# Patient Record
Sex: Male | Born: 1975 | Race: White | Hispanic: No | Marital: Married | Smoking: Never smoker
Health system: Southern US, Community
[De-identification: ages and names within clinical notes are randomized; demographics above are authoritative.]

## PROBLEM LIST (undated history)

## (undated) DIAGNOSIS — N200 Calculus of kidney: Secondary | ICD-10-CM

## (undated) DIAGNOSIS — G4730 Sleep apnea, unspecified: Secondary | ICD-10-CM

## (undated) DIAGNOSIS — Z87442 Personal history of urinary calculi: Secondary | ICD-10-CM

## (undated) DIAGNOSIS — T7840XA Allergy, unspecified, initial encounter: Secondary | ICD-10-CM

## (undated) DIAGNOSIS — I10 Essential (primary) hypertension: Secondary | ICD-10-CM

## (undated) DIAGNOSIS — E119 Type 2 diabetes mellitus without complications: Secondary | ICD-10-CM

## (undated) DIAGNOSIS — J189 Pneumonia, unspecified organism: Secondary | ICD-10-CM

## (undated) HISTORY — PX: MRI: SHX5353

## (undated) HISTORY — PX: WISDOM TOOTH EXTRACTION: SHX21

## (undated) HISTORY — DX: Allergy, unspecified, initial encounter: T78.40XA

## (undated) HISTORY — DX: Type 2 diabetes mellitus without complications: E11.9

---

## 1997-12-10 ENCOUNTER — Encounter: Admission: RE | Admit: 1997-12-10 | Discharge: 1998-03-10 | Payer: Self-pay | Admitting: Internal Medicine

## 1998-06-18 HISTORY — PX: OTHER SURGICAL HISTORY: SHX169

## 2000-02-11 ENCOUNTER — Emergency Department (HOSPITAL_COMMUNITY): Admission: EM | Admit: 2000-02-11 | Discharge: 2000-02-11 | Payer: Self-pay | Admitting: Internal Medicine

## 2001-06-18 HISTORY — PX: ANKLE SURGERY: SHX546

## 2001-11-13 ENCOUNTER — Inpatient Hospital Stay (HOSPITAL_COMMUNITY): Admission: EM | Admit: 2001-11-13 | Discharge: 2001-11-15 | Payer: Self-pay | Admitting: Emergency Medicine

## 2001-11-13 ENCOUNTER — Encounter: Payer: Self-pay | Admitting: Orthopedic Surgery

## 2001-11-13 ENCOUNTER — Encounter: Payer: Self-pay | Admitting: Emergency Medicine

## 2002-01-24 ENCOUNTER — Emergency Department (HOSPITAL_COMMUNITY): Admission: EM | Admit: 2002-01-24 | Discharge: 2002-01-24 | Payer: Self-pay | Admitting: Emergency Medicine

## 2002-01-24 ENCOUNTER — Encounter: Payer: Self-pay | Admitting: *Deleted

## 2002-02-13 ENCOUNTER — Ambulatory Visit: Admission: RE | Admit: 2002-02-13 | Discharge: 2002-02-13 | Payer: Self-pay | Admitting: Orthopedic Surgery

## 2002-04-20 ENCOUNTER — Ambulatory Visit (HOSPITAL_COMMUNITY): Admission: RE | Admit: 2002-04-20 | Discharge: 2002-04-20 | Payer: Self-pay | Admitting: *Deleted

## 2002-04-20 ENCOUNTER — Encounter: Payer: Self-pay | Admitting: *Deleted

## 2002-06-04 ENCOUNTER — Encounter: Payer: Self-pay | Admitting: Urology

## 2002-06-04 ENCOUNTER — Ambulatory Visit (HOSPITAL_BASED_OUTPATIENT_CLINIC_OR_DEPARTMENT_OTHER): Admission: RE | Admit: 2002-06-04 | Discharge: 2002-06-04 | Payer: Self-pay | Admitting: Urology

## 2003-06-23 ENCOUNTER — Emergency Department (HOSPITAL_COMMUNITY): Admission: AD | Admit: 2003-06-23 | Discharge: 2003-06-23 | Payer: Self-pay | Admitting: Emergency Medicine

## 2003-12-10 ENCOUNTER — Emergency Department (HOSPITAL_COMMUNITY): Admission: EM | Admit: 2003-12-10 | Discharge: 2003-12-10 | Payer: Self-pay | Admitting: Emergency Medicine

## 2005-06-18 HISTORY — PX: OTHER SURGICAL HISTORY: SHX169

## 2005-08-29 ENCOUNTER — Encounter: Admission: RE | Admit: 2005-08-29 | Discharge: 2005-09-20 | Payer: Self-pay | Admitting: Family Medicine

## 2006-09-01 ENCOUNTER — Emergency Department (HOSPITAL_COMMUNITY): Admission: EM | Admit: 2006-09-01 | Discharge: 2006-09-01 | Payer: Self-pay | Admitting: Family Medicine

## 2008-05-29 ENCOUNTER — Emergency Department (HOSPITAL_COMMUNITY): Admission: EM | Admit: 2008-05-29 | Discharge: 2008-05-29 | Payer: Self-pay | Admitting: Emergency Medicine

## 2008-06-18 HISTORY — PX: LITHOTRIPSY: SUR834

## 2009-04-10 ENCOUNTER — Emergency Department (HOSPITAL_COMMUNITY): Admission: EM | Admit: 2009-04-10 | Discharge: 2009-04-10 | Payer: Self-pay | Admitting: Family Medicine

## 2009-04-10 ENCOUNTER — Emergency Department (HOSPITAL_COMMUNITY): Admission: EM | Admit: 2009-04-10 | Discharge: 2009-04-10 | Payer: Self-pay | Admitting: Emergency Medicine

## 2009-08-03 ENCOUNTER — Emergency Department (HOSPITAL_COMMUNITY): Admission: EM | Admit: 2009-08-03 | Discharge: 2009-08-03 | Payer: Self-pay | Admitting: Emergency Medicine

## 2010-09-06 LAB — URINALYSIS, ROUTINE W REFLEX MICROSCOPIC
Glucose, UA: NEGATIVE mg/dL
Ketones, ur: 15 mg/dL — AB
Specific Gravity, Urine: 1.025 (ref 1.005–1.030)
Urobilinogen, UA: 1 mg/dL (ref 0.0–1.0)

## 2010-09-06 LAB — URINE MICROSCOPIC-ADD ON

## 2010-11-03 NOTE — Consult Note (Signed)
[place]. [facility]  Patient:    Cody Sullivan, Cody Sullivan Visit Number: [id number] MRN: [id number]          Service Type: MED Location: 5000 [id number] Attending Physician:  Burnard Bunting Dictated by:   Cammy Copa, M.D. Admit Date:  11/13/2001                            Consultation Report  CONSULTATION REQUESTED BY:  Dr. Doug Sou.  CHIEF COMPLAINT:  Right ankle pain.  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  He reports right ankle pain.  Denies any other orthopedic complaints.  He last ate at 0900.  PAST MEDICAL HISTORY:  Unremarkable.  MEDICATIONS:  Currently, no medications.  ALLERGIES:  None.  PAST SURGICAL HISTORY:  Past surgical history is significant for nose surgery several years ago.  PHYSICAL EXAMINATION:  GENERAL:  He is alert and oriented x3, in moderate distress.  CHEST:  Clear to auscultation.  HEART:  Heart beat is regular rhythm.  ABDOMEN:  Exam is benign.  EXTREMITIES:  Bilateral upper extremities have full range of motion.  The left lower extremity has good hip, knee and ankle range of motion with palpable pedal pulse and intact sensation on the dorsal and plantar aspects of the foot.  Right lower extremity:  Knee has no effusion.  Right ankle has deformity.  Sensation is okay on the dorsal and plantar aspects of the foot, although slightly diminished dorsally.  The dorsalis pedis pulse is 1+/4.  X-RAY FINDINGS:  X-rays show trimalleolar ankle fracture on the right with syndesmotic disruption.  IMPRESSION:  Right trimalleolar ankle fracture.  PLAN:  ORIF, medial and lateral, with syndesmotic fixation.  Risks and benefits are discussed with the patient and his wife; primary risks include infection, nerve and vessel damage, bleeding, deep venous thrombosis, hardware failure, need for more surgery.  The patient understands the risks and benefits and will proceed  with surgery. Dictated by:   Cammy Copa, M.D. Attending Physician:  Burnard Bunting DD:  11/12/01 TD:  11/14/01 Job: [phone]

## 2010-11-03 NOTE — Op Note (Signed)
[place]. [facility]  Patient:    Cody Sullivan, Cody Sullivan Visit Number: [id number] MRN: [id number]          Service Type: MED Location: 5000 [id number] [id number] Attending Physician:  Burnard Bunting Dictated by:   Cammy Copa, M.D. Proc. Date: 11/13/01 Admit Date:  11/13/2001 Discharge Date: 11/15/2001                             Operative Report  PREOPERATIVE DIAGNOSIS:  Right ankle trimalleolar fracture.  POSTOPERATIVE DIAGNOSIS:  Right ankle trimalleolar fracture.  PROCEDURE:  Closed reduction of right ankle trimalleolar fracture dislocation with open reduction and internal fixation of fibular fracture and medial malleolar fracture.  SURGEON:  Cammy Copa, M.D.  ANESTHESIA:  General endotracheal.  ESTIMATED BLOOD LOSS:  25 cc.  DRAINS:  Hemovac x1 on the lateral side.  TOURNIQUET TIME:  2 hours 11 minutes at 300 mmHg. It should be noted that the tourniquet was requested to be decreased at approximately 1 hour and 30 minutes. However, it was not released until approximately 2 hours.  PROCEDURE IN DETAIL:  The patient was brought to the operating room where general endotracheal anesthesia was induced. Preoperative antibiotics were administered. The ankle was then closed reduced. This production was confirmed in the AP and lateral planes under fluoroscopy. Foot was then prepped with Duraprep solution and draped in a sterile manner. The leg was elevated in the same area of the Esmarch graft. The tourniquet was inflated. Lateral incision was then first made. Skin and subcutaneous tissues were sharply divided. Superficial peroneal nerve was identified, immobilized anteriorly. The fibula fracture itself was a long spiral oblique fracture with probable disruption of the syndesmosis that required a longer incision. The fracture was identified. The fracture edges were visualized and the fracture itself was reduced and maintained with clamps. Good reduction was  confirmed in the AP and lateral planes. Two lag screws were placed across the fracture with anatomic reduction achieved. This was then reinforced with a one-third locking tubular plate. A syndesmotic screw was also placed through one of the holes of the plate. This was a bicortical 3.5 mm screw. Following anatomic reduction of the lateral malleolus, the incision was irrigated and closed using 3-0 Vicryl to reapproximate fascia over the plate and to reapproximate the skin edges in inverted fashion followed by 3-0 nylon to reapproximate the skin edges. A drain was placed within the lateral incision. This time a medial incision was made, and the medial malleolus was anatomically reduced and then secured with two 4.0 mm cannulated 50 mm partially threaded cancellous screws. Again, anatomic fixation was achieved. There was a posterior malleolar fragment which was less than 1 mm displaced thus, fixation of the fragment was not necessary. At this time tourniquet was released. Bleeding points encountered with ______ electrocautery. Medial side was closed using interrupted inverted 3-0 Vicryl and then followed by 3-0 nylon in the skin edges. Bulky posterior splint was applied. The patient tolerated the procedure without immediate complications. He was transferred to the recovery room in stable condition. Dictated by:   Cammy Copa, M.D. Attending Physician:  Burnard Bunting DD:  11/16/01 TD:  11/17/01 Job: [id number]

## 2011-03-22 LAB — URINALYSIS, ROUTINE W REFLEX MICROSCOPIC
Ketones, ur: 15 mg/dL — AB
Protein, ur: 100 mg/dL — AB

## 2011-03-22 LAB — CBC
HCT: 41.8 % (ref 39.0–52.0)
Hemoglobin: 14 g/dL (ref 13.0–17.0)
Platelets: 264 10*3/uL (ref 150–400)
RBC: 4.64 MIL/uL (ref 4.22–5.81)
RDW: 13.5 % (ref 11.5–15.5)
WBC: 10.5 10*3/uL (ref 4.0–10.5)

## 2011-03-22 LAB — BASIC METABOLIC PANEL
GFR calc Af Amer: >60 mL/min (ref >60)
GFR calc non Af Amer: >60 mL/min (ref >60)
Glucose, Bld: 104 mg/dL — ABNORMAL HIGH (ref 70–99)
Potassium: 3.9 mEq/L (ref 3.5–5.1)
Sodium: 135 mEq/L (ref 135–145)

## 2011-03-22 LAB — DIFFERENTIAL
Lymphs Abs: 2.6 10*3/uL (ref 0.7–4.0)
Monocytes Absolute: 0.6 10*3/uL (ref 0.1–1.0)
Monocytes Relative: 5 % (ref 3–12)

## 2011-03-22 LAB — URINE MICROSCOPIC-ADD ON

## 2012-01-11 ENCOUNTER — Ambulatory Visit: Payer: Self-pay | Admitting: *Deleted

## 2012-03-26 DIAGNOSIS — E66812 Obesity, class 2: Secondary | ICD-10-CM | POA: Insufficient documentation

## 2012-03-26 DIAGNOSIS — E669 Obesity, unspecified: Secondary | ICD-10-CM | POA: Insufficient documentation

## 2012-11-19 DIAGNOSIS — L237 Allergic contact dermatitis due to plants, except food: Secondary | ICD-10-CM | POA: Insufficient documentation

## 2013-02-25 DIAGNOSIS — I10 Essential (primary) hypertension: Secondary | ICD-10-CM | POA: Insufficient documentation

## 2013-02-25 DIAGNOSIS — E785 Hyperlipidemia, unspecified: Secondary | ICD-10-CM | POA: Insufficient documentation

## 2013-09-05 ENCOUNTER — Emergency Department: Payer: Self-pay | Admitting: Internal Medicine

## 2014-12-22 ENCOUNTER — Ambulatory Visit (INDEPENDENT_AMBULATORY_CARE_PROVIDER_SITE_OTHER): Payer: BLUE CROSS/BLUE SHIELD | Admitting: Family Medicine

## 2014-12-22 VITALS — BP 154/86 | HR 80 | Temp 97.8°F | Resp 18 | Ht 69.5 in | Wt 289.0 lb

## 2014-12-22 DIAGNOSIS — R51 Headache: Secondary | ICD-10-CM

## 2014-12-22 DIAGNOSIS — T679XXA Effect of heat and light, unspecified, initial encounter: Secondary | ICD-10-CM

## 2014-12-22 DIAGNOSIS — R42 Dizziness and giddiness: Secondary | ICD-10-CM

## 2014-12-22 DIAGNOSIS — R079 Chest pain, unspecified: Secondary | ICD-10-CM | POA: Diagnosis not present

## 2014-12-22 DIAGNOSIS — R519 Headache, unspecified: Secondary | ICD-10-CM

## 2014-12-22 DIAGNOSIS — G4733 Obstructive sleep apnea (adult) (pediatric): Secondary | ICD-10-CM

## 2014-12-22 DIAGNOSIS — E669 Obesity, unspecified: Secondary | ICD-10-CM | POA: Diagnosis not present

## 2014-12-22 DIAGNOSIS — R030 Elevated blood-pressure reading, without diagnosis of hypertension: Secondary | ICD-10-CM | POA: Diagnosis not present

## 2014-12-22 LAB — POCT CBC
GRANULOCYTE PERCENT: 69.4 % (ref 37–80)
HCT, POC: 40.9 % — AB (ref 43.5–53.7)
HEMOGLOBIN: 13.7 g/dL — AB (ref 14.1–18.1)
Lymph, poc: 2.9 (ref 0.6–3.4)
MCH: 28.5 pg (ref 27–31.2)
MCHC: 33.5 g/dL (ref 31.8–35.4)
MCV: 85.1 fL (ref 80–97)
MID (cbc): 0.1 (ref 0–0.9)
MPV: 7.5 fL (ref 0–99.8)
PLATELET COUNT, POC: 247 10*3/uL (ref 142–424)
POC GRANULOCYTE: 6.8 (ref 2–6.9)
POC LYMPH PERCENT: 29.6 %L (ref 10–50)
POC MID %: 1 % (ref 0–12)
RBC: 4.81 M/uL (ref 4.69–6.13)
RDW, POC: 13.9 %
WBC: 9.8 10*3/uL (ref 4.6–10.2)

## 2014-12-22 LAB — COMPLETE METABOLIC PANEL WITH GFR
ALBUMIN: 4.2 g/dL (ref 3.5–5.2)
ALT: 54 U/L — ABNORMAL HIGH (ref 0–53)
AST: 26 U/L (ref 0–37)
Alkaline Phosphatase: 71 U/L (ref 39–117)
BUN: 12 mg/dL (ref 6–23)
CALCIUM: 9.3 mg/dL (ref 8.4–10.5)
CHLORIDE: 102 meq/L (ref 96–112)
CO2: 25 mEq/L (ref 19–32)
Creat: 0.7 mg/dL (ref 0.50–1.35)
GFR, Est African American: >89 mL/min
GFR, Est Non African American: >89 mL/min
GLUCOSE: 98 mg/dL (ref 70–99)
POTASSIUM: 3.9 meq/L (ref 3.5–5.3)
SODIUM: 140 meq/L (ref 135–145)
TOTAL PROTEIN: 7.1 g/dL (ref 6.0–8.3)
Total Bilirubin: 0.5 mg/dL (ref 0.2–1.2)

## 2014-12-22 LAB — POCT GLYCOSYLATED HEMOGLOBIN (HGB A1C): Hemoglobin A1C: 6.2

## 2014-12-22 LAB — POCT SEDIMENTATION RATE: POCT SED RATE: 38 mm/hr — AB (ref 0–22)

## 2014-12-22 MED ORDER — KETOROLAC TROMETHAMINE 60 MG/2ML IM SOLN
60.0000 mg | Freq: Once | INTRAMUSCULAR | Status: AC
  Administered 2014-12-22: 60 mg via INTRAMUSCULAR

## 2014-12-22 MED ORDER — TRAMADOL HCL 50 MG PO TABS: 50.0000 mg | ORAL_TABLET | Freq: Three times a day (TID) | ORAL | Status: DC | PRN

## 2014-12-22 MED ORDER — METAXALONE 800 MG PO TABS: 800.0000 mg | ORAL_TABLET | Freq: Three times a day (TID) | ORAL | Status: DC

## 2014-12-22 MED ORDER — LISINOPRIL-HYDROCHLOROTHIAZIDE 10-12.5 MG PO TABS: 1.0000 | ORAL_TABLET | Freq: Every day | ORAL | Status: DC

## 2014-12-22 NOTE — Patient Instructions (Signed)
Take tramadol 50 mg 1 every 6 hours as needed for headache  Take Tylenol 1000 mg maximum of 3 times daily as needed for headache  The combination of Tylenol and tramadol actually worked very well together in making a more potent pain relief combination  Take Skelaxin (metaxalone) one pill 3 times daily only if needed for muscle relaxant for neck and temple pain  Take lisinopril HCT 10/12.5 one daily for blood pressure  Stay off work through the rest of this week and return Saturday or Sunday for a recheck of your blood pressure. We need to decide if you are doing well enough to feel like he should be driving.  Contact the cardiology office and get an appointment as soon as possible to follow-up on the history of intermittent chest pains over the past month.  Make sure you follow through with getting the sleep apnea testing done  Work hard on weight loss. This should be done by getting some regular daily exercise when you're feeling better, and by working hard on decreasing your intake as discussed.  Drink plenty of fluids each day, preferably water You should have your blood sugar testing refill and up on in about 3-4 months. By watching what you aren't eating you should've been able to lose 10 or more pounds by that time.

## 2014-12-22 NOTE — Progress Notes (Signed)
Subjective:  Patient ID: Cody Sullivan, male    DOB: Feb 16, 1976  Age: 39 y.o. MRN: [id number]  39 year old who presents with a headache for the last couple of days. He did go to work today but had to leave. 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 The headache came on a couple days ago, got bad yesterday, and has persisted. He has not taken any medications for this. He does not typically have headaches. He has had photophobia with it. Today he got lightheaded, but he was working out where it is hot. He has been having chest pains over the past month. They extend from the substernal and left chest area up into the left shoulder. He was seen at [place] and evaluated and told that it appeared to be a musculoskeletal chest pain. He has gotten the pain at times when he does vigorous physical labor such as lifting full  lines at work. He has a history of sleep apnea although he is not yet been diagnosed. He is scheduled to see somebody later this month regarding that. His wife reports apneic spells and extremely heavy snoring. He does not smoke, drink, or use drugs. He does not do regular physical exercise. He is married with a couple of children. He is not on any regular medications.  Recent visits were to [facility] and we do not have access to those records  His wife feels like he was mumbling and somewhat incoherent today when she talked to him on the phone, and again here in the office that he was not alert and with a program. The patient does not feel like she's been having any significant confusion. Objective:   White male laying on his back on the exam table covering the eyes to protect them from the like. His TMs are normal. Eyes PERRLA. EOMs intact. Pupils were a little constricted I cannot get a great view of his fundi, but red reflex was symmetrical bilaterally. Throat was clear. Neck supple without nodes or thyromegaly. No carotid bruits. Chest is clear to  auscultation. Heart regular without murmurs gallops or arrhythmias. His neck circumference was 18-3/4 inches. Abdomen is soft without mass or tenderness. He is obese. Extremities without edema.  Assessment & Plan:   Assessment:  Probable migraine headache Heat side effects Chest pains Obesity Probable sleep apnea Elevated blood pressure, nonhypertensive Lightheadedness  Plan: EKG CBC, sedimentation rate, A1c, C met Toradol 60 mg IM for headache pain  Results for orders placed or performed in visit on 12/22/14  POCT CBC  Result Value Ref Range   WBC 9.8 4.6 - 10.2 K/uL   Lymph, poc 2.9 0.6 - 3.4   POC LYMPH PERCENT 29.6 10 - 50 %L   MID (cbc) 0.1 0 - 0.9   POC MID % 1.0 0 - 12 %M   POC Granulocyte 6.8 2 - 6.9   Granulocyte percent 69.4 37 - 80 %G   RBC 4.81 4.69 - 6.13 M/uL   Hemoglobin 13.7 (A) 14.1 - 18.1 g/dL   HCT, POC 40.9 (A) 43.5 - 53.7 %   MCV 85.1 80 - 97 fL   MCH, POC 28.5 27 - 31.2 pg   MCHC 33.5 31.8 - 35.4 g/dL   RDW, POC 13.9 %   Platelet Count, POC 247 142 - 424 K/uL   MPV 7.5 0 - 99.8 fL  POCT glycosylated hemoglobin (Hb A1C)  Result Value Ref Range   Hemoglobin A1C 6.2  There are no Patient Instructions on file for this visit.   Shirline Kendle, MD 12/22/2014

## 2016-01-20 ENCOUNTER — Emergency Department (HOSPITAL_COMMUNITY): Payer: BLUE CROSS/BLUE SHIELD

## 2016-01-20 ENCOUNTER — Encounter (HOSPITAL_COMMUNITY): Payer: Self-pay | Admitting: Emergency Medicine

## 2016-01-20 ENCOUNTER — Emergency Department (HOSPITAL_COMMUNITY)
Admission: EM | Admit: 2016-01-20 | Discharge: 2016-01-20 | Disposition: A | Discharge status: Home / Self Care | Payer: BLUE CROSS/BLUE SHIELD | Attending: Emergency Medicine | Admitting: Emergency Medicine

## 2016-01-20 DIAGNOSIS — R1031 Right lower quadrant pain: Secondary | ICD-10-CM | POA: Diagnosis present

## 2016-01-20 DIAGNOSIS — N201 Calculus of ureter: Secondary | ICD-10-CM | POA: Diagnosis not present

## 2016-01-20 DIAGNOSIS — Z79899 Other long term (current) drug therapy: Secondary | ICD-10-CM | POA: Insufficient documentation

## 2016-01-20 DIAGNOSIS — R112 Nausea with vomiting, unspecified: Secondary | ICD-10-CM | POA: Diagnosis not present

## 2016-01-20 LAB — URINALYSIS, ROUTINE W REFLEX MICROSCOPIC
BILIRUBIN URINE: NEGATIVE
Glucose, UA: NEGATIVE mg/dL
KETONES UR: NEGATIVE mg/dL
LEUKOCYTES UA: NEGATIVE
NITRITE: NEGATIVE
PH: 6.5 (ref 5.0–8.0)
PROTEIN: NEGATIVE mg/dL
Specific Gravity, Urine: 1.023 (ref 1.005–1.030)

## 2016-01-20 LAB — LIPASE, BLOOD: LIPASE: 32 U/L (ref 11–51)

## 2016-01-20 LAB — CBC
HCT: 42.8 % (ref 39.0–52.0)
Hemoglobin: 14.4 g/dL (ref 13.0–17.0)
MCH: 29.5 pg (ref 26.0–34.0)
MCHC: 33.6 g/dL (ref 30.0–36.0)
MCV: 87.7 fL (ref 78.0–100.0)
PLATELETS: 258 10*3/uL (ref 150–400)
RBC: 4.88 MIL/uL (ref 4.22–5.81)
RDW: 13.9 % (ref 11.5–15.5)
WBC: 14.7 10*3/uL — ABNORMAL HIGH (ref 4.0–10.5)

## 2016-01-20 LAB — COMPREHENSIVE METABOLIC PANEL
ALBUMIN: 4.6 g/dL (ref 3.5–5.0)
ALK PHOS: 65 U/L (ref 38–126)
ALT: 27 U/L (ref 17–63)
AST: 27 U/L (ref 15–41)
Anion gap: 9 (ref 5–15)
BILIRUBIN TOTAL: 0.5 mg/dL (ref 0.3–1.2)
BUN: 14 mg/dL (ref 6–20)
CALCIUM: 9 mg/dL (ref 8.9–10.3)
CO2: 23 mmol/L (ref 22–32)
CREATININE: 0.85 mg/dL (ref 0.61–1.24)
Chloride: 106 mmol/L (ref 101–111)
GFR calc Af Amer: >60 mL/min (ref >60)
GFR calc non Af Amer: >60 mL/min (ref >60)
GLUCOSE: 109 mg/dL — AB (ref 65–99)
Potassium: 4 mmol/L (ref 3.5–5.1)
SODIUM: 138 mmol/L (ref 135–145)
Total Protein: 8.2 g/dL — ABNORMAL HIGH (ref 6.5–8.1)

## 2016-01-20 LAB — URINE MICROSCOPIC-ADD ON
Bacteria, UA: NONE SEEN
SQUAMOUS EPITHELIAL / LPF: NONE SEEN

## 2016-01-20 MED ORDER — ONDANSETRON HCL 4 MG/2ML IJ SOLN
4.0000 mg | Freq: Once | INTRAMUSCULAR | Status: AC
  Administered 2016-01-20: 4 mg via INTRAVENOUS
  Filled 2016-01-20: qty 2

## 2016-01-20 MED ORDER — OXYCODONE-ACETAMINOPHEN 5-325 MG PO TABS
2.0000 | ORAL_TABLET | Freq: Once | ORAL | Status: AC
  Administered 2016-01-20: 2 via ORAL
  Filled 2016-01-20: qty 2

## 2016-01-20 MED ORDER — KETOROLAC TROMETHAMINE 30 MG/ML IJ SOLN
30.0000 mg | Freq: Once | INTRAMUSCULAR | Status: AC
  Administered 2016-01-20: 30 mg via INTRAVENOUS
  Filled 2016-01-20: qty 1

## 2016-01-20 MED ORDER — HYDROMORPHONE HCL 1 MG/ML IJ SOLN
1.0000 mg | Freq: Once | INTRAMUSCULAR | Status: AC
  Administered 2016-01-20: 1 mg via INTRAVENOUS
  Filled 2016-01-20: qty 1

## 2016-01-20 MED ORDER — MORPHINE SULFATE (PF) 4 MG/ML IV SOLN
4.0000 mg | Freq: Once | INTRAVENOUS | Status: AC
  Administered 2016-01-20: 4 mg via INTRAVENOUS
  Filled 2016-01-20: qty 1

## 2016-01-20 MED ORDER — PROMETHAZINE HCL 25 MG PO TABS: 25.0000 mg | ORAL_TABLET | Freq: Four times a day (QID) | ORAL | 0 refills | Status: DC | PRN

## 2016-01-20 MED ORDER — TAMSULOSIN HCL 0.4 MG PO CAPS: 0.4000 mg | ORAL_CAPSULE | Freq: Every day | ORAL | 0 refills | Status: DC

## 2016-01-20 MED ORDER — OXYCODONE-ACETAMINOPHEN 5-325 MG PO TABS: 1.0000 | ORAL_TABLET | ORAL | 0 refills | Status: DC | PRN

## 2016-01-20 NOTE — ED Notes (Signed)
Pt denies cp

## 2016-01-20 NOTE — Progress Notes (Signed)
Patient confirms  his pcp is Dr. Leighton Ruff.  System updated,

## 2016-01-20 NOTE — ED Triage Notes (Signed)
Pt from work, c/o abdominal pain since 5 am this morning. Pt c/o N/V, denies diarrhea. C/o yellow emesis. Pt received 151mcg of fentanyl and 4 mg zofran. Pain 8/10. Pain is sharp on R lower side that radiates to the back. Pt has hx of kidney stones but pt sts that this pain is different than his usual kidney stone pain. A&Ox4.

## 2016-01-20 NOTE — ED Provider Notes (Signed)
[facility] DEPT Provider Note   CSN: [id number] Arrival date & time: 01/20/16  1359  First Provider Contact:  First MD Initiated Contact with Patient 01/20/16 1455        History   Chief Complaint Chief Complaint  Patient presents with  . Abdominal Pain  . Flank Pain    HPI Cody Sullivan is a 40 y.o. male.  40 year old male with HTN and distant history of kidney stones who presents with right abdominal and right back pain. The patient states that this morning around 5 AM, he began having right lower abdominal pain that wraps around his side to his right back. The pain is constant, severe, and nothing makes it better or worse. He has had associated nausea and vomiting with yellow emesis. No diarrhea. He states that this pain is different from his previous kidney stone which was at least 10 years ago. No urinary symptoms or fevers. No chest pain or shortness of breath. No scrotal swelling or testicular pain.   The history is provided by the patient.  Abdominal Pain    Flank Pain  Associated symptoms include abdominal pain.    Past Medical History:  Diagnosis Date  . Allergy     There are no active problems to display for this patient.   Past Surgical History:  Procedure Laterality Date  . ANKLE SURGERY  2003   right   . nose fracture  2000       Home Medications    Prior to Admission medications   Medication Sig Start Date End Date Taking? Authorizing Provider  lisinopril-hydrochlorothiazide (PRINZIDE,ZESTORETIC) 10-12.5 MG per tablet Take 1 tablet by mouth daily. 12/22/14  Yes Posey Boyer, MD  oxyCODONE-acetaminophen (PERCOCET) 5-325 MG tablet Take 1-2 tablets by mouth every 4 (four) hours as needed for severe pain. 01/20/16   Sharlett Iles, MD  promethazine (PHENERGAN) 25 MG tablet Take 1 tablet (25 mg total) by mouth every 6 (six) hours as needed for nausea or vomiting. 01/20/16   Sharlett Iles, MD  tamsulosin (FLOMAX) 0.4 MG CAPS capsule Take 1  capsule (0.4 mg total) by mouth daily. 01/20/16   Sharlett Iles, MD    Family History No family history on file.  Social History Social History  Substance Use Topics  . Smoking status: Never Smoker  . Smokeless tobacco: Not on file  . Alcohol use No     Allergies   Review of patient's allergies indicates no known allergies.   Review of Systems Review of Systems  Gastrointestinal: Positive for abdominal pain.  Genitourinary: Positive for flank pain.   10 Systems reviewed and are negative for acute change except as noted in the HPI.   Physical Exam Updated Vital Signs BP 111/69 (BP Location: Left Arm)   Pulse (!) 54   Temp 97.5 F (36.4 C) (Oral)   Resp 18   SpO2 95%   Physical Exam  Constitutional: He is oriented to person, place, and time. He appears well-developed and well-nourished. No distress.  uncomfortable  HENT:  Head: Normocephalic and atraumatic.  Moist mucous membranes  Eyes: Conjunctivae are normal. Pupils are equal, round, and reactive to light.  Neck: Neck supple.  Cardiovascular: Normal rate, regular rhythm and normal heart sounds.   No murmur heard. Pulmonary/Chest: Effort normal and breath sounds normal.  Abdominal: Soft. Bowel sounds are normal. He exhibits no distension. There is no tenderness.  Genitourinary:  Genitourinary Comments: R CVA tenderness  Musculoskeletal: He exhibits no edema.  Neurological: He is alert and oriented to person, place, and time.  Fluent speech  Skin: Skin is warm and dry.  Psychiatric: He has a normal mood and affect. Judgment normal.  Nursing note and vitals reviewed.    ED Treatments / Results  Labs (all labs ordered are listed, but only abnormal results are displayed) Labs Reviewed  URINALYSIS, ROUTINE W REFLEX MICROSCOPIC (NOT AT [facility]) - Abnormal; Notable for the following:       Result Value   Hgb urine dipstick MODERATE (*)    All other components within normal limits  COMPREHENSIVE METABOLIC  PANEL - Abnormal; Notable for the following:    Glucose, Bld 109 (*)    Total Protein 8.2 (*)    All other components within normal limits  CBC - Abnormal; Notable for the following:    WBC 14.7 (*)    All other components within normal limits  LIPASE, BLOOD  URINE MICROSCOPIC-ADD ON    EKG  EKG Interpretation None       Radiology Ct Renal Stone Study  Result Date: 01/20/2016 CLINICAL DATA:  History of renal stones.  Right-sided pain. EXAM: CT ABDOMEN AND PELVIS WITHOUT CONTRAST TECHNIQUE: Multidetector CT imaging of the abdomen and pelvis was performed following the standard protocol without IV contrast. COMPARISON:  May 18, 2008 FINDINGS: A small portion of the lung bases and upper abdomen were not included on this study. Within visualize limits, the lung bases are normal. No free air or free fluid. No renal stones. There is moderate hydronephrosis and perinephric stranding on the right due to a stone in the upper right ureter measuring 4.8 x 4.2 x 12 mm in AP, transverse, and craniocaudal dimensions. Distal to this stone, the right ureter is decompressed with no other stones or ureterectasis. No hydronephrosis or perinephric stranding on the left. No left-sided ureteral stones. Visualized portions of the liver are normal. Visualized portions of the spleen are unremarkable. The adrenal glands and pancreas are normal. Visualized portions of the stomach and small bowel are normal. Colonic diverticuli are identified. The remainder of the colon is normal. No evidence of appendicitis. There is a fat containing umbilical hernia. The abdominal aorta is non aneurysmal. No adenopathy. The pelvis demonstrates no adenopathy or mass. The bladder, prostate, and seminal vesicle are normal. No acute bony abnormalities. IMPRESSION: 1. Obstructing stone in the proximal right ureter as noted above measuring 4.8 x 4.2 x 12 mm in AP, transverse, and craniocaudal dimension. 2. No other acute abnormalities.  Electronically Signed   By: Dorise Bullion III M.D   On: 01/20/2016 16:02    Procedures Procedures (including critical care time)  Medications Ordered in ED Medications  ondansetron [facility]) injection 4 mg (4 mg Intravenous Given 01/20/16 1609)  morphine 4 MG/ML injection 4 mg (4 mg Intravenous Given 01/20/16 1609)  HYDROmorphone (DILAUDID) injection 1 mg (1 mg Intravenous Given 01/20/16 1727)  ketorolac (TORADOL) 30 MG/ML injection 30 mg (30 mg Intravenous Given 01/20/16 1734)  oxyCODONE-acetaminophen (PERCOCET/ROXICET) 5-325 MG per tablet 2 tablet (2 tablets Oral Given 01/20/16 1905)     Initial Impression / Assessment and Plan / ED Course  I have reviewed the triage vital signs and the nursing notes.  Pertinent labs that were available during my care of the patient were reviewed by me and considered in my medical decision making (see chart for details).  Clinical Course  Comment By Time  Labs show WBC 14.7, UA with blood, no signs of infection [place],  MD 08/04 1548  Proximal R ureteral stone 4.40mm in largest diameter on CT; no evidence of appendicitis Sharlett Iles, MD 08/04 1614   Pt w/ 1 day of RLQ pain and R back pain assoc w/ N/V. He was uncomfortable but nontoxic on exam with normal vital signs. He pointed to right lower quadrant as source of pain but had no abdominal tenderness. He did have right CVA tenderness. Gave the patient morphine and Zofran and obtained above labs. His labs were notable for mild leukocytosis, UA containing blood without any signs of infection. Obtained CT of abdomen and pelvis to rule out obstructing stone or appendicitis given the location of his pain.  CT showed obstructing stone in proximal right ureter, 4.8 x 4.2 x 12 mm in dimension. Discussed with urologist, Dr. Risa Grill, who recommended attempting pain control and discharge if patient able to do so. After receiving Dilaudid and Toradol, the patient was much more comfortable and and  desired to go home for trial of passage. Gave the patient Percocet and observed, during which time he was able to tolerate Sprite and remained comfortable. Provided w/ percocet, flomax, and phenergan to use at home as well as strainer. Per Dr. Risa Grill, instructed to contact clinic on Monday for close follow up as he may require lithotripsy next week. The patient voiced understanding of this plan. Extensively reviewed return precautions including intractable pain, fever, or intractable vomiting. Patient voiced understanding and was discharged in satisfactory condition.  Final Clinical Impressions(s) / ED Diagnoses   Final diagnoses:  Right ureteral stone  Non-intractable vomiting with nausea, vomiting of unspecified type    New Prescriptions Discharge Medication List as of 01/20/2016  8:47 PM    START taking these medications   Details  oxyCODONE-acetaminophen (PERCOCET) 5-325 MG tablet Take 1-2 tablets by mouth every 4 (four) hours as needed for severe pain., Starting Fri 01/20/2016, Print    promethazine (PHENERGAN) 25 MG tablet Take 1 tablet (25 mg total) by mouth every 6 (six) hours as needed for nausea or vomiting., Starting Fri 01/20/2016, Print    tamsulosin (FLOMAX) 0.4 MG CAPS capsule Take 1 capsule (0.4 mg total) by mouth daily., Starting Fri 01/20/2016, Print         Sharlett Iles, MD 01/21/16 [phone]

## 2016-01-24 ENCOUNTER — Encounter (HOSPITAL_COMMUNITY): Payer: Self-pay

## 2016-01-24 ENCOUNTER — Other Ambulatory Visit: Payer: Self-pay | Admitting: Urology

## 2016-01-25 NOTE — H&P (Signed)
CC: I have ureteral stone.  HPI: Cody Sullivan is a 40 year-old male patient who is here for ureteral stone.  The problem is on the right side. He first stated noticing pain on 01/20/2016. This is not his first kidney stone. He is currently having flank pain. He denies having back pain, groin pain, nausea, vomiting, fever, and chills. Pain is occuring on the right side.   He has had eswl for treatment of his stones in the past.   Cody Sullivan is a former patient of Dr. Aldean Ast with a history of stones. He had the onset Friday of Nausea and vomiting with severe right flank pain. He had no gross hematuria but had microhematuria in the ER and was found to have a stone in the right proximal ureter that was 5 x 4 x 62mm. He is still sore today. He has some constant urgency.     ALLERGIES: No Allergies    MEDICATIONS: Tamsulosin Hcl 0.4 mg capsule, ext release 24 hr 1 capsule PO Daily  Lisinopril-Hydrochlorothiazide 10 mg-12.5 mg tablet 1 tablet PO Daily  Oxycodone-Acetaminophen 5 mg-325 mg tablet 1 tablet PO Daily  Promethazine Hcl 25 mg tablet 1 tablet PO Daily     GU PSH: Renal ESWL, Right - about 2005      PSH Notes: Ankle Repair, Nose Surgery   NON-GU PSH: None   GU PMH: Other microscopic hematuria, Microscopic Hematuria - 2014 Personal Hx urinary calculi, Nephrolithiasis - 2014      PMH Notes:  1898-06-18 00:00:00 - Note: Normal Routine History And Physical Adult  2007-11-07 10:00:41 - Note: Back Pain  2007-11-07 12:18:20 - Note: Nephrolithiasis Of Both Kidneys  CPAP use   NON-GU PMH: Personal history of other diseases of the circulatory system, History of hypertension - 2014 Personal history of other endocrine, nutritional and metabolic disease, History of hypercholesterolemia - 2014 Essential (primary) hypertension Sleep apnea, unspecified    FAMILY HISTORY: Family Health Status - Father's Age - Runs In Family Family Health Status - Mother's Age - Runs In Family Family Health  Status Number - Runs In Family Hypertension - Father Tuberculosis - Grandfather   SOCIAL HISTORY: Marital Status: Married Current Smoking Status: Patient has never smoked.  Has never drank.  Does not drink caffeine. Patient's occupation Tourist information centre manager.     Notes: Caffeine Use, Marital History - Currently Married, Tobacco Use, Alcohol Use, Occupation: 1 son 2 daughters   REVIEW OF SYSTEMS:    GU Review Male:   Patient reports frequent urination, burning/ pain with urination, and get up at night to urinate. Patient denies hard to postpone urination, leakage of urine, stream starts and stops, trouble starting your stream, have to strain to urinate , erection problems, and penile pain.  Gastrointestinal (Upper):   Patient reports nausea and vomiting. Patient denies indigestion/ heartburn.  Gastrointestinal (Lower):   Patient denies diarrhea and constipation.  Constitutional:   Patient denies fever, night sweats, weight loss, and fatigue.  Skin:   Patient reports itching and skin rash/ lesion.   Eyes:   Patient denies blurred vision and double vision.  Ears/ Nose/ Throat:   Patient reports sinus problems. Patient denies sore throat.  Hematologic/Lymphatic:   Patient denies swollen glands and easy bruising.  Cardiovascular:   Patient reports leg swelling and chest pains.   Respiratory:   Patient denies cough and shortness of breath.  Endocrine:   Patient denies excessive thirst.  Musculoskeletal:   Patient reports back pain. Patient denies joint pain.  Neurological:  Patient denies headaches and dizziness.  Psychologic:   Patient denies depression and anxiety.   VITAL SIGNS:      01/23/2016 02:02 PM  Weight 270 lb / 122.47 kg  Height 70 in / 177.8 cm  BP 122/85 mmHg  Pulse 66 /min  Temperature 97.9 F / 37 C  BMI 38.7 kg/m   MULTI-SYSTEM PHYSICAL EXAMINATION:    Constitutional: Well-nourished. No physical deformities. Normally developed. Good grooming.  Neck: Neck symmetrical, not  swollen. Normal tracheal position.  Respiratory: No labored breathing, no use of accessory muscles. cta  Cardiovascular: Normal temperature, normal extremity pulses, no swelling, no varicosities. RRR without murmur  Lymphatic: No enlargement, no tenderness of supraclavicular or cervical lymph nodes.  Skin: No paleness, no jaundice, no cyanosis. No lesion, no ulcer, no rash.  Neurologic / Psychiatric: Oriented to time, oriented to place, oriented to person. No depression, no anxiety, no agitation.  Gastrointestinal: Abdominal tenderness in RLQ, obese. No mass, no rigidity.   Eyes: Normal conjunctivae. Normal eyelids.  Ears, Nose, Mouth, and Throat: Left ear no scars, no lesions, no masses. Right ear no scars, no lesions, no masses. Nose no scars, no lesions, no masses. Normal hearing. Normal lips.  Musculoskeletal: Normal gait and station of head and neck.     PAST DATA REVIEWED:  Source Of History:  Patient  Urine Test Review:   Urinalysis  X-Ray Review: C.T. Stone Protocol: Reviewed Films. Reviewed Report.     PROCEDURES:         KUB - 74000  3 views of the abdomen are obtained. he has a 4x9 mm shadow consistent with the stone seen on CT between L4 and L5 on the right. There is some mild lumbar degenerative disease but no other stones, bone, gas or soft tissue abnormalities.       Right proximal stone that hasn't changed position.         Urinalysis - 81003 Dipstick Dipstick Cont'd  Specimen: Voided Bilirubin: Neg  Color: Yellow Ketones: Neg  Appearance: Clear Blood: Neg  Specific Gravity: 1.025 Protein: Neg  pH: 5.0 Urobilinogen: 0.2  Glucose: Neg Nitrites: Neg    Leukocyte Esterase: Neg    ASSESSMENT:      ICD-10 Details  1 GU:   Calculus Ureter - N20.1 Right, 4 x 5 x 49m right proximal stone.    PLAN:           Orders X-Rays: KUB Additional Views    KUB          Schedule Return Visit: ASAP - Schedule Surgery             Note: ESWL on Thursday           Document Letter(s):  Created for Patient: Clinical Summary         Notes:   He has a 4 x 5 x11mright proximal stone with intermittent symptoms.   I have reviewed the options including continued MET, ureteroscopy and ESWL and will tentatively get him set up for ESWL on Thursday if there is a time. I reviewed the risks of bleeding, infection, ureteral, renal and adjacent organ injury, failure of fragmentation or obstructive fragments requiring secondary procedures, thrombotic events and sedation risks.

## 2016-01-26 ENCOUNTER — Encounter (HOSPITAL_COMMUNITY)
Admission: RE | Disposition: A | Discharge status: Home / Self Care | Payer: Self-pay | Source: Ambulatory Visit | Attending: Urology

## 2016-01-26 ENCOUNTER — Ambulatory Visit (HOSPITAL_COMMUNITY)
Admission: RE | Admit: 2016-01-26 | Discharge: 2016-01-26 | Disposition: A | Discharge status: Home / Self Care | Payer: BLUE CROSS/BLUE SHIELD | Source: Ambulatory Visit | Attending: Urology | Admitting: Urology

## 2016-01-26 ENCOUNTER — Encounter (HOSPITAL_COMMUNITY): Payer: Self-pay

## 2016-01-26 ENCOUNTER — Ambulatory Visit (HOSPITAL_COMMUNITY): Payer: BLUE CROSS/BLUE SHIELD

## 2016-01-26 DIAGNOSIS — I10 Essential (primary) hypertension: Secondary | ICD-10-CM | POA: Diagnosis not present

## 2016-01-26 DIAGNOSIS — G4730 Sleep apnea, unspecified: Secondary | ICD-10-CM | POA: Insufficient documentation

## 2016-01-26 DIAGNOSIS — E669 Obesity, unspecified: Secondary | ICD-10-CM | POA: Diagnosis not present

## 2016-01-26 DIAGNOSIS — N201 Calculus of ureter: Secondary | ICD-10-CM | POA: Diagnosis present

## 2016-01-26 DIAGNOSIS — Z6837 Body mass index (BMI) 37.0-37.9, adult: Secondary | ICD-10-CM | POA: Diagnosis not present

## 2016-01-26 DIAGNOSIS — Z79899 Other long term (current) drug therapy: Secondary | ICD-10-CM | POA: Diagnosis not present

## 2016-01-26 HISTORY — DX: Calculus of kidney: N20.0

## 2016-01-26 HISTORY — DX: Sleep apnea, unspecified: G47.30

## 2016-01-26 HISTORY — DX: Essential (primary) hypertension: I10

## 2016-01-26 SURGERY — LITHOTRIPSY, ESWL
Anesthesia: LOCAL | Laterality: Right

## 2016-01-26 MED ORDER — ACETAMINOPHEN 325 MG PO TABS: 650.0000 mg | ORAL_TABLET | ORAL | Status: DC | PRN

## 2016-01-26 MED ORDER — OXYCODONE-ACETAMINOPHEN 5-325 MG PO TABS: 1.0000 | ORAL_TABLET | ORAL | 0 refills | Status: DC | PRN

## 2016-01-26 MED ORDER — SODIUM CHLORIDE 0.9% FLUSH: 3.0000 mL | INTRAVENOUS | Status: DC | PRN

## 2016-01-26 MED ORDER — FENTANYL CITRATE (PF) 100 MCG/2ML IJ SOLN: 25.0000 ug | INTRAMUSCULAR | Status: DC | PRN

## 2016-01-26 MED ORDER — ACETAMINOPHEN 650 MG RE SUPP
650.0000 mg | RECTAL | Status: DC | PRN
  Filled 2016-01-26: qty 1

## 2016-01-26 MED ORDER — SODIUM CHLORIDE 0.9% FLUSH: 3.0000 mL | Freq: Two times a day (BID) | INTRAVENOUS | Status: DC

## 2016-01-26 MED ORDER — OXYCODONE HCL 5 MG PO TABS: 5.0000 mg | ORAL_TABLET | ORAL | Status: DC | PRN

## 2016-01-26 MED ORDER — DIPHENHYDRAMINE HCL 25 MG PO CAPS
25.0000 mg | ORAL_CAPSULE | ORAL | Status: AC
  Administered 2016-01-26: 25 mg via ORAL
  Filled 2016-01-26: qty 1

## 2016-01-26 MED ORDER — SODIUM CHLORIDE 0.9 % IV SOLN
INTRAVENOUS | Status: DC
  Administered 2016-01-26: 14:00:00 via INTRAVENOUS

## 2016-01-26 MED ORDER — SODIUM CHLORIDE 0.9 % IV SOLN: 250.0000 mL | INTRAVENOUS | Status: DC | PRN

## 2016-01-26 MED ORDER — DIAZEPAM 5 MG PO TABS
10.0000 mg | ORAL_TABLET | ORAL | Status: AC
  Administered 2016-01-26: 10 mg via ORAL
  Filled 2016-01-26: qty 2

## 2016-01-26 MED ORDER — CIPROFLOXACIN HCL 500 MG PO TABS
500.0000 mg | ORAL_TABLET | ORAL | Status: AC
  Administered 2016-01-26: 500 mg via ORAL
  Filled 2016-01-26: qty 1

## 2016-01-26 NOTE — Interval H&P Note (Signed)
History and Physical Interval Note: no change in stone   01/26/2016 3:30 PM  [name]  has presented today for surgery, with the diagnosis of right proximal stone  The various methods of treatment have been discussed with the patient and family. After consideration of risks, benefits and other options for treatment, the patient has consented to  Procedure(s): RIGHT EXTRACORPOREAL SHOCK WAVE LITHOTRIPSY (ESWL) (Right) as a surgical intervention .  The patient's history has been reviewed, patient examined, no change in status, stable for surgery.  I have reviewed the patient's chart and labs.  Questions were answered to the patient's satisfaction.     Cody Sullivan J

## 2016-01-26 NOTE — Discharge Instructions (Signed)
Lithotripsy, Care After °Refer to this sheet in the next few weeks. These instructions provide you with information on caring for yourself after your procedure. Your health care provider may also give you more specific instructions. Your treatment has been planned according to current medical practices, but problems sometimes occur. Call your health care provider if you have any problems or questions after your procedure. °WHAT TO EXPECT AFTER THE PROCEDURE  °· Your urine may have a red tinge for a few days after treatment. Blood loss is usually minimal. °· You may have soreness in the back or flank area. This usually goes away after a few days. The procedure can cause blotches or bruises on the back where the pressure wave enters the skin. These marks usually cause only minimal discomfort and should disappear in a short time. °· Stone fragments should begin to pass within 24 hours of treatment. However, a delayed passage is not unusual. °· You may have pain, discomfort, and feel sick to your stomach (nauseated) when the crushed fragments of stone are passed down the tube from the kidney to the bladder. Stone fragments can pass soon after the procedure and may last for up to 4-8 weeks. °· A small number of patients may have severe pain when stone fragments are not able to pass, which leads to an obstruction. °· If your stone is greater than 1 inch (2.5 cm) in diameter or if you have multiple stones that have a combined diameter greater than 1 inch (2.5 cm), you may require more than one treatment. °· If you had a stent placed prior to your procedure, you may experience some discomfort, especially during urination. You may experience the pain or discomfort in your flank or back, or you may experience a sharp pain or discomfort at the base of your penis or in your lower abdomen. The discomfort usually lasts only a few minutes after urinating. °HOME CARE INSTRUCTIONS  °· Rest at home until you feel your energy  improving. °· Only take over-the-counter or prescription medicines for pain, discomfort, or fever as directed by your health care provider. Depending on the type of lithotripsy, you may need to take antibiotics and anti-inflammatory medicines for a few days. °· Drink enough water and fluids to keep your urine clear or pale yellow. This helps "flush" your kidneys. It helps pass any remaining pieces of stone and prevents stones from coming back. °· Most people can resume daily activities within 1-2 days after standard lithotripsy. It can take longer to recover from laser and percutaneous lithotripsy. °· Strain all urine through the provided strainer. Keep all particulate matter and stones for your health care provider to see. The stone may be as small as a grain of salt. It is very important to use the strainer each and every time you pass your urine. Any stones that are found can be sent to a medical lab for examination. °· Visit your health care provider for a follow-up appointment in a few weeks. Your doctor may remove your stent if you have one. Your health care provider will also check to see whether stone particles still remain. °SEEK MEDICAL CARE IF:  °· Your pain is not relieved by medicine. °· You have a lasting nauseous feeling. °· You feel there is too much blood in the urine. °· You develop persistent problems with frequent or painful urination that does not at least partially improve after 2 days following the procedure. °· You have a congested cough. °· You feel   lightheaded. °· You develop a rash or any other signs that might suggest an allergic problem. °· You develop any reaction or side effects to your medicine(s). °SEEK IMMEDIATE MEDICAL CARE IF:  °· You experience severe back or flank pain or both. °· You see nothing but blood when you urinate. °· You cannot pass any urine at all. °· You have a fever or shaking chills. °· You develop shortness of breath, difficulty breathing, or chest pain. °· You  develop vomiting that will not stop after 6-8 hours. °· You have a fainting episode. °  °This information is not intended to replace advice given to you by your health care provider. Make sure you discuss any questions you have with your health care provider. °  °Document Released: 06/24/2007 Document Revised: 02/23/2015 Document Reviewed: 12/18/2012 °Elsevier Interactive Patient Education ©2016 [employer] ° °

## 2016-06-10 ENCOUNTER — Other Ambulatory Visit: Payer: Self-pay | Admitting: Family Medicine

## 2016-06-10 DIAGNOSIS — R030 Elevated blood-pressure reading, without diagnosis of hypertension: Secondary | ICD-10-CM

## 2016-08-11 ENCOUNTER — Other Ambulatory Visit: Payer: Self-pay | Admitting: Urgent Care

## 2016-08-11 DIAGNOSIS — R030 Elevated blood-pressure reading, without diagnosis of hypertension: Secondary | ICD-10-CM

## 2016-09-08 ENCOUNTER — Encounter (HOSPITAL_BASED_OUTPATIENT_CLINIC_OR_DEPARTMENT_OTHER): Payer: Self-pay | Admitting: Emergency Medicine

## 2016-09-08 ENCOUNTER — Emergency Department (HOSPITAL_BASED_OUTPATIENT_CLINIC_OR_DEPARTMENT_OTHER): Payer: BLUE CROSS/BLUE SHIELD

## 2016-09-08 ENCOUNTER — Emergency Department (HOSPITAL_BASED_OUTPATIENT_CLINIC_OR_DEPARTMENT_OTHER)
Admission: EM | Admit: 2016-09-08 | Discharge: 2016-09-08 | Disposition: A | Discharge status: Home / Self Care | Payer: BLUE CROSS/BLUE SHIELD | Attending: Emergency Medicine | Admitting: Emergency Medicine

## 2016-09-08 DIAGNOSIS — R05 Cough: Secondary | ICD-10-CM | POA: Insufficient documentation

## 2016-09-08 DIAGNOSIS — Y9389 Activity, other specified: Secondary | ICD-10-CM | POA: Diagnosis not present

## 2016-09-08 DIAGNOSIS — R072 Precordial pain: Secondary | ICD-10-CM | POA: Diagnosis not present

## 2016-09-08 DIAGNOSIS — Y999 Unspecified external cause status: Secondary | ICD-10-CM | POA: Diagnosis not present

## 2016-09-08 DIAGNOSIS — S299XXA Unspecified injury of thorax, initial encounter: Secondary | ICD-10-CM | POA: Diagnosis present

## 2016-09-08 DIAGNOSIS — W2209XA Striking against other stationary object, initial encounter: Secondary | ICD-10-CM | POA: Insufficient documentation

## 2016-09-08 DIAGNOSIS — R0781 Pleurodynia: Secondary | ICD-10-CM

## 2016-09-08 DIAGNOSIS — I10 Essential (primary) hypertension: Secondary | ICD-10-CM | POA: Insufficient documentation

## 2016-09-08 DIAGNOSIS — Z79899 Other long term (current) drug therapy: Secondary | ICD-10-CM | POA: Diagnosis not present

## 2016-09-08 DIAGNOSIS — Y929 Unspecified place or not applicable: Secondary | ICD-10-CM | POA: Insufficient documentation

## 2016-09-08 MED ORDER — OXYCODONE-ACETAMINOPHEN 5-325 MG PO TABS: 1.0000 | ORAL_TABLET | ORAL | 0 refills | Status: DC | PRN

## 2016-09-08 MED ORDER — IBUPROFEN 400 MG PO TABS
600.0000 mg | ORAL_TABLET | Freq: Once | ORAL | Status: AC
  Administered 2016-09-08: 600 mg via ORAL
  Filled 2016-09-08: qty 1

## 2016-09-08 NOTE — Discharge Instructions (Signed)
Your x-ray showed fracture however there could be a small fracture that did not show up. Please use the pain medicine to stay on top of your pain. May take Motrin/ibuprofen/Aleve with this medication. Avoid excess Tylenol. Apply ice to your rib cage. Use the incentive spirometer as discussed. Return to the ED if he develops any worsening symptoms including difficulty breathing, productive cough, fever, abdominal pain, nausea, vomiting.
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
## 2017-03-27 ENCOUNTER — Encounter: Payer: Self-pay | Admitting: Cardiology

## 2017-03-28 ENCOUNTER — Ambulatory Visit
Admission: RE | Admit: 2017-03-28 | Discharge: 2017-03-28 | Disposition: A | Discharge status: Home / Self Care | Payer: BLUE CROSS/BLUE SHIELD | Source: Ambulatory Visit | Attending: Cardiology | Admitting: Cardiology

## 2017-03-28 ENCOUNTER — Other Ambulatory Visit: Payer: Self-pay | Admitting: Cardiology

## 2017-03-28 DIAGNOSIS — R079 Chest pain, unspecified: Secondary | ICD-10-CM

## 2017-06-07 IMAGING — CR DG ABDOMEN 1V
2 series · 2 of 2 positions shown · non-contrast
Comparison: 01/20/2016

CLINICAL DATA: Right-sided kidney stone

EXAM:
ABDOMEN - 1 VIEW

[t abdomen supine (1 of 2)]
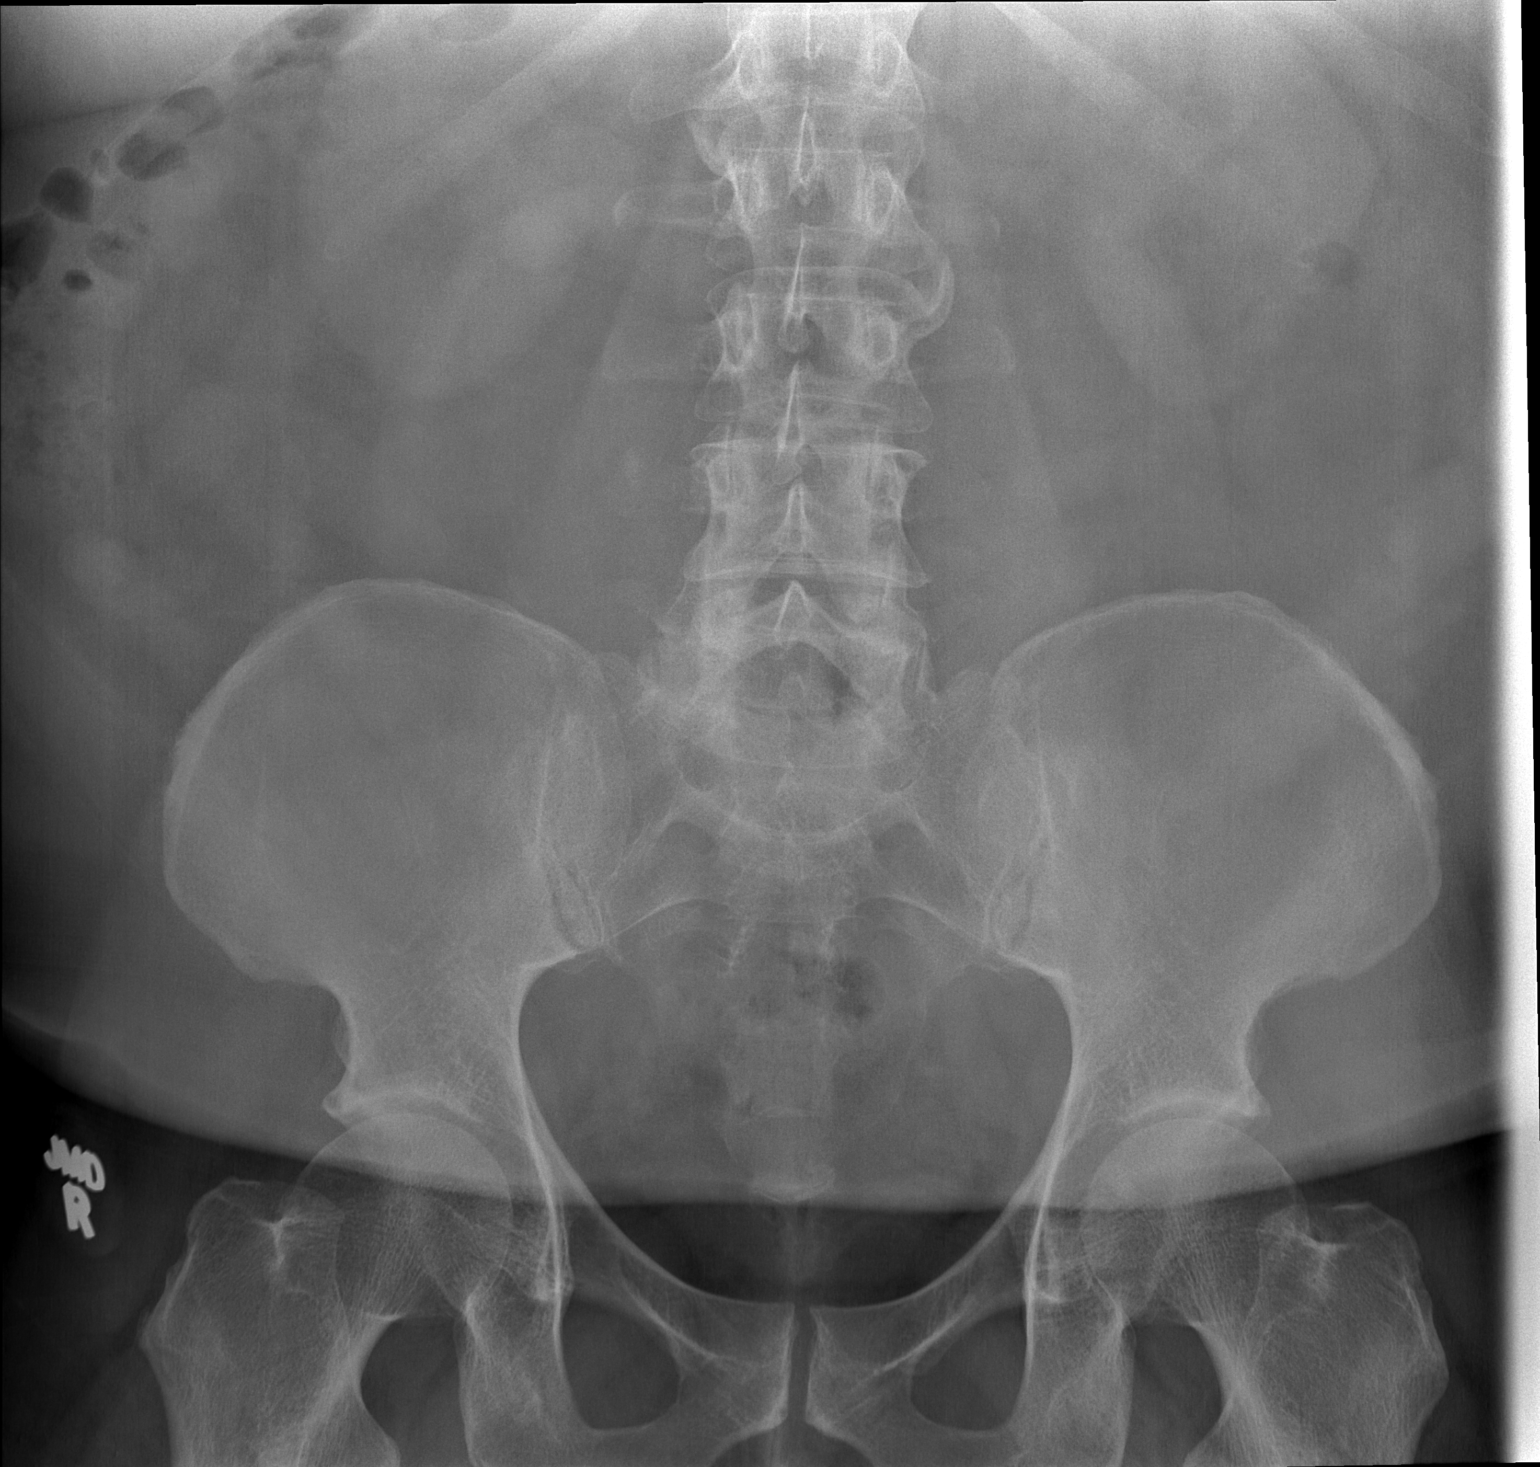

[t abdomen supine (2 of 2)]
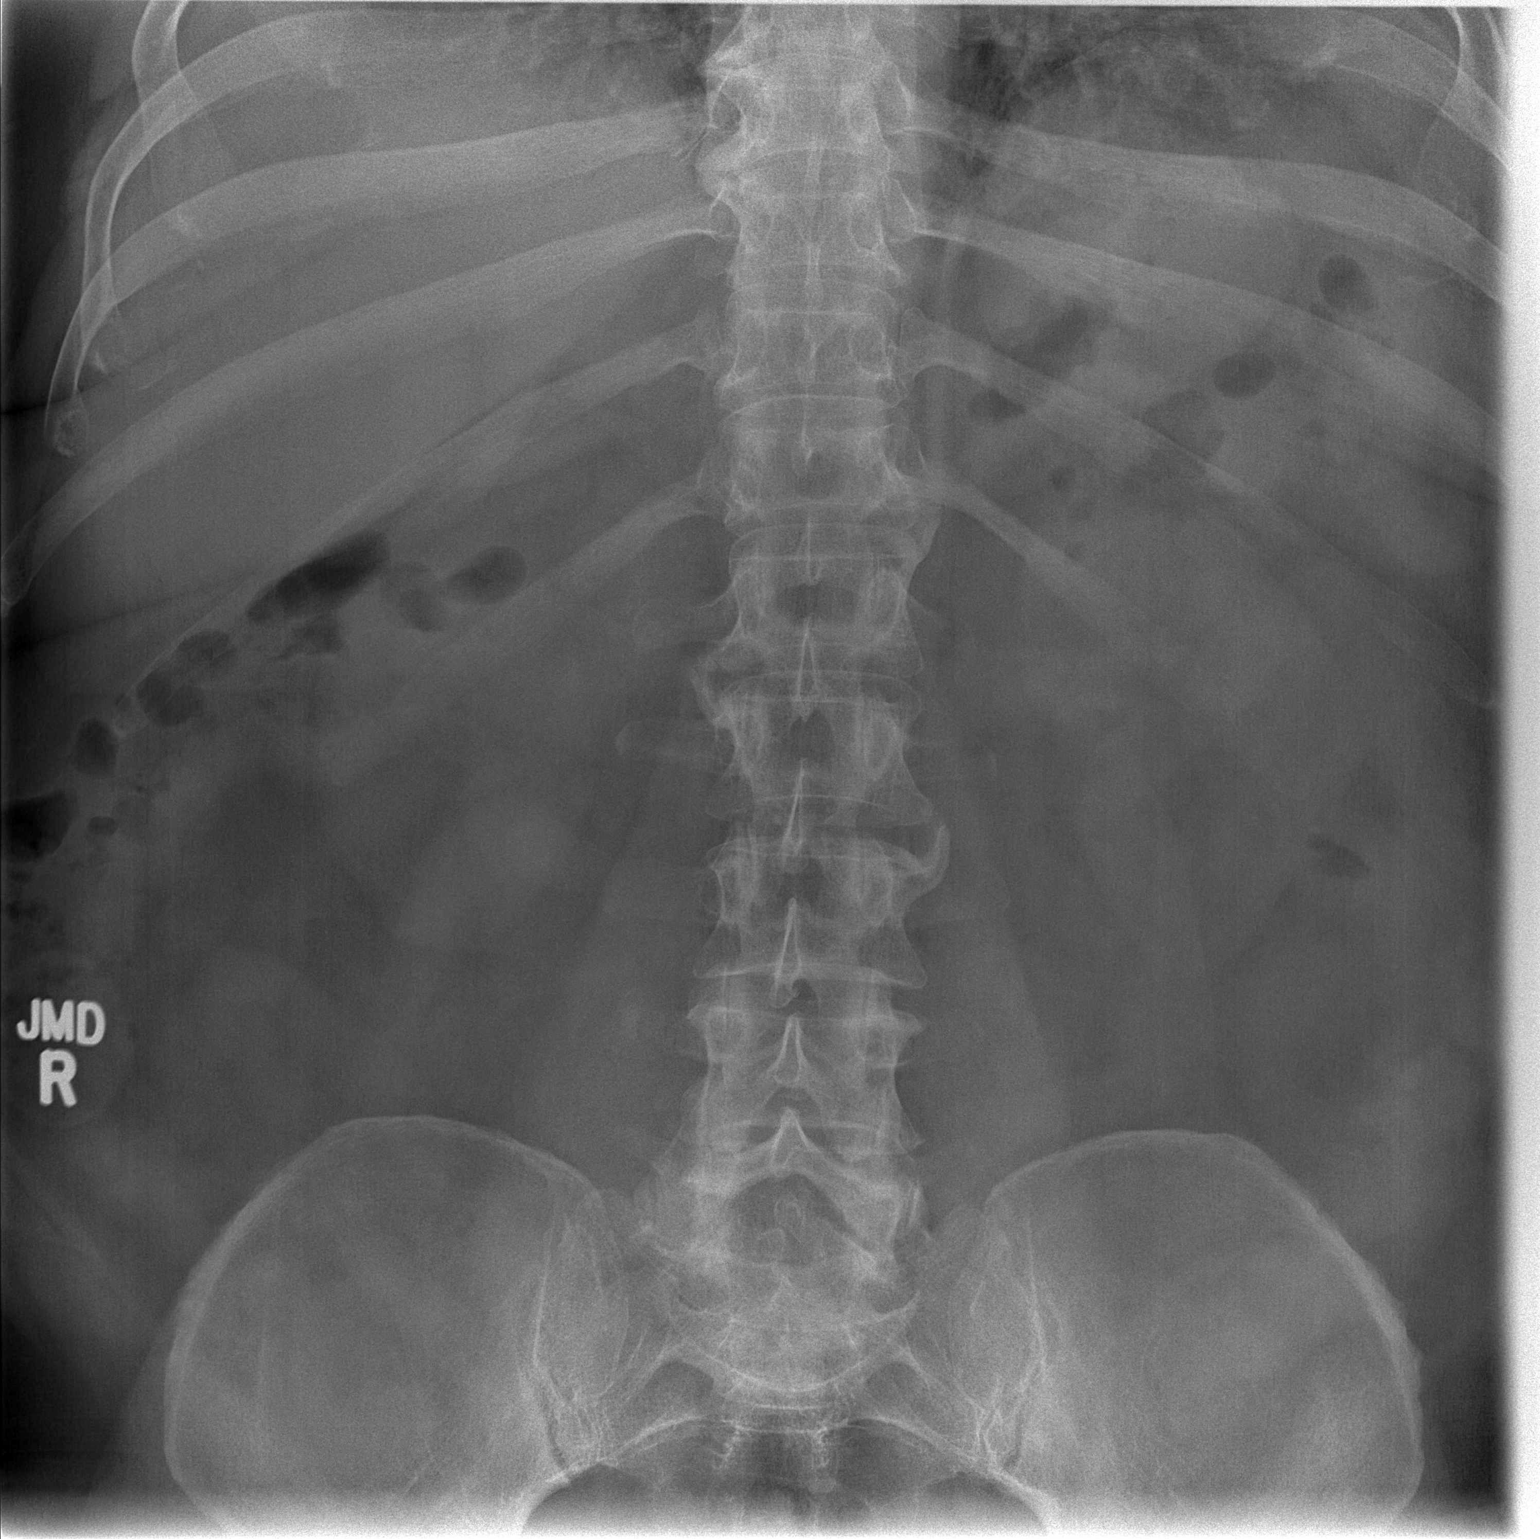

[2 of 2 positions shown; findings below may reference images not displayed]

FINDINGS: Scattered large and small bowel gas is noted. The known right
ureteral stone is again identified overlying the right transverse
process of L4. No other calculi are noted. No acute bony abnormality
is seen.
IMPRESSION: Stable right ureteral stone.

## 2017-07-25 ENCOUNTER — Other Ambulatory Visit: Payer: Self-pay

## 2017-07-25 ENCOUNTER — Encounter (HOSPITAL_COMMUNITY): Payer: Self-pay | Admitting: *Deleted

## 2017-07-25 ENCOUNTER — Emergency Department (HOSPITAL_COMMUNITY)
Admission: EM | Admit: 2017-07-25 | Discharge: 2017-07-25 | Disposition: A | Discharge status: Home / Self Care | Payer: BLUE CROSS/BLUE SHIELD | Attending: Emergency Medicine | Admitting: Emergency Medicine

## 2017-07-25 ENCOUNTER — Emergency Department (HOSPITAL_COMMUNITY): Payer: BLUE CROSS/BLUE SHIELD

## 2017-07-25 DIAGNOSIS — Z79899 Other long term (current) drug therapy: Secondary | ICD-10-CM | POA: Diagnosis not present

## 2017-07-25 DIAGNOSIS — I10 Essential (primary) hypertension: Secondary | ICD-10-CM | POA: Diagnosis not present

## 2017-07-25 DIAGNOSIS — R0789 Other chest pain: Secondary | ICD-10-CM | POA: Insufficient documentation

## 2017-07-25 LAB — BASIC METABOLIC PANEL
ANION GAP: 12 (ref 5–15)
BUN: 9 mg/dL (ref 6–20)
CO2: 23 mmol/L (ref 22–32)
Calcium: 9.4 mg/dL (ref 8.9–10.3)
Chloride: 102 mmol/L (ref 101–111)
Creatinine, Ser: 0.89 mg/dL (ref 0.61–1.24)
GFR calc Af Amer: >60 mL/min (ref >60)
Glucose, Bld: 85 mg/dL (ref 65–99)
POTASSIUM: 3.7 mmol/L (ref 3.5–5.1)
SODIUM: 137 mmol/L (ref 135–145)

## 2017-07-25 LAB — CBC
HEMATOCRIT: 44 % (ref 39.0–52.0)
HEMOGLOBIN: 14.7 g/dL (ref 13.0–17.0)
MCH: 29.8 pg (ref 26.0–34.0)
MCHC: 33.4 g/dL (ref 30.0–36.0)
MCV: 89.2 fL (ref 78.0–100.0)
Platelets: 319 10*3/uL (ref 150–400)
RBC: 4.93 MIL/uL (ref 4.22–5.81)
RDW: 13.8 % (ref 11.5–15.5)
WBC: 11.1 10*3/uL — AB (ref 4.0–10.5)

## 2017-07-25 LAB — I-STAT TROPONIN, ED
TROPONIN I, POC: 0 ng/mL (ref 0.00–0.08)
Troponin i, poc: 0 ng/mL (ref 0.00–0.08)

## 2017-07-25 LAB — D-DIMER, QUANTITATIVE (NOT AT ARMC): D DIMER QUANT: 0.36 ug{FEU}/mL (ref 0.00–0.50)

## 2017-07-25 MED ORDER — KETOROLAC TROMETHAMINE 15 MG/ML IJ SOLN
15.0000 mg | Freq: Once | INTRAMUSCULAR | Status: AC
Start: 2017-07-25 — End: 2017-07-25
  Administered 2017-07-25: 15 mg via INTRAVENOUS
  Filled 2017-07-25: qty 1

## 2017-07-25 MED ORDER — KETOROLAC TROMETHAMINE 60 MG/2ML IM SOLN: 30.0000 mg | Freq: Once | INTRAMUSCULAR | Status: DC
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
## 2017-07-25 NOTE — ED Provider Notes (Signed)
[facility] EMERGENCY DEPARTMENT Provider Note  CSN: [id number] Arrival date & time: 07/25/17 1440  Chief Complaint(s) Chest Pain  HPI Cody Sullivan is a 42 y.o. male   The history is provided by the patient.  Chest Pain   This is a new problem. Episode onset: 2 days. The problem occurs constantly. The problem has not changed since onset.The pain is present in the substernal region. The pain is moderate. The quality of the pain is described as pressure-like. The pain radiates to the upper back. The symptoms are aggravated by certain positions and deep breathing. Associated symptoms include lower extremity edema and shortness of breath (due to pain). Pertinent negatives include no cough, no diaphoresis, no fever, no leg pain, no malaise/fatigue and no nausea. Risk factors include obesity.  His past medical history is significant for hypertension.  Pertinent negatives for past medical history include no CAD, no CHF, no diabetes, no DVT, no hyperlipidemia, no MI, no PE, no strokes and no TIA.   Travels 3-4 hrs one way to [facility] daily.   Past Medical History Past Medical History:  Diagnosis Date  . Allergy   . Hypertension   . Kidney stones   . Sleep apnea    cpap   There are no active problems to display for this patient.  Home Medication(s) Prior to Admission medications   Medication Sig Start Date End Date Taking? Authorizing Provider  lisinopril-hydrochlorothiazide (PRINZIDE,ZESTORETIC) 10-12.5 MG tablet TAKE 1 TABLET BY MOUTH DAILY. 06/13/16  Yes Jaynee Eagles, PA-C  Nutritional Supplements (EAA SUPPLEMENT) PACK Take by mouth. ADvocare   Yes [provider]  oxyCODONE-acetaminophen (PERCOCET) 5-325 MG tablet Take 1-2 tablets by mouth every 4 (four) hours as needed for severe pain. Patient not taking: Reported on 07/25/2017 09/08/16   Doristine Devoid, PA-C  promethazine (PHENERGAN) 25 MG tablet Take 1 tablet (25 mg total) by mouth every 6 (six) hours as needed  for nausea or vomiting. Patient not taking: Reported on 07/25/2017 01/20/16   Little, Wenda Overland, MD  tamsulosin (FLOMAX) 0.4 MG CAPS capsule Take 1 capsule (0.4 mg total) by mouth daily. Patient not taking: Reported on 07/25/2017 01/20/16   Little, Wenda Overland, MD                                                                                                                                    Past Surgical History Past Surgical History:  Procedure Laterality Date  . ANKLE SURGERY  2003   right   . cracked chest  2007   equipment fell on chest and cracked his chest, had pain related to injury, cardiologist confirmed not related to heart.    Marland Kitchen LITHOTRIPSY  2010  . nose fracture  2000   Family History No family history on file.  Social History Social History   Tobacco Use  . Smoking status: Never Smoker  . Smokeless tobacco: Never Used  Substance Use Topics  . Alcohol use: No    Alcohol/week: 0.0 oz  . Drug use: No   Allergies Patient has no known allergies.  Review of Systems Review of Systems  Constitutional: Negative for diaphoresis, fever and malaise/fatigue.  Respiratory: Positive for shortness of breath (due to pain). Negative for cough.   Cardiovascular: Positive for chest pain.  Gastrointestinal: Negative for nausea.   All other systems are reviewed and are negative for acute change except as noted in the HPI  Physical Exam Vital Signs  I have reviewed the triage vital signs BP 130/79   Pulse 62   Temp 98.9 F (37.2 C) (Oral)   Resp 16   Ht 5\' 10"  (1.778 m)   Wt 127 kg (280 lb)   SpO2 97%   BMI 40.18 kg/m   Physical Exam  Constitutional: He is oriented to person, place, and time. He appears well-developed and well-nourished. No distress.  HENT:  Head: Normocephalic and atraumatic.  Nose: Nose normal.  Eyes: Conjunctivae and EOM are normal. Pupils are equal, round, and reactive to light. Right eye exhibits no discharge. Left eye exhibits no discharge. No  scleral icterus.  Neck: Normal range of motion. Neck supple.  Cardiovascular: Normal rate and regular rhythm. Exam reveals no gallop and no friction rub.  No murmur heard. Pulmonary/Chest: Effort normal and breath sounds normal. No stridor. No respiratory distress. He has no rales.     He exhibits tenderness.    Abdominal: Soft. He exhibits no distension. There is no tenderness.  Musculoskeletal: He exhibits no edema or tenderness.  1 + BLE edema   Neurological: He is alert and oriented to person, place, and time.  Skin: Skin is warm and dry. No rash noted. He is not diaphoretic. No erythema.  Psychiatric: He has a normal mood and affect.  Vitals reviewed.   ED Results and Treatments Labs (all labs ordered are listed, but only abnormal results are displayed) Labs Reviewed  CBC - Abnormal; Notable for the following components:      Result Value   WBC 11.1 (*)    All other components within normal limits  BASIC METABOLIC PANEL  D-DIMER, QUANTITATIVE (NOT AT [facility])  I-STAT TROPONIN, ED  I-STAT TROPONIN, ED                                                                                                                         EKG  EKG Interpretation  Date/Time:  Thursday July 25 2017 14:43:41 EST Ventricular Rate:  70 PR Interval:  146 QRS Duration: 96 QT Interval:  404 QTC Calculation: 436 R Axis:   2 Text Interpretation:  Normal sinus rhythm Incomplete right bundle branch block Borderline ECG NO STEMI No old tracing to compare Confirmed by Addison Lank [phone]) on 07/25/2017 8:11:45 PM      Radiology Dg Chest 2 View  Result Date: 07/25/2017 CLINICAL DATA:  Chest pain EXAM: CHEST  2 VIEW COMPARISON:  Radiograph earlier today  FINDINGS: Likely normal heart size when accounting for apical fat pad. Mild scarring at the lingula. Chronic convexity of the posterior left pleural attributed to fat based on 01/20/2016 abdominal CT. There is no edema, consolidation, effusion, or  pneumothorax. IMPRESSION: No evidence of active disease. Electronically Signed   By: Monte Fantasia M.D.   On: 07/25/2017 15:42   Pertinent labs & imaging results that were available during my care of the patient were reviewed by me and considered in my medical decision making (see chart for details).  Medications Ordered in ED Medications  ketorolac (TORADOL) 15 MG/ML injection 15 mg (15 mg Intravenous Given 07/25/17 2131)                                                                                                                                    Procedures Procedures  (including critical care time)  Medical Decision Making / ED Course I have reviewed the nursing notes for this encounter and the patient's prior records (if available in EHR or on provided paperwork).    2 days of parasternal pain radiating to the back.  Most likely MSK pain.  Highly atypical for ACS.  EKG without acute ischemic changes or evidence of pericarditis.  Initial troponin negative.  Heart score less than 4.  Delta troponin negative.  Feel this is sufficient to rule out ACS.  Low pretest probability for pulmonary embolism.  D-dimer negative.  Low suspicion for PE.  Presentation not classic for aortic dissection or esophageal perforation.  Chest x-ray without evidence suggestive of pneumonia, pneumothorax, pneumomediastinum.  No abnormal contour of the mediastinum to suggest dissection. No evidence of acute injuries.  The patient appears reasonably screened and/or stabilized for discharge and I doubt any other medical condition or other [facility] requiring further screening, evaluation, or treatment in the ED at this time prior to discharge.  The patient is safe for discharge with strict return precautions.   Final Clinical Impression(s) / ED Diagnoses Final diagnoses:  Chest wall pain    Disposition: Discharge  Condition: Good  I have discussed the results, Dx and Tx plan with the patient who expressed  understanding and agree(s) with the plan. Discharge instructions discussed at great length. The patient was given strict return precautions who verbalized understanding of the instructions. No further questions at time of discharge.    ED Discharge Orders    None       Follow Up: ViaLennette Bihari, MD [place] [address] [phone]        This chart was dictated using voice recognition software.  Despite best efforts to proofread,  errors can occur which can change the documentation meaning.   Fatima Blank, MD 07/25/17 2256

## 2017-07-25 NOTE — Discharge Instructions (Signed)
You may use over-the-counter Motrin (Ibuprofen), Acetaminophen (Tylenol), topical muscle creams such as SalonPas, Icy Hot, Bengay, etc. Please stretch, apply heat, and have massage therapy for additional assistance. ° °

## 2017-10-30 ENCOUNTER — Encounter: Payer: Self-pay | Admitting: Internal Medicine

## 2017-11-09 ENCOUNTER — Other Ambulatory Visit: Payer: Self-pay | Admitting: Student

## 2018-04-08 ENCOUNTER — Other Ambulatory Visit: Payer: Self-pay | Admitting: Family Medicine

## 2018-04-08 DIAGNOSIS — R1900 Intra-abdominal and pelvic swelling, mass and lump, unspecified site: Secondary | ICD-10-CM

## 2018-04-09 NOTE — Progress Notes (Addendum)
Name: Cody Sullivan  MRN/ DOB: [id number], 13-Jun-1976   Age/ Sex: 42 y.o., male    PCP: ViaLennette Bihari, MD   Reason for Endocrinology Evaluation: Type 2 Diabetes Mellitus     Date of Initial Endocrinology Visit: 04/10/2018     PATIENT IDENTIFIER: Cody Sullivan is a 42 y.o. male with a past medical history of OSA on CPAP for the past 2, dyslipidemia and obesity. The patient presented for initial ndocrinology clinic visit on 04/10/2018 for consultative assistance with his diabetes management.    HPI: Cody Sullivan   Diagnosed with DM 2019 Prior Medications tried: N/A Hypoglycemia episodes : 0              Hemoglobin A1c has ranged from 6.3%  in 10/2017, peaking at 6.5% in 03/2018. 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 Paternal Grandparents with DM       In terms of low Testosterone:  He has been tired for 2 years, he has noted somewhat decrease in libido, no difficulty with erections. He has noted gynecomastia for a while, no pain or discharge from the nipples. He also noted decrease in testicular size ~ 2 months ago. He has 2 biological kids. He has not noticed any decrease in frequency of shaving.  Denies new onset headaches. Denies vision changes.    No known FH  of prostate cancer.    HOME DIABETES REGIMEN: Metformin - Not taking    Statin: No ACE-I/ARB:Yes  Prior Diabetic Education: No   METER DOWNLOAD SUMMARY:  Does not check glucose   DIABETIC COMPLICATIONS: Microvascular complications:    Denies: retinopathy, neuropathy or nephropathy  Last eye exam: Completed > 1 yr ago  Macrovascular complications:    Denies: CAD, PVD, CVA   PAST HISTORY: Past Medical History:  Past Medical History:    Diagnosis Date  . Allergy   . Hypertension   . Kidney stones   . Sleep apnea    cpap   Past Surgical History:  Past Surgical History:  Procedure Laterality Date  . ANKLE SURGERY  2003   right   . cracked chest  2007   equipment fell on chest and cracked his chest, had pain related to injury, cardiologist confirmed not related to heart.    Marland Kitchen LITHOTRIPSY  2010  . nose fracture  2000      Social History:  reports that he has never smoked. He has never used smokeless tobacco. He reports that he does not drink alcohol or use drugs.  Family History: Grandparents with DM   HOME MEDICATIONS: Allergies as of 04/10/2018   No Known Allergies     Medication List        Accurate as of 04/10/18  7:40 AM. Always use your most recent med list.          lisinopril-hydrochlorothiazide 20-25 MG tablet Commonly known as:  PRINZIDE,ZESTORETIC Take 1 tablet by mouth daily.   minocycline 50 MG capsule Commonly known as:  MINOCIN,DYNACIN Take 50 mg by mouth 2 (two) times daily.        ALLERGIES: No Known Allergies   REVIEW OF SYSTEMS: A comprehensive ROS was conducted with the patient and is negative  except as per HPI and below:  Review of Systems  Constitutional: Negative.   HENT: Negative.   Eyes: Negative.   Respiratory: Negative.   Cardiovascular: Negative.   Gastrointestinal: Negative.   Genitourinary: Negative for frequency and urgency.  Musculoskeletal: Negative.   Skin: Negative.   Neurological: Negative for tingling and tremors.  Endo/Heme/Allergies: Negative.       OBJECTIVE:   VITAL SIGNS: BP 122/80 (BP Location: Left Arm, Patient Position: Sitting)   Pulse 70   Ht '5\' 10"'$  (1.778 m)   Wt 129.5 kg   SpO2 97%   BMI 40.95 kg/m    PHYSICAL EXAM:  General: Pt appears well and is in NAD  Hydration: Well-hydrated with moist mucous membranes and good skin turgor  HEENT: Head: Unremarkable with good dentition. Oropharynx clear without exudate.  Eyes:  External eye exam normal without stare, lid lag or exophthalmos.  EOM intact.  PERRL.  Neck: General: Supple without adenopathy or carotid bruits. Thyroid: Thyroid size normal.  No goiter or nodules appreciated. No thyroid bruit.  Lungs: Clear with good BS bilat with no rales, rhonchi, or wheezes  Heart: RRR with normal S1 and S2 and no gallops; no murmurs; no rub  Abdomen: Normoactive bowel sounds, soft, nontender, without masses or organomegaly palpable Right testis ~ 15 mL, left testis ~ 25 mL  Prostate : no nodules noted   Extremities:  Lower extremities - No pretibial edema. No lesions.  Skin: Normal texture and temperature to palpation. No rash noted. No Acanthosis nigricans/skin tags. No lipohypertrophy.  Neuro: MS is good with appropriate affect, pt is alert and Ox3    DM foot exam: 04/10/18 The skin of the feet is intact without sores or ulcerations. Patient does have a callous formation at the left 1st MTJ The pedal pulses are 2+ on right and 2+ on left. The sensation is absent to a screening 5.07, 10 gram monofilament bilaterally at the toes.    DATA REVIEWED:  A1c 6.5 % (04/03/2018)  TSH 2.59 uIU/mL Testosterone 106.4 ng/dL ([phone] )   No visits with results within 1 Week(s) from this visit.  Latest known visit with results is:  Admission on 07/25/2017, Discharged on 07/25/2017  Component Date Value Ref Range Status  . Sodium 07/25/2017 137  135 - 145 mmol/L Final  . Potassium 07/25/2017 3.7  3.5 - 5.1 mmol/L Final  . Chloride 07/25/2017 102  101 - 111 mmol/L Final  . CO2 07/25/2017 23  22 - 32 mmol/L Final  . Glucose, Bld 07/25/2017 85  65 - 99 mg/dL Final  . BUN 07/25/2017 9  6 - 20 mg/dL Final  . Creatinine, Ser 07/25/2017 0.89  0.61 - 1.24 mg/dL Final  . Calcium 07/25/2017 9.4  8.9 - 10.3 mg/dL Final  . GFR calc non Af Amer 07/25/2017 >60  >60 mL/min Final  . GFR calc Af Amer 07/25/2017 >60  >60 mL/min Final   Comment: (NOTE) The eGFR has been calculated  using the CKD EPI equation. This calculation has not been validated in all clinical situations. eGFR's persistently <60 mL/min signify possible Chronic Kidney Disease.     ASSESSMENT / PLAN / RECOMMENDATIONS:   1) Type 2 Diabetes Mellitus, Newly Diagnosed with neuropathy - Most recent A1c of 6.5 %. Goal A1c < 7.0 %.    GENERAL:  Patient would like to avoid taking medicine at this time and would like to try lifestyle changes.   I explained to him that he will need strong commitment  because at this point his glucose is easy to control but if hyperglycemia continues to worsen, it may be more difficult to treat then. He is motivated I have discussed with the patient the pathophysiology of diabetes. We went over the natural progression of the disease. We talked about both insulin resistance and insulin deficiency. We stressed the importance of lifestyle changes including diet and exercise. I explained the complications associated with diabetes including retinopathy, nephropathy, neuropathy as well as increased risk of cardiovascular disease. We went over the benefit seen with glycemic control.   - I explained to the patient that patients with diabetes are at higher than normal risk for amputations. The patient was informed that diabetes is the number one cause of non-traumatic amputations in [place].   - Will refer him for diabetes education as this has not been done yet    MEDICATIONS:  N/A would like to try with no meds.   EDUCATION / INSTRUCTIONS:  BG monitoring instructions: Patient is instructed to check his blood sugars 2 times a day, fasting and bedtime.  Call [facility] if: BG persistently < 70 or > 300. . I reviewed the Rule of 15 for the treatment of hypoglycemia in detail with the patient. Literature supplied.   2) Diabetic complications:   Eye: Does not have known diabetic retinopathy. Last eye exam was   Neuro/ Feet: Does have known diabetic  peripheral neuropathy. Based on exam   Renal: Patient does have known baseline CKD. He is on an ACEI/ARB at present.   3) Lipids: Per ADA guidelines, moderate intensity statin may be considered if LDL >100 mg/dL for adults aged > 40 with DM. Will defer to PCP    4) Hypertension: He is at goal of < 140/90 mmHg.    5) Low Testosterone:   - Despite his low Total testosterone levels the patient does not have a lot of symptoms of hypogonadism.  - We will repeat Testosterone today including LH, FSH  - We discussed the importance of OSA treatment prior to initiating testosterone therapy (should he need it). We discussed side effects of testosterone therapy such as worsening of sleep apnea that is severe and untreated,  Prostate volumes and serum PSA increase in response to testosterone treatment which might increase BPH and worsen urinary outflow obstruction as well as prostate cancer risk.   F/u in 3 months.    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 Will refer to neurosurgery,     Signed electronically by: Mack Guise, MD  [facility] Endocrinology  [facility] [address] [place], [place] [address] Phone: [phone] FAX: [phone]   CC: [address], [place] [address] Phone: [phone]  Fax: [phone]    Return to Endocrinology clinic as below: Future Appointments  Date Time Provider [place]  04/10/2018  2:00 PM [facility] [place] [facility]

## 2018-04-10 ENCOUNTER — Ambulatory Visit: Payer: BLUE CROSS/BLUE SHIELD | Admitting: Internal Medicine

## 2018-04-10 ENCOUNTER — Ambulatory Visit
Admission: RE | Admit: 2018-04-10 | Discharge: 2018-04-10 | Disposition: A | Discharge status: Home / Self Care | Payer: BLUE CROSS/BLUE SHIELD | Source: Ambulatory Visit | Attending: Family Medicine | Admitting: Family Medicine

## 2018-04-10 ENCOUNTER — Other Ambulatory Visit: Payer: Self-pay | Admitting: Family Medicine

## 2018-04-10 ENCOUNTER — Encounter: Payer: Self-pay | Admitting: Internal Medicine

## 2018-04-10 VITALS — BP 122/80 | HR 70 | Ht 70.0 in | Wt 285.4 lb

## 2018-04-10 DIAGNOSIS — E119 Type 2 diabetes mellitus without complications: Secondary | ICD-10-CM

## 2018-04-10 DIAGNOSIS — R7989 Other specified abnormal findings of blood chemistry: Secondary | ICD-10-CM

## 2018-04-10 DIAGNOSIS — R1900 Intra-abdominal and pelvic swelling, mass and lump, unspecified site: Secondary | ICD-10-CM

## 2018-04-10 LAB — PSA: PSA: 0.64 ng/mL (ref 0.10–4.00)

## 2018-04-10 LAB — LUTEINIZING HORMONE: LH: 5.02 m[IU]/mL (ref 1.50–9.30)

## 2018-04-10 LAB — FOLLICLE STIMULATING HORMONE: FSH: 8.7 m[IU]/mL (ref 1.4–18.1)

## 2018-04-10 MED ORDER — MICROLET LANCETS MISC: 2 refills | Status: AC

## 2018-04-10 MED ORDER — GLUCOSE BLOOD VI STRP: ORAL_STRIP | 12 refills | Status: DC

## 2018-04-10 MED ORDER — CONTOUR NEXT EZ W/DEVICE KIT: 1.0000 | PACK | Freq: Two times a day (BID) | 0 refills | Status: DC

## 2018-04-10 NOTE — Patient Instructions (Addendum)
-   Check sugars once a day, fasting (before eating breakfast) - Exercise on average 150-170 minutes per week  - Fasting goal is 80- 130 mg/dL - You need an eye exam for diabetes diagnosis  - Avoid all sugar-sweetened beverages

## 2018-04-10 NOTE — Addendum Note (Signed)
Addended by: Benson Setting L on: 04/10/2018 04:46 PM   Modules accepted: Orders

## 2018-04-11 LAB — PROLACTIN: PROLACTIN: 8.8 ng/mL (ref 2.0–18.0)

## 2018-04-12 LAB — TESTOSTERONE, FREE, TOTAL, SHBG
SEX HORMONE BINDING: 18.4 nmol/L (ref 16.5–55.9)
TESTOSTERONE FREE: 3.8 pg/mL — AB (ref 6.8–21.5)
Testosterone: 126 ng/dL — ABNORMAL LOW (ref 264–916)

## 2018-04-12 LAB — SPECIMEN STATUS REPORT

## 2018-04-15 ENCOUNTER — Telehealth: Payer: Self-pay | Admitting: Internal Medicine

## 2018-04-15 NOTE — Telephone Encounter (Signed)
Fax over patients lab results to primary office. Fax # [phone]

## 2018-04-16 ENCOUNTER — Telehealth: Payer: Self-pay | Admitting: Internal Medicine

## 2018-04-16 DIAGNOSIS — E230 Hypopituitarism: Secondary | ICD-10-CM

## 2018-04-16 NOTE — Telephone Encounter (Signed)
Spoke to Cody Sullivan on October 30 at 10 AM. Discussed his low total testosterone levels, low free T4 levels, and his abnormal LH and FSH in the setting of low testosterone. We discussed the need for pituitary MRI, based on the results we will decide if we need to do further hormonal work-up versus starting testosterone replacement therapy. Patient expressed understanding of the above orders entered into epic.  Abby Nena Jordan, MD  [facility] Endocrinology  [facility] [address] [place], [place] [address] Phone: [phone] FAX: [phone]

## 2018-04-16 NOTE — Telephone Encounter (Signed)
Labs faxed to PCP... When pt called he wanted to go over lab results, informed pt that these were not reviewed just yet by provider and made him aware that she was out of the office. Message was sent to Dr. Maretta Bees to review for pt. Nothing further is needed

## 2018-04-17 ENCOUNTER — Encounter: Payer: BLUE CROSS/BLUE SHIELD | Attending: Internal Medicine | Admitting: Dietician

## 2018-04-17 ENCOUNTER — Encounter: Payer: Self-pay | Admitting: Dietician

## 2018-04-17 DIAGNOSIS — R7989 Other specified abnormal findings of blood chemistry: Secondary | ICD-10-CM | POA: Insufficient documentation

## 2018-04-17 DIAGNOSIS — Z713 Dietary counseling and surveillance: Secondary | ICD-10-CM | POA: Diagnosis not present

## 2018-04-17 DIAGNOSIS — E119 Type 2 diabetes mellitus without complications: Secondary | ICD-10-CM

## 2018-04-17 NOTE — Progress Notes (Signed)
History includes Low testosterone, dyslipidemia, obesity, HTN, and OSA on C-pap.  He gets inadequate sleep and is looking for another job.  His boss follows a plant based diet.  Discussed this in today's visit.   Also, patient states that he experienced left chest, shoulder, and arm pain.  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    Weight hx: 292 lbs 1 1/2 years ago.  Lost weight with Isogenix but stopped due to cost then followed Advocare diet and lost weight but regained part of this. L=205 lbs lowest weight and gained when his wife was pregnant.  He now uses slimfast bid but eats a very large dinner due to inability to eat much during work and lack of advanced planning.  Patient lives with his wife and 70 yo daughter and 20 yo son.  His son is involved in a lot of sports.  They are traveling a lot on the weekends do to this. Patient works as a Administrator.  He wakes at 1:30 am and is home at 4:30.  He sleeps from 9:30/10:00-1 am.  He does not nap.  They share shopping and cooking.  Diabetes Self-Management Education  Visit Type: First/Initial  Appt. Start Time: 1030 Appt. End Time: 1200  04/17/2018  [name], identified by name and date of birth, is a 42 y.o. male with a diagnosis of Diabetes: Type 2.   ASSESSMENT  Height 5\' 10"  (1.778 m), weight 285 lb (129.3 kg). Body mass index is 40.89 kg/m.  Diabetes Self-Management Education - 04/17/18 1106      Visit Information   Visit Type  First/Initial      Initial Visit   Diabetes Type  Type 2    Are you currently following a meal plan?  No    Are you taking your medications as prescribed?  Not on Medications    Date Diagnosed  02/2018      Health Coping   How would you rate your overall health?  Good      Psychosocial Assessment   Patient Belief/Attitude about Diabetes  Motivated to manage diabetes    Self-care barriers  None    Self-management support  Doctor's office;Family    Other persons present   Patient    Patient Concerns  Nutrition/Meal planning;Weight Control    Special Needs  None    Preferred Learning Style  No preference indicated    Learning Readiness  Ready    How often do you need to have someone help you when you read instructions, pamphlets, or other written materials from your doctor or pharmacy?  1 - Never    What is the last grade level you completed in school?  12th grade      Pre-Education Assessment   Patient understands the diabetes disease and treatment process.  Needs Instruction    Patient understands incorporating nutritional management into lifestyle.  Needs Instruction    Patient undertands incorporating physical activity into lifestyle.  Needs Instruction    Patient understands using medications safely.  Needs Instruction    Patient understands monitoring blood glucose, interpreting and using results  Needs Instruction    Patient understands prevention, detection, and treatment of acute complications.  Needs Instruction    Patient understands prevention, detection, and treatment of chronic complications.  Needs Instruction    Patient understands how to develop strategies to address psychosocial issues.  Needs Instruction    Patient understands how to develop strategies to promote  health/change behavior.  Needs Instruction      Complications   Last HgB A1C per patient/outside source  --   per patient higher than 6.5% but unknown   How often do you check your blood sugar?  1-2 times/day    Fasting Blood glucose range (mg/dL)  130-179   139   Have you had a dilated eye exam in the past 12 months?  No    Have you had a dental exam in the past 12 months?  Yes    Are you checking your feet?  No      Dietary Intake   Breakfast  NABS or McDonald's Bacon, egg and cheese biscuit   9:30   Dinner  leftovers OR Chili, potato chips OR Meat, potatoes, vegetables   4:30-6   Snack (evening)  usually non OR bowl of sweet cereal and milk    Beverage(s)  propell  flavored water, occasional regular soft drink (was drinking 6-12 pack per day), diet soda      Exercise   Exercise Type  ADL's      Patient Education   Previous Diabetes Education  No    Disease state   Definition of diabetes, type 1 and 2, and the diagnosis of diabetes;Factors that contribute to the development of diabetes    Nutrition management   Role of diet in the treatment of diabetes and the relationship between the three main macronutrients and blood glucose level;Food label reading, portion sizes and measuring food.;Information on hints to eating out and maintain blood glucose control.;Meal options for control of blood glucose level and chronic complications.    Physical activity and exercise   Role of exercise on diabetes management, blood pressure control and cardiac health.    Monitoring  Purpose and frequency of SMBG.;Identified appropriate SMBG and/or A1C goals.;Daily foot exams;Yearly dilated eye exam    Chronic complications  Relationship between chronic complications and blood glucose control;Dental care    Psychosocial adjustment  Worked with patient to identify barriers to care and solutions;Role of stress on diabetes    Personal strategies to promote health  Lifestyle issues that need to be addressed for better diabetes care      Individualized Goals (developed by patient)   Nutrition  General guidelines for healthy choices and portions discussed    Physical Activity  Exercise 5-7 days per week;30 minutes per day    Medications  Not Applicable    Monitoring   test my blood glucose as discussed    Health Coping  discuss diabetes with (comment)   MD,RD, CDE     Post-Education Assessment   Patient understands the diabetes disease and treatment process.  Demonstrates understanding / competency    Patient understands incorporating nutritional management into lifestyle.  Demonstrates understanding / competency    Patient undertands incorporating physical activity into  lifestyle.  Demonstrates understanding / competency    Patient understands using medications safely.  Demonstrates understanding / competency    Patient understands monitoring blood glucose, interpreting and using results  Demonstrates understanding / competency    Patient understands prevention, detection, and treatment of acute complications.  Demonstrates understanding / competency    Patient understands prevention, detection, and treatment of chronic complications.  Demonstrates understanding / competency    Patient understands how to develop strategies to address psychosocial issues.  Demonstrates understanding / competency    Patient understands how to develop strategies to promote health/change behavior.  Demonstrates understanding / competency      Outcomes  Expected Outcomes  Demonstrated interest in learning. Expect positive outcomes    Future DMSE  PRN    Program Status  Completed       Individualized Plan for Diabetes Self-Management Training:   Learning Objective:  Patient will have a greater understanding of diabetes self-management. Patient education plan is to attend individual and/or group sessions per assessed needs and concerns.   Plan:   Patient Instructions  Call a Cardiologist Eat slowly.  Stop when you are satisfied. Increase your non starchy vegetable intake. Avoid sugar containing drinks. Stay as active as possible.  Aim for 30 minutes most days. Bake, grill, broil rather than fry. Trim the fat off of the meat and remove the skin from the chicken.  Buy lean meat.  Consider meatless option. Spread your carbohydrates throughout the day rather than mostly with supper. Small amount of protein with each meal or snack (nuts,peanut butter,  beans, meat, cheese, egg).   Expected Outcomes:  Demonstrated interest in learning. Expect positive outcomes  Education material provided: ADA Diabetes: Your Take Control Guide, Food label handouts, A1C conversion sheet, Meal  plan card, My Plate and Snack sheet, eating out tips, breakfast ideas  If problems or questions, patient to contact team via:  Phone  Future DSME appointment: PRN

## 2018-04-17 NOTE — Patient Instructions (Signed)
Call a Cardiologist Eat slowly.  Stop when you are satisfied. Increase your non starchy vegetable intake. Avoid sugar containing drinks. Stay as active as possible.  Aim for 30 minutes most days. Bake, grill, broil rather than fry. Trim the fat off of the meat and remove the skin from the chicken.  Buy lean meat.  Consider meatless option. Spread your carbohydrates throughout the day rather than mostly with supper. Small amount of protein with each meal or snack (nuts,peanut butter,  beans, meat, cheese, egg).

## 2018-04-23 ENCOUNTER — Other Ambulatory Visit: Payer: Self-pay | Admitting: Family Medicine

## 2018-04-23 DIAGNOSIS — R1900 Intra-abdominal and pelvic swelling, mass and lump, unspecified site: Secondary | ICD-10-CM

## 2018-04-28 ENCOUNTER — Ambulatory Visit
Admission: RE | Admit: 2018-04-28 | Discharge: 2018-04-28 | Disposition: A | Discharge status: Home / Self Care | Payer: BLUE CROSS/BLUE SHIELD | Source: Ambulatory Visit | Attending: Family Medicine | Admitting: Family Medicine

## 2018-04-28 DIAGNOSIS — R1900 Intra-abdominal and pelvic swelling, mass and lump, unspecified site: Secondary | ICD-10-CM

## 2018-04-28 MED ORDER — IOPAMIDOL (ISOVUE-300) INJECTION 61%
125.0000 mL | Freq: Once | INTRAVENOUS | Status: AC | PRN
  Administered 2018-04-28: 125 mL via INTRAVENOUS

## 2018-05-02 ENCOUNTER — Telehealth: Payer: Self-pay | Admitting: Internal Medicine

## 2018-05-02 NOTE — Telephone Encounter (Signed)
Patient called requesting an update regarding his MRI referral.  Please call at [phone]

## 2018-05-05 ENCOUNTER — Ambulatory Visit: Payer: BLUE CROSS/BLUE SHIELD | Admitting: Dietician

## 2018-05-05 NOTE — Telephone Encounter (Signed)
Referral has been placed and worked- I called the patient to let him know he should be hearing from GI to be scheduled

## 2018-05-12 ENCOUNTER — Other Ambulatory Visit: Payer: Self-pay | Admitting: Cardiology

## 2018-05-12 MED ORDER — LISINOPRIL-HYDROCHLOROTHIAZIDE 20-25 MG PO TABS: 1.0000 | ORAL_TABLET | Freq: Every day | ORAL | 0 refills | Status: DC

## 2018-05-12 NOTE — Telephone Encounter (Signed)
Patient is out of medication, has appt 06/04/18   1. Which medications need to be refilled? (please list name of each medication and dose if known) Lisinopril hctz 20-25mg  tab 1QD  2. Which pharmacy/location (including street and city if local pharmacy) is medication to be sent to?[facility] [address] [facility]  3. Do they need a 30 day or 90 day supply? [place]

## 2018-05-12 NOTE — Telephone Encounter (Signed)
Lisinopril-hydrochlorothiazide 25 mg daily refilled per Dr. Agustin Cree.

## 2018-05-17 ENCOUNTER — Encounter

## 2018-05-26 ENCOUNTER — Ambulatory Visit
Admission: RE | Admit: 2018-05-26 | Discharge: 2018-05-26 | Disposition: A | Discharge status: Home / Self Care | Payer: BLUE CROSS/BLUE SHIELD | Source: Ambulatory Visit | Attending: Internal Medicine | Admitting: Internal Medicine

## 2018-05-26 DIAGNOSIS — E230 Hypopituitarism: Secondary | ICD-10-CM

## 2018-05-26 MED ORDER — GADOBENATE DIMEGLUMINE 529 MG/ML IV SOLN
10.0000 mL | Freq: Once | INTRAVENOUS | Status: AC | PRN
  Administered 2018-05-26: 10 mL via INTRAVENOUS

## 2018-05-27 ENCOUNTER — Telehealth: Payer: Self-pay | Admitting: Internal Medicine

## 2018-05-27 DIAGNOSIS — D352 Benign neoplasm of pituitary gland: Secondary | ICD-10-CM

## 2018-05-27 DIAGNOSIS — G930 Cerebral cysts: Secondary | ICD-10-CM

## 2018-05-27 NOTE — Telephone Encounter (Signed)
Discussed his MRI results as below   SELLA: No abnormal sellar expansion. Normal posterior pituitary bright spot. 3 mm rounded focus of early hypoenhancement which fills in on delayed phase within RIGHT pituitary gland. Less than 180 degrees connectivity RIGHT cavernous sinus. Pituitary stalk is midline with normal enhancement. Symmetric appearance of the cavernous sinuses and cavernous sinus flow voids. Normal appearance of the overlying optic chiasm.  INTRACRANIAL CONTENTS: No reduced diffusion to suggest acute ischemia or hypercellular tumor. The ventricles and sulci are normal for patient's age. 3 mm RIGHT sub appended mole nodule. No suspicious parenchymal signal, masses or midline shift. No abnormal intraparenchymal or extra-axial enhancement. 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 No abnormal extra-axial fluid collections.  VASCULAR: Normal major intracranial vascular flow voids present at skull base.  ORBITS: The ocular globes and orbital contents are non-suspicious.  SINUSES: The mastoid air-cells and included paranasal sinuses are well-aerated.  SKULL/SOFT TISSUES: No suspicious calvarial bone marrow signal. Scalloping of RIGHT calvarium due to arachnoid cyst craniocervical junction maintained.  IMPRESSION: 1. 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 2. 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 3. Non-specific 3 mm RIGHT subependymal nodule.  - Pt has occasional headaches for years, nothing out of the ordinary   - Will check IGF-1 , TFT , ACTH and cortisl.Pt advised to present for labs early in the morning, will consider cosyntropin stimulation test depending on cortisol results - Will refer to neurosurgery , urgently for further evaluation of arachnoid cyst with mass effect   Abby Nena Jordan, MD  [facility]  [facility] [address] [place], [place] [address] Phone: [phone] FAX: [phone]

## 2018-05-28 ENCOUNTER — Telehealth: Payer: Self-pay | Admitting: Internal Medicine

## 2018-05-28 ENCOUNTER — Other Ambulatory Visit (INDEPENDENT_AMBULATORY_CARE_PROVIDER_SITE_OTHER): Payer: BLUE CROSS/BLUE SHIELD

## 2018-05-28 ENCOUNTER — Other Ambulatory Visit: Payer: Self-pay

## 2018-05-28 ENCOUNTER — Other Ambulatory Visit: Payer: Self-pay | Admitting: Internal Medicine

## 2018-05-28 DIAGNOSIS — D352 Benign neoplasm of pituitary gland: Secondary | ICD-10-CM

## 2018-05-28 DIAGNOSIS — G930 Cerebral cysts: Secondary | ICD-10-CM

## 2018-05-28 LAB — T4, FREE: FREE T4: 0.67 ng/dL (ref 0.60–1.60)

## 2018-05-28 LAB — CORTISOL: Cortisol, Plasma: 4.6 ug/dL

## 2018-05-28 LAB — TSH: TSH: 1.32 u[IU]/mL (ref 0.35–4.50)

## 2018-05-28 NOTE — Telephone Encounter (Signed)
Called pt to make aware. States he would prefer to wait to see neurosurgeon. If neurosurgeon feels pt should be off for any extended period of time, advised the surgeon is welcome to complete any disability forms required by his employer.

## 2018-05-28 NOTE — Telephone Encounter (Signed)
Please advise 

## 2018-05-28 NOTE — Telephone Encounter (Signed)
Called pt and made him aware. Verbalized acceptance and understanding. 

## 2018-05-28 NOTE — Telephone Encounter (Signed)
Patient has called in regards to message sent before. Patient has another question. Please advise, thanks

## 2018-05-28 NOTE — Telephone Encounter (Signed)
Returned pt call to inquire further. 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 Please advise.

## 2018-05-28 NOTE — Telephone Encounter (Signed)
Patient asked after lab work what they need to do now before hearing from neurosurgery. Please Advise, thanks

## 2018-05-30 ENCOUNTER — Telehealth: Payer: Self-pay | Admitting: Internal Medicine

## 2018-05-30 NOTE — Telephone Encounter (Signed)
error 

## 2018-06-02 ENCOUNTER — Other Ambulatory Visit (INDEPENDENT_AMBULATORY_CARE_PROVIDER_SITE_OTHER): Payer: BLUE CROSS/BLUE SHIELD

## 2018-06-02 ENCOUNTER — Telehealth (INDEPENDENT_AMBULATORY_CARE_PROVIDER_SITE_OTHER): Payer: BLUE CROSS/BLUE SHIELD | Admitting: Internal Medicine

## 2018-06-02 DIAGNOSIS — D352 Benign neoplasm of pituitary gland: Secondary | ICD-10-CM

## 2018-06-02 LAB — CORTISOL
CORTISOL PLASMA: 8.6 ug/dL
Cortisol, Plasma: 25.1 ug/dL
Cortisol, Plasma: 31.9 ug/dL

## 2018-06-02 LAB — ACTH: C206 ACTH: 9 pg/mL (ref 6–50)

## 2018-06-02 LAB — INSULIN-LIKE GROWTH FACTOR
IGF-I, LC/MS: 96 ng/mL (ref 52–328)
Z-SCORE (MALE): -0.8 {STDV} (ref ?–2.0)

## 2018-06-02 MED ORDER — COSYNTROPIN 0.25 MG IJ SOLR
0.2500 mg | Freq: Once | INTRAMUSCULAR | Status: AC
  Administered 2018-06-02: 0.25 mg via INTRAVENOUS

## 2018-06-02 NOTE — Addendum Note (Signed)
Addended by: Kaylyn Lim I on: 06/02/2018 10:50 AM   Modules accepted: Orders

## 2018-06-02 NOTE — Addendum Note (Signed)
Addended by: Kaylyn Lim I on: 06/02/2018 10:19 AM   Modules accepted: Orders

## 2018-06-02 NOTE — Telephone Encounter (Addendum)
Left a message for a call back.    Results for ELFEGO, GIAMMARINO (MRN [id number]) as of 06/02/2018 07:39  Ref. Range 05/28/2018 08:30  Cortisol, Plasma Latest Units: ug/dL 4.6  TSH Latest Ref Range: 0.35 - 4.50 uIU/mL 1.32  T4,Free(Direct) Latest Ref Range: 0.60 - 1.60 ng/dL 0.67  C206 ACTH Latest Ref Range: 6 - 50 pg/mL 9   Normal TFT's Due to low normal ACTH and plasma cortisol will need to proceed with Cosyntropin stimulation test.  Discussed above with pt , he is in agreement with the above     Abby Nena Jordan, MD  [facility]  [facility] [address] [place], [place] [address] Phone: [place]: [phone]

## 2018-06-02 NOTE — Progress Notes (Signed)
Injection of Cosyntropin 0.25mg  given in left deltoid muscle per orders of Dr. Luvenia Heller. Pt tolerated well.

## 2018-06-04 ENCOUNTER — Ambulatory Visit (INDEPENDENT_AMBULATORY_CARE_PROVIDER_SITE_OTHER): Payer: BLUE CROSS/BLUE SHIELD | Admitting: Cardiology

## 2018-06-04 ENCOUNTER — Encounter: Payer: Self-pay | Admitting: Cardiology

## 2018-06-04 ENCOUNTER — Telehealth: Payer: Self-pay | Admitting: Internal Medicine

## 2018-06-04 VITALS — BP 128/62 | HR 93 | Ht 70.0 in | Wt 283.0 lb

## 2018-06-04 DIAGNOSIS — I10 Essential (primary) hypertension: Secondary | ICD-10-CM | POA: Insufficient documentation

## 2018-06-04 DIAGNOSIS — E782 Mixed hyperlipidemia: Secondary | ICD-10-CM

## 2018-06-04 DIAGNOSIS — G930 Cerebral cysts: Secondary | ICD-10-CM

## 2018-06-04 NOTE — Telephone Encounter (Signed)
Pt called on 12/17 following his neurosurgery consult and is requesting a referral for a second opinion on his arachnoid cyst.   Pt was told no intervention at this time and will need repeat imaging in a few months.     Pt would like to be referred to Dr. Luna Kitchens At [phone]  Referral entered.     Abby Nena Jordan, MD  [facility]  [facility] [address] [place], [place] [address] Phone: [phone] FAX: [phone]

## 2018-06-04 NOTE — Progress Notes (Signed)
Cardiology Office Note:    Date:  06/04/2018   ID:  Cody Sullivan, DOB 02-Mar-1976, MRN [id number]  PCP:  Dineen Kid, MD  Cardiologist:  Jenean Lindau, MD   Referring MD: Dineen Kid, MD    ASSESSMENT:    1. Mixed hyperlipidemia   2. Essential hypertension    PLAN:    In order of problems listed above:  1. Primary prevention stressed with the patient.  Importance of compliance with diet and medication stressed and he vocalized understanding. 2. Diet was discussed with dyslipidemia and obesity and risks explained and he vocalized understanding.  He plans to do better with diet and exercise to reduce weight. 3. His blood pressure is stable.  I told him that if he needs neurosurgery then he would like to talk to his surgeon about preoperative risk evaluation and he understands this discussion and will discuss this with his surgeon appropriately. 4. Patient will be seen in follow-up appointment in 6 months or earlier if the patient has any concerns    Medication Adjustments/Labs and Tests Ordered: Current medicines are reviewed at length with the patient today.  Concerns regarding medicines are outlined above.  No orders of the defined types were placed in this encounter.  No orders of the defined types were placed in this encounter.    History of Present Illness:    Cody Sullivan is a 42 y.o. male who is being seen today for the evaluation of essential hypertension and to get established at the request of Via, Lennette Bihari, MD.  Patient is a pleasant 42 year old male.  He has past medical history of essential hypertension.  Patient mentions to me that he has seen Dr. Wynonia Lawman in the past.  He has history of essential hypertension, dyslipidemia and morbid obesity.  He is here for follow-up.  He denies any chest pain orthopnea or PND.  He has a cyst in his brain recently diagnosed and is going to have an appointment with his neurosurgeon.  At the time of my evaluation, the patient is alert  awake oriented and in no distress.  Past Medical History:  Diagnosis Date  . Allergy   . Diabetes mellitus without complication ([place])   . Hypertension   . Kidney stones   . Sleep apnea    cpap    Past Surgical History:  Procedure Laterality Date  . ANKLE SURGERY  2003   right   . cracked chest  2007   equipment fell on chest and cracked his chest, had pain related to injury, cardiologist confirmed not related to heart.    Marland Kitchen LITHOTRIPSY  2010  . nose fracture  2000    Current Medications: Current Meds  Medication Sig  . Blood Glucose Monitoring Suppl (CONTOUR NEXT EZ) w/Device KIT 1 Device by Does not apply route 2 (two) times daily.  Marland Kitchen glucose blood test strip Use as instructed  . lisinopril-hydrochlorothiazide (PRINZIDE,ZESTORETIC) 20-25 MG tablet Take 1 tablet by mouth daily.  Marland Kitchen MICROLET LANCETS MISC Use to check blood sugars once a day with Contour Next glucometer  . minocycline (MINOCIN,DYNACIN) 50 MG capsule Take 50 mg by mouth 2 (two) times daily.     Allergies:   Patient has no known allergies.   Social History   Socioeconomic History  . Marital status: Married    Spouse name: Not on file  . Number of children: Not on file  . Years of education: Not on file  . Highest education level: Not on file  Occupational History  . Not on file  Social Needs  . Financial resource strain: Not on file  . Food insecurity:    Worry: Not on file    Inability: Not on file  . Transportation needs:    Medical: Not on file    Non-medical: Not on file  Tobacco Use  . Smoking status: Never Smoker  . Smokeless tobacco: Never Used  Substance and Sexual Activity  . Alcohol use: No    Alcohol/week: 0.0 standard drinks  . Drug use: No  . Sexual activity: Not on file  Lifestyle  . Physical activity:    Days per week: Not on file    Minutes per session: Not on file  . Stress: Not on file  Relationships  . Social connections:    Talks on phone: Not on file    Gets  together: Not on file    Attends religious service: Not on file    Active member of club or organization: Not on file    Attends meetings of clubs or organizations: Not on file    Relationship status: Not on file  Other Topics Concern  . Not on file  Social History Narrative  . Not on file     Family History: The patient's family history is not on file.  ROS:   Please see the history of present illness.    All other systems reviewed and are negative.  EKGs/Labs/Other Studies Reviewed:    The following studies were reviewed today: I discussed my findings with the patient at extensive length.   Recent Labs: 07/25/2017: BUN 9; Creatinine, Ser 0.89; Hemoglobin 14.7; Platelets 319; Potassium 3.7; Sodium 137 05/28/2018: TSH 1.32  Recent Lipid Panel No results found for: CHOL, TRIG, HDL, CHOLHDL, VLDL, LDLCALC, LDLDIRECT  Physical Exam:    VS:  BP 128/62 (BP Location: Right Arm, Patient Position: Sitting, Cuff Size: Normal)   Pulse 93   Ht 5' 10" (1.778 m)   Wt 283 lb (128.4 kg)   SpO2 98%   BMI 40.61 kg/m     Wt Readings from Last 3 Encounters:  06/04/18 283 lb (128.4 kg)  04/17/18 285 lb (129.3 kg)  04/10/18 285 lb 6.4 oz (129.5 kg)     GEN: Patient is in no acute distress HEENT: Normal NECK: No JVD; No carotid bruits LYMPHATICS: No lymphadenopathy CARDIAC: S1 S2 regular, 2/6 systolic murmur at the apex. RESPIRATORY:  Clear to auscultation without rales, wheezing or rhonchi  ABDOMEN: Soft, non-tender, non-distended MUSCULOSKELETAL:  No edema; No deformity  SKIN: Warm and dry NEUROLOGIC:  Alert and oriented x 3 PSYCHIATRIC:  Normal affect    Signed, Jenean Lindau, MD  06/04/2018 4:29 PM    [place]

## 2018-06-04 NOTE — Telephone Encounter (Signed)
Patient dropped off FMLA paperwork and it was been placed in Dr. Quin Hoop box.

## 2018-06-04 NOTE — Patient Instructions (Signed)
Medication Instructions:  Your physician recommends that you continue on your current medications as directed. Please refer to the Current Medication list given to you today.  If you need a refill on your cardiac medications before your next appointment, please call your pharmacy.   Lab work: None  If you have labs (blood work) drawn today and your tests are completely normal, you will receive your results only by: Marland Kitchen MyChart Message (if you have MyChart) OR . A paper copy in the mail If you have any lab test that is abnormal or we need to change your treatment, we will call you to review the results.  Testing/Procedures: None  Follow-Up: At [facility], you and your health needs are our priority.  As part of our continuing mission to provide you with exceptional heart care, we have created designated Provider Care Teams.  These Care Teams include your primary Cardiologist (physician) and Advanced Practice Providers (APPs -  Physician Assistants and Nurse Practitioners) who all work together to provide you with the care you need, when you need it.  You will need a follow up appointment in 6 months.  Please call our office 2 months in advance to schedule this appointment.  You may see another member of our [employer] Provider Team in [place]: Jenne Campus, MD . Shirlee More, MD  Any Other Special Instructions Will Be Listed Below (If Applicable).

## 2018-06-04 NOTE — Telephone Encounter (Signed)
I did tr to get patient scheduled with Dr. Pervis Hocking. I asked Olen Cordial to print out snapshot,insurance information,and MRI results-before he can be scheduled we have to have office note about the arachnoid cyst- can you addend last office note or does he need to be seen again please advise

## 2018-06-04 NOTE — Telephone Encounter (Signed)
Please be advised Pt FMLA paperwork as been placed in your to sign box. Sorry for the mix up.

## 2018-06-04 NOTE — Telephone Encounter (Signed)
I wanted to make sure patient didn't need to be seen again since the office we are referring to has to have office note about the problem they would not take phone encounters-I have asked Cody Sullivan to print all of the necessary paperwork and fax it to Cody Sullivan office

## 2018-06-04 NOTE — Telephone Encounter (Signed)
Patient stated he dropped of his FMLA paperwork a while back. I looked in your boxes at the front desk and did not see any paperwork. Do you have/recall receiving these form ?

## 2018-06-05 NOTE — Telephone Encounter (Signed)
Pt made aware fMLA paperwork was completed by Dr. Kelton Pillar. Pt notified paperwork was ready for pick up.

## 2018-06-05 NOTE — Telephone Encounter (Signed)
Pt made aware FMLA paper has been completed by Dr Kelton Pillar and ready for pick up. Pt expressed understanding.

## 2018-06-05 NOTE — Telephone Encounter (Signed)
Pt came inn a picked up FMLA and informed of up coming appt. With neurosurgeon Dr. Owens Shark on 07/15/2018 at 7:30 am. Pt provided with contact information and advised to f/u. Verbalized understanding.

## 2018-06-08 ENCOUNTER — Other Ambulatory Visit: Payer: Self-pay | Admitting: Cardiology

## 2018-06-19 ENCOUNTER — Telehealth: Payer: Self-pay | Admitting: Internal Medicine

## 2018-06-19 NOTE — Telephone Encounter (Signed)
Telephone call returned to patient, pt wanted to make an appt. for he wife to see Dr. Kelton Pillar. Pt. Transferred to scheduling for assistance's.

## 2018-06-19 NOTE — Telephone Encounter (Signed)
Patient called stating needing to speak with Dr Maretta Bees or Roderic Ovens. Did not indicate why.  Patient is requesting a call back at [phone]

## 2018-07-03 ENCOUNTER — Telehealth: Payer: Self-pay

## 2018-07-03 NOTE — Telephone Encounter (Signed)
Called pt and left detailed voicemail for pt to call back and reschedule appt.

## 2018-07-07 ENCOUNTER — Ambulatory Visit: Payer: BLUE CROSS/BLUE SHIELD | Admitting: Internal Medicine

## 2018-07-21 ENCOUNTER — Ambulatory Visit (INDEPENDENT_AMBULATORY_CARE_PROVIDER_SITE_OTHER): Payer: BLUE CROSS/BLUE SHIELD | Admitting: Internal Medicine

## 2018-07-21 ENCOUNTER — Encounter: Payer: Self-pay | Admitting: Internal Medicine

## 2018-07-21 VITALS — BP 142/78 | HR 77 | Ht 70.0 in | Wt 289.4 lb

## 2018-07-21 DIAGNOSIS — G930 Cerebral cysts: Secondary | ICD-10-CM

## 2018-07-21 DIAGNOSIS — D352 Benign neoplasm of pituitary gland: Secondary | ICD-10-CM

## 2018-07-21 DIAGNOSIS — R7989 Other specified abnormal findings of blood chemistry: Secondary | ICD-10-CM

## 2018-07-21 DIAGNOSIS — E119 Type 2 diabetes mellitus without complications: Secondary | ICD-10-CM

## 2018-07-21 LAB — POCT GLYCOSYLATED HEMOGLOBIN (HGB A1C): HEMOGLOBIN A1C: 5.9 % — AB (ref 4.0–5.6)

## 2018-07-21 LAB — GLUCOSE, POCT (MANUAL RESULT ENTRY): POC Glucose: 165 mg/dl — AB (ref 70–99)

## 2018-07-21 NOTE — Progress Notes (Signed)
Name: Cody Sullivan  Age/ Sex: 43 y.o., male   MRN/ DOB: [id number], [birth year]     PCP: Dineen Kid, MD   Reason for Endocrinology Evaluation: Type 2 Diabetes Mellitus  Initial Endocrine Consultative Visit: 04/10/18    PATIENT IDENTIFIER: Cody Sullivan is a 43 y.o. male with a past medical history of OSA on CPAP, dyslipidemia and obesity . The patient has followed with Endocrinology clinic since 04/10/18 for consultative assistance with management of his diabetes.  DIABETIC HISTORY:  Cody Sullivan was diagnosed with T2DM in 2019, her was offered Metformin at the time of his diagnosis by his PCP., but the patient opted for diet control and lifestyle changes. His hemoglobin A1c has ranged from 6.3%  in 10/2017, peaking at 6.5% in 03/2018 .    In terms of low Testosterone:  He has been tired for 2 years, he has noted somewhat decrease in libido, no difficulty with erections. He has noted gynecomastia for a while, no pain or discharge from the nipples. He also noted a subjective decrease in testicular size ~ 2 months ago. He has 2 biological kids. He has not noticed any decrease in frequency of shaving.  He has chronic occasional headaches, no worsening noted. Denies vision changes.    No known FH  of prostate cancer.   SUBJECTIVE:    Today (07/22/2018): Cody Sullivan is here for a 3 month follow up on his diabetes managment.  He checks his blood sugars 1 time daily, preprandial to breakfast. The patient has not had hypoglycemic episodes since the last clinic visit. Otherwise, the patient has not required any recent emergency interventions for hypoglycemia and has not had recent hospitalizations secondary to hyper or hypoglycemic episodes.    In term of his low testosterone (126 ng/dL), with inappropriately normal LH/FSH a pituitary MRI was preformed which showed a pituitary microadenoma as well as a large middle  cranial fossa with mass effect.   He has seen  Dr. Alfred Levins on 07/15/18, and in the process of scheduling him for surgery.  He recently saw an ophthalmologist with no evidence of retinopathy, new glasses prescribed.   ROS: As per HPI and as detailed below: Review of Systems  Eyes: Positive for blurred vision. Negative for pain.  Neurological: Positive for headaches.       Chronic Headaches   Psychiatric/Behavioral: Negative for depression. The patient is not nervous/anxious.       HOME DIABETES REGIMEN:  N/A  Statin:  no ACE-I/ARB: yes    METER DOWNLOAD : Did not bring meter  Fasting 130's mg/dL (memory recall )    DIABETIC COMPLICATIONS: Microvascular complications:    Denies: Neuropathy, CKD or retinopathy   Last Eye Exam: Completed 06/2018  Macrovascular complications:    Denies: CAD, CVA, PVD   HISTORY:  Past Medical History:  Past Medical History:  Diagnosis Date  . Allergy   . Diabetes mellitus without complication ([place])   . Hypertension   . Kidney stones   . Sleep apnea    cpap   Past Surgical History:  Past Surgical History:  Procedure Laterality Date  . ANKLE SURGERY  2003   right   . cracked chest  2007   equipment fell on chest and cracked his chest, had pain related to injury, cardiologist confirmed not related to heart.    Marland Kitchen LITHOTRIPSY  2010  . nose fracture  2000    Social History:  reports that he has never smoked. He has  never used smokeless tobacco. He reports that he does not drink alcohol or use drugs.  Family History: No family history of prostate ca.    HOME MEDICATIONS: Allergies as of 07/21/2018   No Known Allergies     Medication List       Accurate as of July 21, 2018 11:59 PM. Always use your most recent med list.        CONTOUR NEXT EZ w/Device Kit 1 Device by Does not apply route 2 (two) times daily.   glucose blood test strip Use as instructed   lisinopril-hydrochlorothiazide 20-25 MG  tablet Commonly known as:  PRINZIDE,ZESTORETIC TAKE 1 TABLET BY MOUTH EVERY DAY   MICROLET LANCETS Misc Use to check blood sugars once a day with Contour Next glucometer   minocycline 50 MG capsule Commonly known as:  MINOCIN,DYNACIN Take 50 mg by mouth 2 (two) times daily.         OBJECTIVE:   Vital Signs: BP (!) 142/78 (BP Location: Left Arm, Patient Position: Sitting, Cuff Size: Normal)   Pulse 77   Ht '5\' 10"'$  (1.778 m)   Wt 289 lb 6.4 oz (131.3 kg)   SpO2 97%   BMI 41.52 kg/m   Wt Readings from Last 3 Encounters:  07/21/18 289 lb 6.4 oz (131.3 kg)  06/04/18 283 lb (128.4 kg)  04/17/18 285 lb (129.3 kg)     Exam: General: Pt appears well and is in NAD  Lungs: Clear with good BS bilat with no rales, rhonchi, or wheezes  Heart: RRR with normal S1 and S2 and no gallops; no murmurs; no rub  Abdomen: Normoactive bowel sounds, soft, nontender, without masses or organomegaly palpable  Extremities: No pretibial edema. No tremor. Normal strength and motion throughout. See detailed diabetic foot exam below.  Skin: Normal texture and temperature to palpation. No rash noted. No Acanthosis nigricans/skin tags. No lipohypertrophy.  Neuro: MS is good with appropriate affect, pt is alert and Ox3     DM foot exam: 04/10/18 The skin of the feet is intact without sores or ulcerations. Patient does have a callous formation at the left 1st MTJ The pedal pulses are 2+ on right and 2+ on left. The sensation is absent to a screening 5.07, 10 gram monofilament bilaterally at the toes.           DATA REVIEWED:  Lab Results  Component Value Date   HGBA1C 5.9 (A) 07/21/2018   HGBA1C 6.2 12/22/2014   Lab Results  Component Value Date   CREATININE 0.89 07/25/2017   Brain MRI (05/26/28) 1. 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 2. 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 3. Non-specific 3 mm RIGHT subependymal nodule.    ASSESSMENT / PLAN /  RECOMMENDATIONS:   1)  Type 2 Diabetes Mellitus, Newly Diagnosed with neuropathy - Most recent A1c of 5.9 %. Goal A1c < 7.0 %.    - He has not been able to make drastic lifestyle changes given the current circumstances of brain cysts and him being very anxious about it, but despite that his A1c has trended down.  - I again have emphasized that importance of avoiding sugar-sweetened beverages and a low carb diet.  - We discussed the importance of proper glycemic control to prevent/slow down diabetes complications.   EDUCATION / INSTRUCTIONS:  BG monitoring instructions: Patient is instructed to check his blood sugars 2 times a day, fasting and bedtime.  Call [facility] if: BG persistently <  70 or > 300. . I reviewed the Rule of 15 for the treatment of hypoglycemia in detail with the patient. Literature supplied.    2) Diabetic complications:   Eye: Does not have known diabetic retinopathy. Last eye exam was   Neuro/ Feet: Does have known diabetic peripheral neuropathy. Based on exam   Renal: Patient does have known baseline CKD. He is on an ACEI/ARB at present.   3) Lipids: Per ADA guidelines, moderate intensity statin may be considered if LDL >100 mg/dL for adults aged > 40 with DM. Will defer to PCP    4) Hypertension: He is at goal of < 140/90 mmHg.   5) Pituitary Microadenoma:   - Will need a repeat MRI in 6-12 months.  - Clinically and biochemicaly there's no evidence of cortisol,or  growth hormone excess or deficiency.  - TFT's are normal.   6) Hypogonadotrophic hypogonadism:   - We discussed side effects of testosterone therapy such as erythropoiesis, worsening of sleep apnea that is severe and untreated,  Prostate volumes and serum PSA increase in response to testosterone treatment which might increase BPH and worsen urinary outflow obstruction as well as prostate cancer risk.  - We also discussed there is a possibility of increased  cardiovascular risk associated with testosterone use. - We have discussed the options of starting testosterone therapy at this time vs waiting a few months and rechecking his testosterone level to see if his axis recovers after the surgery ( As I am wondering if the cyst has to do with his HPG axis suppression )    7) Large arachnoid cyst:   - Pt is seeing Dr. Owens Shark for surgical evaluation.   F/U in 3 months    Signed electronically by: Mack Guise, MD  [facility] Endocrinology  [facility] [address] [place], [place] [address] Phone: [phone] FAX: [phone]   CC: Dineen Kid, [place] [address] Phone: [phone]  Fax: [phone]  Return to Endocrinology clinic as below: Future Appointments  Date Time Provider [place]  10/20/2018  1:20 PM Shamleffer, Melanie Crazier, MD [facility] None

## 2018-07-21 NOTE — Patient Instructions (Addendum)
-   Please avoid sugar-sweetened drinks and avoid snacking.

## 2018-07-22 DIAGNOSIS — E119 Type 2 diabetes mellitus without complications: Secondary | ICD-10-CM | POA: Insufficient documentation

## 2018-07-22 DIAGNOSIS — G930 Cerebral cysts: Secondary | ICD-10-CM | POA: Insufficient documentation

## 2018-07-22 DIAGNOSIS — R7989 Other specified abnormal findings of blood chemistry: Secondary | ICD-10-CM | POA: Insufficient documentation

## 2018-07-22 DIAGNOSIS — D352 Benign neoplasm of pituitary gland: Secondary | ICD-10-CM | POA: Insufficient documentation

## 2018-07-29 ENCOUNTER — Ambulatory Visit: Payer: BLUE CROSS/BLUE SHIELD | Admitting: Internal Medicine

## 2018-08-04 HISTORY — PX: CRANIOTOMY: SHX93

## 2018-08-14 ENCOUNTER — Other Ambulatory Visit: Payer: Self-pay | Admitting: Cardiology

## 2018-10-20 ENCOUNTER — Ambulatory Visit (INDEPENDENT_AMBULATORY_CARE_PROVIDER_SITE_OTHER): Payer: BLUE CROSS/BLUE SHIELD | Admitting: Internal Medicine

## 2018-10-20 ENCOUNTER — Encounter: Payer: Self-pay | Admitting: Internal Medicine

## 2018-10-20 ENCOUNTER — Other Ambulatory Visit: Payer: Self-pay

## 2018-10-20 VITALS — BP 110/64 | Temp 97.4°F | Ht 70.0 in | Wt 288.6 lb

## 2018-10-20 DIAGNOSIS — R7989 Other specified abnormal findings of blood chemistry: Secondary | ICD-10-CM | POA: Diagnosis not present

## 2018-10-20 DIAGNOSIS — D352 Benign neoplasm of pituitary gland: Secondary | ICD-10-CM

## 2018-10-20 DIAGNOSIS — E119 Type 2 diabetes mellitus without complications: Secondary | ICD-10-CM

## 2018-10-20 DIAGNOSIS — R5383 Other fatigue: Secondary | ICD-10-CM

## 2018-10-20 LAB — POCT GLYCOSYLATED HEMOGLOBIN (HGB A1C): Hemoglobin A1C: 6.3 % — AB (ref 4.0–5.6)

## 2018-10-20 LAB — GLUCOSE, POCT (MANUAL RESULT ENTRY): POC Glucose: 134 mg/dl — AB (ref 70–99)

## 2018-10-20 NOTE — Patient Instructions (Addendum)
-   please stop by the office in 2 weeks , to have blood drawn  - Please make sure you are fasting

## 2018-10-20 NOTE — Progress Notes (Signed)
Name: Cody Sullivan  Age/ Sex: 43 y.o., male   MRN/ DOB: [id number], 1976-03-08     PCP: Kristen Loader, FNP   Reason for Endocrinology Evaluation: Type 2 Diabetes Mellitus  Initial Endocrine Consultative Visit: 04/10/18    PATIENT IDENTIFIER: Cody Sullivan is a 43 y.o. male with a past medical history of OSA on CPAP, dyslipidemia and obesity . The patient has followed with Endocrinology clinic since 04/10/18 for consultative assistance with management of his diabetes.  DIABETIC HISTORY:  Mr. Traweek was diagnosed with T2DM in 2019, her was offered Metformin at the time of his diagnosis by his PCP., but the patient opted for diet control and lifestyle changes. His hemoglobin A1c has ranged from 6.3%  in 10/2017, peaking at 6.5% in 03/2018 .    In terms of low Testosterone:   He was found to have a low testosterone in 03/2018 ( T. Testosterone 126 ng/dL) with inappropriately normal LH/FSH. 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   He is S/P Right frontal craniotomy on 08/04/2018 through Dr. Alfred Levins   He has been tired since 2017, he has noted somewhat decrease in libido, no difficulty with erections. He has noted gynecomastia for a while, no pain or discharge from the nipples. He also noted a subjective decrease in testicular size ~ 01/2018. He has 2 biological kids. He has not noticed any decrease in frequency of shaving.  He has chronic occasional headaches.   No known FH  of prostate cancer.   SUBJECTIVE:    Today (10/20/2018): [name] is here for a 3 month follow up on his diabetes management and hypogonadism.  He is accompanied by his wife today. He checks his blood sugars 1 time daily, preprandial to breakfast. The patient has not had hypoglycemic episodes since the last clinic visit.  Otherwise, the patient has not required any recent emergency interventions for hypoglycemia and has not had recent hospitalizations secondary to hyper or hypoglycemic episodes.   He is S/P right frontal craniotomy on 08/04/2018 for arachnoid cysts. He is recovering well except for the occasional headaches.  He is c/o severe fatigue since his surgery. In term of his low testosterone he denies erectile dysfunction, he does have decreased libido but denies depression.     ROS: As per HPI and as detailed below: Review of Systems  Constitutional: Negative for weight loss.  Eyes: Negative for blurred vision and pain.  Neurological: Positive for headaches. Negative for dizziness.       Chronic Headaches   Psychiatric/Behavioral: Negative for depression. The patient is not nervous/anxious.       HOME DIABETES REGIMEN:  N/A  Statin:  no ACE-I/ARB: yes    METER DOWNLOAD : Did not bring meter  Fasting less then 150 mg/dL (memory recall )    DIABETIC COMPLICATIONS: Microvascular complications:    Denies: Neuropathy, CKD or retinopathy   Last Eye Exam: Completed 06/2018  Macrovascular complications:    Denies: CAD, CVA, PVD   HISTORY:  Past Medical History:  Past Medical History:  Diagnosis Date   Allergy    Diabetes mellitus without complication ([place])    Hypertension    Kidney stones    Sleep apnea    cpap   Past Surgical History:  Past Surgical History:  Procedure Laterality Date   ANKLE SURGERY  2003   right  cracked chest  2007   equipment fell on chest and cracked his chest, had pain related to injury, cardiologist confirmed not related to heart.     CRANIOTOMY Right 08/04/2018   Right frontal - cyst removal   LITHOTRIPSY  2010   nose fracture  2000    Social History:  reports that he has never smoked. He has never used smokeless tobacco. He reports that he does not drink alcohol or use drugs.  Family History: No family history of prostate ca.     HOME MEDICATIONS: Allergies as of 10/20/2018   No Known Allergies     Medication List       Accurate as of Oct 20, 2018  3:38 PM. Always use your most recent med list.        Contour Next EZ w/Device Kit 1 Device by Does not apply route 2 (two) times daily.   glucose blood test strip Use as instructed   levETIRAcetam 500 MG tablet Commonly known as:  KEPPRA   lisinopril-hydrochlorothiazide 20-25 MG tablet Commonly known as:  ZESTORETIC TAKE 1 TABLET BY MOUTH EVERY DAY   Microlet Lancets Misc Use to check blood sugars once a day with Contour Next glucometer   minocycline 50 MG capsule Commonly known as:  MINOCIN Take 50 mg by mouth 2 (two) times daily.         OBJECTIVE:   Vital Signs: BP 110/64    Temp (!) 97.4 F (36.3 C)    Ht _0  (1.778 m)    Wt 288 lb 9.6 oz (130.9 kg)    BMI 41.41 kg/m   Wt Readings from Last 3 Encounters:  10/20/18 288 lb 9.6 oz (130.9 kg)  07/21/18 289 lb 6.4 oz (131.3 kg)  06/04/18 283 lb (128.4 kg)     Exam: General: Pt appears well and is in NAD  Lungs: Clear with good BS bilat with no rales, rhonchi, or wheezes  Heart: RRR with normal S1 and S2 and no gallops; no murmurs; no rub  Abdomen: Normoactive bowel sounds, soft, nontender, without masses or organomegaly palpable  Extremities: 1+ pretibial edema. No tremor. Normal strength and motion throughout.  Skin: Normal texture and temperature to palpation. No rash noted.  Neuro: MS is good with appropriate affect, pt is alert and Ox3     DM foot exam: 04/10/18 The skin of the feet is intact without sores or ulcerations. Patient does have a callous formation at the left 1st MTJ The pedal pulses are 2+ on right and 2+ on left. The sensation is absent to a screening 5.07, 10 gram monofilament bilaterally at the toes.           DATA REVIEWED:  Lab Results  Component Value Date   HGBA1C 6.3 (A) 10/20/2018   HGBA1C 5.9 (A) 07/21/2018   HGBA1C 6.2 12/22/2014   Lab  Results  Component Value Date   CREATININE 0.89 07/25/2017  Results for LAZLO, TUNNEY (MRN [id number]) as of 10/20/2018 13:31  Ref. Range 04/10/2018 08:27  PSA Latest Ref Range: 0.10 - 4.00 ng/mL 0.64   Brain MRI (05/26/28) 1. 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 2. 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 3. Non-specific 3 mm RIGHT subependymal nodule.   CT head (09/11/2018)   IMPRESSION: 1. Decreased pneumocephalus. Decreased extra-axial fluid, with decreased mass effect and midline shift. 2. Presumed drainage catheter is unchanged in appearance. 3. CSF density material extends into the extracranial soft tissues of the  right side of the forehead. This is increased compared to the prior study although air which was present in this region on the prior exam has resolved.   ASSESSMENT / PLAN / RECOMMENDATIONS:   1)  Type 2 Diabetes Mellitus, Optimally Controlled with neuropathy - Most recent A1c of 6.3%. Goal A1c < 7.0 %.    -Despite his decreased physical activity he has been avoiding sugar-sweetened beverages and avoid snacking.  - Fasting BG's have been at goal of < 130 mg/dL . We will not initiate any glucose lowering medications at this time.  - Encouraged physical activity and lifestyle changes   EDUCATION / INSTRUCTIONS:  BG monitoring instructions: Patient is instructed to check his blood sugars 2 times a day, fasting and bedtime.  Call [facility] if: BG persistently < 70 or > 300.  I reviewed the Rule of 15 for the treatment of hypoglycemia in detail with the patient. Literature supplied.    2) Diabetic complications:   Eye: Does not have known diabetic retinopathy. Last eye exam was   Neuro/ Feet: Does have known diabetic peripheral neuropathy. Based on exam   Renal: Patient does have known baseline CKD. He is on an ACEI/ARB at present.   3) Lipids: PCP  Started him on cholesterol lowering medication but the  patient has not picked this up yet. He was advised to start at his earliest convenience. We did discuss the benefit in reducing risk of CAD in diabetics.    4) Hypertension: He is at goal of < 140/90 mmHg.   5) Pituitary Microadenoma:   - Pt will have repeat MRI through neurosurgery.  - Clinically and biochemicaly there's no evidence of cortisol,  growth hormone excess or deficiency.  - Historically his TFT's have been normal.    6) Hypogonadotrophic hypogonadism:   - We discussed side effects of testosterone therapy such as erythropoiesis, worsening of sleep apnea that is severe and untreated,  Prostate volumes and serum PSA increase in response to testosterone treatment which might increase BPH and worsen urinary outflow obstruction as well as prostate cancer risk.  - We also discussed there is a possibility of increased cardiovascular risk associated with testosterone use. -Will repeat testosterone levels in 2 weeks, he was advised to come to the lab around 8 AM and fasting.    7) Large arachnoid cyst:   - S/P resection on 08/04/2018 through  Dr. Owens Shark   8) Fatigue :  - This is sudden and worsening. I am concerned this has to do with his OSA. He is going to reach out to Dr. Isidoro Donning. I will check TFT's labs and testosterone again and proceed from there.   F/U in 3 months    Signed electronically by: Mack Guise, MD  [facility] Endocrinology  [facility] [address] [place], [place] [address] Phone: [phone] FAX: [phone]   CC: Kristen Loader, [place] [address] Phone: [phone]  Fax: [phone]  Return to Endocrinology clinic as below: Future Appointments  Date Time Provider [place]  11/03/2018 11:45 AM [facility] LAB [facility] None  01/19/2019  1:00 PM Selyna Klahn, Melanie Crazier, MD [facility] None

## 2018-11-03 ENCOUNTER — Other Ambulatory Visit (INDEPENDENT_AMBULATORY_CARE_PROVIDER_SITE_OTHER): Payer: BLUE CROSS/BLUE SHIELD

## 2018-11-03 ENCOUNTER — Other Ambulatory Visit: Payer: Self-pay

## 2018-11-03 DIAGNOSIS — R5383 Other fatigue: Secondary | ICD-10-CM

## 2018-11-03 DIAGNOSIS — R7989 Other specified abnormal findings of blood chemistry: Secondary | ICD-10-CM

## 2018-11-03 LAB — CBC WITH DIFFERENTIAL/PLATELET
Basophils Absolute: 0.1 10*3/uL (ref 0.0–0.1)
Basophils Relative: 0.6 % (ref 0.0–3.0)
Eosinophils Absolute: 0.1 10*3/uL (ref 0.0–0.7)
Eosinophils Relative: 1.7 % (ref 0.0–5.0)
HCT: 38.4 % — ABNORMAL LOW (ref 39.0–52.0)
Hemoglobin: 13.2 g/dL (ref 13.0–17.0)
Lymphocytes Relative: 29.3 % (ref 12.0–46.0)
Lymphs Abs: 2.6 10*3/uL (ref 0.7–4.0)
MCHC: 34.4 g/dL (ref 30.0–36.0)
MCV: 87.7 fl (ref 78.0–100.0)
Monocytes Absolute: 0.5 10*3/uL (ref 0.1–1.0)
Monocytes Relative: 5.2 % (ref 3.0–12.0)
Neutro Abs: 5.6 10*3/uL (ref 1.4–7.7)
Neutrophils Relative %: 63.2 % (ref 43.0–77.0)
Platelets: 276 10*3/uL (ref 150.0–400.0)
RBC: 4.37 Mil/uL (ref 4.22–5.81)
RDW: 14.2 % (ref 11.5–15.5)
WBC: 8.8 10*3/uL (ref 4.0–10.5)

## 2018-11-03 LAB — TSH: TSH: 2.33 u[IU]/mL (ref 0.35–4.50)

## 2018-11-03 LAB — T4, FREE: Free T4: 0.96 ng/dL (ref 0.60–1.60)

## 2018-11-03 LAB — LUTEINIZING HORMONE: LH: 10.62 m[IU]/mL — ABNORMAL HIGH (ref 1.50–9.30)

## 2018-11-03 LAB — FOLLICLE STIMULATING HORMONE: FSH: 9.4 m[IU]/mL (ref 1.4–18.1)

## 2018-11-05 LAB — TESTOSTERONE, TOTAL, LC/MS/MS: Testosterone, Total, LC-MS-MS: 121 ng/dL — ABNORMAL LOW (ref 250–1100)

## 2018-11-06 ENCOUNTER — Telehealth: Payer: Self-pay | Admitting: Internal Medicine

## 2018-11-06 DIAGNOSIS — R7989 Other specified abnormal findings of blood chemistry: Secondary | ICD-10-CM

## 2018-11-06 NOTE — Telephone Encounter (Addendum)
Discussed lab results with pt and his wife.    Results for Cody Sullivan, Cody Sullivan (MRN [id number]) as of 11/06/2018 14:47  Ref. Range 11/03/2018 08:06  LH Latest Ref Range: 1.50 - 9.30 mIU/mL 10.62 (H)  FSH Latest Ref Range: 1.4 - 18.1 mIU/ML 9.4  Testosterone, Total, LC-MS-MS Latest Ref Range: 250 - 1,100 ng/dL 121 (L)    His testosterone is still low but his LH is high, which is unusual for him as in the past his LH has been inappropriately normal.  We did discuss starting him on testosterone therapy , he understands this is a life long therapy , he also understand with testosterone therapy he will not be able to father any children (pt has no interest in this )  We discussed side effects of testosterone therapy such as erythropoiesis, worsening of sleep apnea that is severe and untreated,  Prostate volumes and serum PSA increase in response to testosterone treatment which might increase BPH and worsen urinary outflow obstruction as well as prostate cancer risk.  - We also discussed there is a possibility of increased cardiovascular risk associated with testosterone use.  - On the other hand, we discussed his LH being elevated is a positive sign that his pituitary is working and responding ,and we could wait 3 more months to see what his testosterone will do then.    Pt chose to hold off on treatment at this time.Will recheck in 3 months. He was advised to contact our lab a week prior to his next appointment.     Abby Nena Jordan, MD  [facility] Endocrinology  [facility] [address] [place], [place] [address] Phone: [phone] FAX: [phone]

## 2018-12-26 ENCOUNTER — Other Ambulatory Visit: Payer: Self-pay | Admitting: Internal Medicine

## 2018-12-26 DIAGNOSIS — E291 Testicular hypofunction: Secondary | ICD-10-CM | POA: Insufficient documentation

## 2019-01-14 ENCOUNTER — Other Ambulatory Visit (INDEPENDENT_AMBULATORY_CARE_PROVIDER_SITE_OTHER): Payer: BC Managed Care – PPO

## 2019-01-14 ENCOUNTER — Other Ambulatory Visit: Payer: Self-pay

## 2019-01-14 DIAGNOSIS — R7989 Other specified abnormal findings of blood chemistry: Secondary | ICD-10-CM | POA: Diagnosis not present

## 2019-01-14 LAB — FOLLICLE STIMULATING HORMONE: FSH: 9.6 m[IU]/mL (ref 1.4–18.1)

## 2019-01-14 LAB — LUTEINIZING HORMONE: LH: 8.84 m[IU]/mL (ref 1.50–9.30)

## 2019-01-17 LAB — TESTOSTERONE, TOTAL, LC/MS/MS: Testosterone, Total, LC-MS-MS: 134 ng/dL — ABNORMAL LOW (ref 250–1100)

## 2019-01-19 ENCOUNTER — Other Ambulatory Visit: Payer: Self-pay

## 2019-01-19 ENCOUNTER — Telehealth: Payer: Self-pay | Admitting: Internal Medicine

## 2019-01-19 ENCOUNTER — Ambulatory Visit (INDEPENDENT_AMBULATORY_CARE_PROVIDER_SITE_OTHER): Payer: BC Managed Care – PPO | Admitting: Internal Medicine

## 2019-01-19 VITALS — BP 110/64 | HR 87 | Temp 98.5°F | Ht 70.0 in | Wt 280.2 lb

## 2019-01-19 DIAGNOSIS — D352 Benign neoplasm of pituitary gland: Secondary | ICD-10-CM

## 2019-01-19 DIAGNOSIS — E119 Type 2 diabetes mellitus without complications: Secondary | ICD-10-CM | POA: Diagnosis not present

## 2019-01-19 DIAGNOSIS — E230 Hypopituitarism: Secondary | ICD-10-CM

## 2019-01-19 LAB — COMPREHENSIVE METABOLIC PANEL
ALT: 29 U/L (ref 0–53)
AST: 18 U/L (ref 0–37)
Albumin: 4.3 g/dL (ref 3.5–5.2)
Alkaline Phosphatase: 64 U/L (ref 39–117)
BUN: 10 mg/dL (ref 6–23)
CO2: 28 mEq/L (ref 19–32)
Calcium: 9.8 mg/dL (ref 8.4–10.5)
Chloride: 100 mEq/L (ref 96–112)
Creatinine, Ser: 0.84 mg/dL (ref 0.40–1.50)
GFR: 99.07 mL/min (ref >60.00)
Glucose, Bld: 79 mg/dL (ref 70–99)
Potassium: 4.3 mEq/L (ref 3.5–5.1)
Sodium: 138 mEq/L (ref 135–145)
Total Bilirubin: 0.6 mg/dL (ref 0.2–1.2)
Total Protein: 7 g/dL (ref 6.0–8.3)

## 2019-01-19 LAB — TESTOSTERONE FREE MS/DIALYSIS
Free Testosterone, Serum: 33 pg/mL — ABNORMAL LOW
Testosterone, Serum (Total): 148 ng/dL — ABNORMAL LOW
Testosterone-% Free: 2.2 %

## 2019-01-19 LAB — CBC
HCT: 40.7 % (ref 39.0–52.0)
Hemoglobin: 13.5 g/dL (ref 13.0–17.0)
MCHC: 33.2 g/dL (ref 30.0–36.0)
MCV: 89.8 fl (ref 78.0–100.0)
Platelets: 289 10*3/uL (ref 150.0–400.0)
RBC: 4.54 Mil/uL (ref 4.22–5.81)
RDW: 13.5 % (ref 11.5–15.5)
WBC: 12.8 10*3/uL — ABNORMAL HIGH (ref 4.0–10.5)

## 2019-01-19 LAB — PSA: PSA: 0.63 ng/mL (ref 0.10–4.00)

## 2019-01-19 LAB — POCT GLYCOSYLATED HEMOGLOBIN (HGB A1C): Hemoglobin A1C: 5.8 % — AB (ref 4.0–5.6)

## 2019-01-19 LAB — GLUCOSE, POCT (MANUAL RESULT ENTRY): POC Glucose: 102 mg/dl — AB (ref 70–99)

## 2019-01-19 MED ORDER — TESTOSTERONE 20.25 MG/ACT (1.62%) TD GEL: 1.0000 | Freq: Every day | TRANSDERMAL | 5 refills | Status: DC

## 2019-01-19 NOTE — Progress Notes (Signed)
Name: Cody Sullivan  Age/ Sex: 43 y.o., male   MRN/ DOB: [id number], Oct 16, 1975     PCP: Kristen Loader, FNP   Reason for Endocrinology Evaluation: Type 2 Diabetes Mellitus  Initial Endocrine Consultative Visit: 04/10/18    PATIENT IDENTIFIER: [name] is a 43 y.o. male with a past medical history of OSA on CPAP, dyslipidemia and obesity . The patient has followed with Endocrinology clinic since 04/10/18 for consultative assistance with management of his diabetes.  DIABETIC HISTORY:  Mr. Sotomayor was diagnosed with T2DM in 2019, her was offered Metformin at the time of his diagnosis by his PCP., but the patient opted for diet control and lifestyle changes. His hemoglobin A1c has ranged from 6.3%  in 10/2017, peaking at 6.5% in 03/2018 .    Low Testosterone:   He was found to have a low testosterone in 03/2018 ( T. Testosterone 126 ng/dL) with inappropriately normal LH/FSH. 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   He is S/P Right frontal craniotomy on 08/04/2018 through Dr. Alfred Levins   He has been tired since 2017, he has noted somewhat decrease in libido, no difficulty with erections. He has noted gynecomastia for a while, no pain or discharge from the nipples. He also noted a subjective decrease in testicular size ~ 01/2018. He has 2 biological kids. He has not noticed any decrease in frequency of shaving.  He has chronic occasional headaches.   No known FH  of prostate cancer.   Testosterone started 01/2019  SUBJECTIVE:    Today (01/20/2019): [name] is here for a 3 month follow up on his diabetes management and hypogonadism.  He is accompanied by his wife today. He checks his blood sugars 1 time daily, preprandial to breakfast. The patient has not had hypoglycemic episodes since the last clinic visit.  He is S/P right frontal craniotomy on 08/04/2018 for arachnoid  cysts. He has noted a new swelling at the surgical site as well as increased headaches. He had spoken to Dr. Owens Shark yesterday.    In term of his low testosterone he denies erectile dysfunction, he does have decreased libido but denies depression and his energy level has improved.   Currently he has his daughter and 55 week old grand son living with them (Cody Sullivan)     ROS: As per HPI and as detailed below: Review of Systems  Constitutional: Negative for weight loss.  Eyes: Negative for blurred vision and pain.  Neurological: Positive for headaches. Negative for dizziness.       Chronic Headaches   Psychiatric/Behavioral: Negative for depression. The patient is not nervous/anxious.       HOME DIABETES REGIMEN:  N/A    METER DOWNLOAD : Did not bring meter  Fasting less then 146 mg/dL    DIABETIC COMPLICATIONS: Microvascular complications:    Denies: Neuropathy, CKD or retinopathy   Last Eye Exam: Completed 06/2018  Macrovascular complications:    Denies: CAD, CVA, PVD   HISTORY:  Past Medical History:  Past Medical History:  Diagnosis Date  . Allergy   . Diabetes mellitus without complication ([place])   . Hypertension   . Kidney stones   . Sleep apnea    cpap   Past Surgical History:  Past Surgical History:  Procedure Laterality Date  . ANKLE SURGERY  2003   right   . cracked chest  2007   equipment fell on chest and cracked his chest, had pain related to injury, cardiologist confirmed not related to heart.    . CRANIOTOMY Right 08/04/2018   Right frontal - cyst removal  . LITHOTRIPSY  2010  . nose fracture  2000    Social History:  reports that he has never smoked. He has never used smokeless tobacco. He reports that he does not drink alcohol or use drugs.  Family History: No family history of prostate ca.    HOME MEDICATIONS: Allergies as of 01/19/2019   No Known Allergies     Medication List       Accurate as of January 19, 2019 11:59 PM. If you  have any questions, ask your nurse or doctor.        Contour Next EZ w/Device Kit 1 Device by Does not apply route 2 (two) times daily.   glucose blood test strip Use as instructed   levETIRAcetam 500 MG tablet Commonly known as: KEPPRA   lisinopril-hydrochlorothiazide 20-25 MG tablet Commonly known as: ZESTORETIC TAKE 1 TABLET BY MOUTH EVERY DAY   Microlet Lancets Misc Use to check blood sugars once a day with Contour Next glucometer   minocycline 50 MG capsule Commonly known as: MINOCIN Take 50 mg by mouth 2 (two) times daily.   pravastatin 20 MG tablet Commonly known as: PRAVACHOL   Testosterone 20.25 MG/ACT (1.62%) Gel Commonly known as: AndroGel Pump Place 1 Pump onto the skin daily. Started by: Dorita Sciara, MD         OBJECTIVE:   Vital Signs: BP 110/64 (BP Location: Left Arm, Patient Position: Sitting, Cuff Size: Large)   Pulse 87   Temp 98.5 F (36.9 C)   Ht '5\' 10"'$  (1.778 m)   Wt 280 lb 3.2 oz (127.1 kg)   SpO2 98%   BMI 40.20 kg/m   Wt Readings from Last 3 Encounters:  01/19/19 280 lb 3.2 oz (127.1 kg)  10/20/18 288 lb 9.6 oz (130.9 kg)  07/21/18 289 lb 6.4 oz (131.3 kg)     Exam: General: Pt appears well and is in NAD  Lungs: Clear with good BS bilat with no rales, rhonchi, or wheezes  Heart: RRR with normal S1 and S2 and no gallops; no murmurs; no rub  Abdomen: Normoactive bowel sounds, soft, nontender, without masses or organomegaly palpable  Extremities: 1+ pretibial edema. No tremor. Normal strength and motion throughout.  Skin: Normal texture and temperature to palpation. No rash noted.  Neuro: MS is good with appropriate affect, pt is alert and Ox3     DM foot exam: 04/10/18 The skin of the feet is intact without sores or ulcerations. Patient does have a callous formation at the left 1st MTJ The pedal pulses are 2+ on right and 2+ on left. The sensation is absent to a screening 5.07, 10 gram monofilament bilaterally at the  toes.           DATA REVIEWED:  Lab Results  Component Value Date   HGBA1C 5.8 (A) 01/19/2019   HGBA1C 6.3 (A) 10/20/2018   HGBA1C 5.9 (A) 07/21/2018   Lab Results  Component Value Date   CREATININE 0.84 01/19/2019    Brain MRI (05/26/28) 1. 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 2. 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 3. Non-specific 3 mm RIGHT subependymal nodule.   CT head (09/11/2018)   IMPRESSION: 1. Decreased pneumocephalus. Decreased extra-axial fluid, with decreased mass effect and midline  shift. 2. Presumed drainage catheter is unchanged in appearance. 3. CSF density material extends into the extracranial soft tissues of the right side of the forehead. This is increased compared to the prior study although air which was present in this region on the prior exam has resolved.  Results for TAHJE, BORAWSKI (MRN [id number]) as of 01/20/2019 09:09  Ref. Range 01/19/2019 14:02  Sodium Latest Ref Range: 135 - 145 mEq/L 138  Potassium Latest Ref Range: 3.5 - 5.1 mEq/L 4.3  Chloride Latest Ref Range: 96 - 112 mEq/L 100  CO2 Latest Ref Range: 19 - 32 mEq/L 28  Glucose Latest Ref Range: 70 - 99 mg/dL 79  BUN Latest Ref Range: 6 - 23 mg/dL 10  Creatinine Latest Ref Range: 0.40 - 1.50 mg/dL 0.84  Calcium Latest Ref Range: 8.4 - 10.5 mg/dL 9.8  Alkaline Phosphatase Latest Ref Range: 39 - 117 U/L 64  Albumin Latest Ref Range: 3.5 - 5.2 g/dL 4.3  AST Latest Ref Range: 0 - 37 U/L 18  ALT Latest Ref Range: 0 - 53 U/L 29  Total Protein Latest Ref Range: 6.0 - 8.3 g/dL 7.0  Total Bilirubin Latest Ref Range: 0.2 - 1.2 mg/dL 0.6  GFR Latest Ref Range: >60.00 mL/min 99.07  WBC Latest Ref Range: 4.0 - 10.5 K/uL 12.8 (H)  RBC Latest Ref Range: 4.22 - 5.81 Mil/uL 4.54  Hemoglobin Latest Ref Range: 13.0 - 17.0 g/dL 13.5  HCT Latest Ref Range: 39.0 - 52.0 % 40.7  MCV Latest Ref Range: 78.0 - 100.0 fl 89.8  MCHC Latest Ref Range: 30.0 - 36.0 g/dL 33.2  RDW  Latest Ref Range: 11.5 - 15.5 % 13.5  Platelets Latest Ref Range: 150.0 - 400.0 K/uL 289.0  PSA Latest Ref Range: 0.10 - 4.00 ng/mL 0.63  Results for UNDRA, HARRIMAN (MRN [id number]) as of 01/20/2019 09:09  Ref. Range 01/14/2019 08:58  LH Latest Ref Range: 1.50 - 9.30 mIU/mL 8.84  FSH Latest Ref Range: 1.4 - 18.1 mIU/ML 9.6  Testosterone, Total, LC-MS-MS Latest Ref Range: 250 - 1,100 ng/dL 134 (L)  Testosterone-% Free Latest Units: % 2.2  Testosterone, Serum (Total) Latest Units: ng/dL 148 (L)  Free Testosterone, Serum Latest Units: pg/mL 33 (L)    ASSESSMENT / PLAN / RECOMMENDATIONS:   1)  Type 2 Diabetes Mellitus, Optimally Controlled with neuropathy - Most recent A1c of 5.8%. Goal A1c < 7.0 %.    - Praised the patient on glucose control with lifestyle changes.   EDUCATION / INSTRUCTIONS:  BG monitoring instructions: Patient is instructed to check his blood sugars 2 times a day, fasting and bedtime.  Call [facility] if: BG persistently < 70 or > 300. . I reviewed the Rule of 15 for the treatment of hypoglycemia in detail with the patient. Literature supplied.    2) Diabetic complications:   Eye: Does not have known diabetic retinopathy. Last eye exam was   Neuro/ Feet: Does have known diabetic peripheral neuropathy. Based on exam 2019  Renal: Patient does have known baseline CKD. He is on an ACEI/ARB at present.     3) Pituitary Microadenoma:   - Pt will have repeat MRI through neurosurgery.  - Clinically and biochemicaly there's no evidence of cortisol,  growth hormone excess or deficiency.  - TFT's have been normal.    4) Hypogonadotrophic hypogonadism:    - Testosterone continues to be low , will move forward with testosterone replacement therapy.   - We discussed side effects  of testosterone therapy such as erythropoiesis, worsening of sleep apnea that is severe and untreated,  Prostate volumes and serum PSA increase in response to  testosterone treatment which might increase BPH and worsen urinary outflow obstruction as well as prostate cancer risk.  - We also discussed there is a possibility of increased cardiovascular risk associated with testosterone use. - On today's labs his Hg/Hct , PSA and LFT's are all normal.  -Will repeat testosterone levels in 3 months.    5) Large arachnoid cyst:   - S/P resection on 08/04/2018 through  Dr. Owens Shark   F/U in 3 months    Signed electronically by: Mack Guise, MD  [facility] Endocrinology  [facility] [address] [place], [place] [address] Phone: [phone] FAX: [phone]   CC: Kristen Loader, [place] [address] Phone: [phone]  Fax: [phone]  Return to Endocrinology clinic as below: Future Appointments  Date Time Provider [place]  04/21/2019  3:00 PM Jairus Tonne, Melanie Crazier, MD [facility] None

## 2019-01-19 NOTE — Telephone Encounter (Signed)
Patient called and advised that the Testosterone (androgel Pump) needs a prior authorization.  The number to contact to start the PA is [phone]

## 2019-01-19 NOTE — Patient Instructions (Signed)
-   Please apply one pump of testosterone gel to your shoulder, once daily

## 2019-01-20 ENCOUNTER — Encounter: Payer: Self-pay | Admitting: Internal Medicine

## 2019-01-20 ENCOUNTER — Emergency Department (HOSPITAL_BASED_OUTPATIENT_CLINIC_OR_DEPARTMENT_OTHER): Payer: BC Managed Care – PPO

## 2019-01-20 ENCOUNTER — Encounter (HOSPITAL_BASED_OUTPATIENT_CLINIC_OR_DEPARTMENT_OTHER): Payer: Self-pay

## 2019-01-20 ENCOUNTER — Other Ambulatory Visit: Payer: Self-pay

## 2019-01-20 ENCOUNTER — Emergency Department (HOSPITAL_BASED_OUTPATIENT_CLINIC_OR_DEPARTMENT_OTHER)
Admission: EM | Admit: 2019-01-20 | Discharge: 2019-01-20 | Disposition: A | Discharge status: Home / Self Care | Payer: BC Managed Care – PPO | Attending: Emergency Medicine | Admitting: Emergency Medicine

## 2019-01-20 DIAGNOSIS — I10 Essential (primary) hypertension: Secondary | ICD-10-CM | POA: Diagnosis not present

## 2019-01-20 DIAGNOSIS — K610 Anal abscess: Secondary | ICD-10-CM | POA: Insufficient documentation

## 2019-01-20 DIAGNOSIS — K6289 Other specified diseases of anus and rectum: Secondary | ICD-10-CM | POA: Diagnosis present

## 2019-01-20 DIAGNOSIS — Z79899 Other long term (current) drug therapy: Secondary | ICD-10-CM | POA: Diagnosis not present

## 2019-01-20 DIAGNOSIS — E119 Type 2 diabetes mellitus without complications: Secondary | ICD-10-CM | POA: Insufficient documentation

## 2019-01-20 DIAGNOSIS — E230 Hypopituitarism: Secondary | ICD-10-CM | POA: Insufficient documentation

## 2019-01-20 LAB — CBC WITH DIFFERENTIAL/PLATELET
Abs Immature Granulocytes: 0.05 10*3/uL (ref 0.00–0.07)
Basophils Absolute: 0 10*3/uL (ref 0.0–0.1)
Basophils Relative: 0 %
Eosinophils Absolute: 0 10*3/uL (ref 0.0–0.5)
Eosinophils Relative: 0 %
HCT: 41.5 % (ref 39.0–52.0)
Hemoglobin: 13.1 g/dL (ref 13.0–17.0)
Immature Granulocytes: 0 %
Lymphocytes Relative: 20 %
Lymphs Abs: 3 10*3/uL (ref 0.7–4.0)
MCH: 29.1 pg (ref 26.0–34.0)
MCHC: 31.6 g/dL (ref 30.0–36.0)
MCV: 92.2 fL (ref 80.0–100.0)
Monocytes Absolute: 1.1 10*3/uL — ABNORMAL HIGH (ref 0.1–1.0)
Monocytes Relative: 8 %
Neutro Abs: 10.8 10*3/uL — ABNORMAL HIGH (ref 1.7–7.7)
Neutrophils Relative %: 72 %
Platelets: 284 10*3/uL (ref 150–400)
RBC: 4.5 MIL/uL (ref 4.22–5.81)
RDW: 13.1 % (ref 11.5–15.5)
WBC: 15 10*3/uL — ABNORMAL HIGH (ref 4.0–10.5)
nRBC: 0 % (ref 0.0–0.2)

## 2019-01-20 LAB — COMPREHENSIVE METABOLIC PANEL
ALT: 28 U/L (ref 0–44)
AST: 21 U/L (ref 15–41)
Albumin: 4.1 g/dL (ref 3.5–5.0)
Alkaline Phosphatase: 61 U/L (ref 38–126)
Anion gap: 14 (ref 5–15)
BUN: 14 mg/dL (ref 6–20)
CO2: 23 mmol/L (ref 22–32)
Calcium: 9.5 mg/dL (ref 8.9–10.3)
Chloride: 99 mmol/L (ref 98–111)
Creatinine, Ser: 0.95 mg/dL (ref 0.61–1.24)
GFR calc Af Amer: >60 mL/min (ref >60)
GFR calc non Af Amer: >60 mL/min (ref >60)
Glucose, Bld: 111 mg/dL — ABNORMAL HIGH (ref 70–99)
Potassium: 3.6 mmol/L (ref 3.5–5.1)
Sodium: 136 mmol/L (ref 135–145)
Total Bilirubin: 0.8 mg/dL (ref 0.3–1.2)
Total Protein: 7.6 g/dL (ref 6.5–8.1)

## 2019-01-20 MED ORDER — AMOXICILLIN-POT CLAVULANATE 875-125 MG PO TABS: 1.0000 | ORAL_TABLET | Freq: Two times a day (BID) | ORAL | 0 refills | Status: DC

## 2019-01-20 MED ORDER — SODIUM CHLORIDE 0.9 % IV BOLUS
1000.0000 mL | Freq: Once | INTRAVENOUS | Status: AC
  Administered 2019-01-20: 18:00:00 1000 mL via INTRAVENOUS

## 2019-01-20 MED ORDER — FENTANYL CITRATE (PF) 100 MCG/2ML IJ SOLN
50.0000 ug | Freq: Once | INTRAMUSCULAR | Status: AC
  Administered 2019-01-20: 18:00:00 50 ug via INTRAVENOUS
  Filled 2019-01-20: qty 2

## 2019-01-20 MED ORDER — IOHEXOL 300 MG/ML  SOLN
100.0000 mL | Freq: Once | INTRAMUSCULAR | Status: AC | PRN
  Administered 2019-01-20: 20:00:00 100 mL via INTRAVENOUS

## 2019-01-20 MED ORDER — OXYCODONE-ACETAMINOPHEN 5-325 MG PO TABS
1.0000 | ORAL_TABLET | Freq: Once | ORAL | Status: AC
  Administered 2019-01-20: 21:00:00 1 via ORAL
  Filled 2019-01-20: qty 1

## 2019-01-20 MED ORDER — HYDROCODONE-ACETAMINOPHEN 5-325 MG PO TABS: 1.0000 | ORAL_TABLET | Freq: Four times a day (QID) | ORAL | 0 refills | Status: DC | PRN

## 2019-01-20 MED ORDER — PIPERACILLIN-TAZOBACTAM 3.375 G IVPB 30 MIN
3.3750 g | Freq: Once | INTRAVENOUS | Status: AC
  Administered 2019-01-20: 21:00:00 3.375 g via INTRAVENOUS
  Filled 2019-01-20 (×2): qty 50

## 2019-01-20 NOTE — ED Notes (Signed)
Pt unable to sit still, obviously uncomfortable. Asking for pain meds. Primary RN made aware, will notify EDP.

## 2019-01-20 NOTE — Discharge Instructions (Addendum)
Do a sitz bath twice a day. Take a stool softener if you're taking a pain medication. Take your antibiotics as directed. Get rechecked immediately if you develop high fevers or worsening symptoms. Call to schedule a follow-up appointment with the surgeon.

## 2019-01-20 NOTE — ED Notes (Signed)
Pt to CT

## 2019-01-20 NOTE — Telephone Encounter (Signed)
[employer] PA but the member ID # in pt chart comes up as not having an account with [employer]. Called pt and lft vm for correct info on insurance card.

## 2019-01-20 NOTE — ED Notes (Signed)
Spoke to RN from [facility] [facility]- PCP concerned for perirectal abscess. Unable to do rectal exam at [facility] b

## 2019-01-20 NOTE — ED Notes (Signed)
ED Provider at bedside. 

## 2019-01-20 NOTE — ED Triage Notes (Signed)
Pt c/o rectal pain x 4-5 days-denies injury/bleeding-states he was sent from [facility] PCP-NAD-slow gait

## 2019-01-20 NOTE — ED Provider Notes (Signed)
[facility] [facility] EMERGENCY DEPARTMENT Provider Note   CSN: [id number] Arrival date & time: 01/20/19  1629    History   Chief Complaint Chief Complaint  Patient presents with  . Rectal Pain    HPI MATEJ SAPPENFIELD is a 43 y.o. male.     The history is provided by the patient and medical records. No language interpreter was used.   DUNG SALINGER is a 43 y.o. male who presents to the Emergency Department complaining of rectal pain. He presents to the emergency department complaining of rectal pain, greatest on the right side that began about 4 to 5 days ago. Pain is constant and throbbing in nature. He has associated burning when he defecated. He denies any fevers, nausea, vomiting, abdominal pain, dysuria. No prior similar symptoms. He thought that he might have a hemorrhoid. Today the pain became severe and he went to the [facility] walk-in [facility]. He was evaluated there and referred to the emergency department for further evaluation. Pain is worse when he walks or sits. He does have a history of diabetes. No prior similar symptoms. No known coronavirus exposures. Past Medical History:  Diagnosis Date  . Allergy   . Diabetes mellitus without complication ([place])   . Hypertension   . Kidney stones   . Sleep apnea    cpap    Patient Active Problem List   Diagnosis Date Noted  . Hypogonadotropic hypogonadism ([place]) 01/20/2019  . Hypogonadism in male 12/26/2018  . Type 2 diabetes mellitus without complication, without long-term current use of insulin ([place]) 07/22/2018  . Low testosterone 07/22/2018  . Pituitary microadenoma ([place]) 07/22/2018  . 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 Essential hypertension 06/04/2018  . HTN, goal below 130/80 02/25/2013  . Hyperlipidemia 02/25/2013  . Poison ivy dermatitis 11/19/2012  . Obesity, unspecified 03/26/2012  . Allergic rhinitis 10/19/2009    Past Surgical History:  Procedure Laterality Date  . ANKLE SURGERY  2003   right   . cracked  chest  2007   equipment fell on chest and cracked his chest, had pain related to injury, cardiologist confirmed not related to heart.    . CRANIOTOMY Right 08/04/2018   Right frontal - cyst removal  . LITHOTRIPSY  2010  . nose fracture  2000        Home Medications    Prior to Admission medications   Medication Sig Start Date End Date Taking? Authorizing Provider  amoxicillin-clavulanate (AUGMENTIN) 875-125 MG tablet Take 1 tablet by mouth every 12 (twelve) hours. 01/20/19   Quintella Reichert, MD  Blood Glucose Monitoring Suppl (CONTOUR NEXT EZ) w/Device KIT 1 Device by Does not apply route 2 (two) times daily. 04/10/18   Shamleffer, Melanie Crazier, MD  glucose blood test strip Use as instructed 04/10/18   Shamleffer, Melanie Crazier, MD  HYDROcodone-acetaminophen (NORCO/VICODIN) 5-325 MG tablet Take 1 tablet by mouth every 6 (six) hours as needed. 01/20/19   Quintella Reichert, MD  levETIRAcetam (KEPPRA) 500 MG tablet  09/30/18   [provider]  lisinopril-hydrochlorothiazide (PRINZIDE,ZESTORETIC) 20-25 MG tablet TAKE 1 TABLET BY MOUTH EVERY DAY 08/14/18   Richardo Priest, MD  MICROLET LANCETS MISC Use to check blood sugars once a day with Contour Next glucometer 04/10/18   Shamleffer, Melanie Crazier, MD  minocycline (MINOCIN,DYNACIN) 50 MG capsule Take 50 mg by mouth 2 (two) times daily.    [provider]  pravastatin (PRAVACHOL) 20 MG tablet  01/01/19   [provider]  Testosterone (ANDROGEL PUMP) 20.25  MG/ACT (1.62%) GEL Place 1 Pump onto the skin daily. 01/19/19   Shamleffer, Melanie Crazier, MD    Family History No family history on file.  Social History Social History   Tobacco Use  . Smoking status: Never Smoker  . Smokeless tobacco: Never Used  Substance Use Topics  . Alcohol use: No    Alcohol/week: 0.0 standard drinks  . Drug use: No     Allergies   Patient has no known allergies.   Review of Systems Review of Systems  All other systems  reviewed and are negative.    Physical Exam Updated Vital Signs BP 98/64 (BP Location: Right Arm)   Pulse 88   Temp 98.8 F (37.1 C) (Oral)   Resp 16   Ht '5\' 10"'$  (1.778 m)   Wt 126.6 kg   SpO2 100%   BMI 40.03 kg/m   Physical Exam Vitals signs and nursing note reviewed.  Constitutional:      Appearance: He is well-developed.  HENT:     Head: Normocephalic and atraumatic.  Cardiovascular:     Rate and Rhythm: Regular rhythm. Tachycardia present.     Heart sounds: No murmur.  Pulmonary:     Effort: Pulmonary effort is normal. No respiratory distress.     Breath sounds: Normal breath sounds.  Abdominal:     Palpations: Abdomen is soft.     Tenderness: There is no abdominal tenderness. There is no guarding or rebound.  Genitourinary:    Comments: Significant rectal tenderness. There is no overt external perirectal erythema, edema, or hemorrhoid. There is right sided rectal fullness with tenderness. Musculoskeletal:        General: No swelling or tenderness.  Skin:    General: Skin is warm and dry.  Neurological:     Mental Status: He is alert and oriented to person, place, and time.  Psychiatric:        Mood and Affect: Mood normal.        Behavior: Behavior normal.      ED Treatments / Results  Labs (all labs ordered are listed, but only abnormal results are displayed) Labs Reviewed  COMPREHENSIVE METABOLIC PANEL - Abnormal; Notable for the following components:      Result Value   Glucose, Bld 111 (*)    All other components within normal limits  CBC WITH DIFFERENTIAL/PLATELET - Abnormal; Notable for the following components:   WBC 15.0 (*)    Neutro Abs 10.8 (*)    Monocytes Absolute 1.1 (*)    All other components within normal limits    EKG None  Radiology Ct Abdomen Pelvis W Contrast  Result Date: 01/20/2019 CLINICAL DATA:  Rectal pain EXAM: CT ABDOMEN AND PELVIS WITH CONTRAST TECHNIQUE: Multidetector CT imaging of the abdomen and pelvis was  performed using the standard protocol following bolus administration of intravenous contrast. CONTRAST:  137m OMNIPAQUE IOHEXOL 300 MG/ML  SOLN COMPARISON:  CT 04/28/2018 FINDINGS: Lower chest: No acute abnormality. Hepatobiliary: Hepatic steatosis with fat sparing at the gallbladder fossa. No calcified gallstone. No biliary dilatation. Pancreas: Unremarkable. No pancreatic ductal dilatation or surrounding inflammatory changes. Spleen: Normal in size without focal abnormality. Adrenals/Urinary Tract: Adrenal glands are normal. No hydronephrosis. Punctate stones in the upper and lower pole of the left kidney. The bladder is thick walled but nearly empty. Stomach/Bowel: Stomach is within normal limits. Appendix appears normal. No evidence of bowel wall thickening, distention, or inflammatory changes. Sigmoid colon diverticular disease without acute inflammatory process. Small rim enhancing fluid  collection at the midline posterior perianal region measuring 12 mm AP by 11 mm transverse. Vascular/Lymphatic: Nonaneurysmal aorta. No significantly enlarged lymph nodes. Reproductive: Prostate is unremarkable. Other: Negative for free air or free fluid. Musculoskeletal: No acute or suspicious osseous abnormality IMPRESSION: 1. Tiny 12 x 11 mm rim enhancing fluid collection at the midline posterior perianal region consistent with small abscess. 2. Hepatic steatosis 3. Left kidney stones 4. Sigmoid colon diverticular disease without acute inflammatory process Electronically Signed   By: Donavan Foil M.D.   On: 01/20/2019 19:37    Procedures Procedures (including critical care time)  Medications Ordered in ED Medications  fentaNYL (SUBLIMAZE) injection 50 mcg (50 mcg Intravenous Given 01/20/19 1757)  sodium chloride 0.9 % bolus 1,000 mL ( Intravenous Stopped 01/20/19 1904)  iohexol (OMNIPAQUE) 300 MG/ML solution 100 mL (100 mLs Intravenous Contrast Given 01/20/19 1931)  piperacillin-tazobactam (ZOSYN) IVPB 3.375 g (  Intravenous Stopped 01/20/19 2141)  oxyCODONE-acetaminophen (PERCOCET/ROXICET) 5-325 MG per tablet 1 tablet (1 tablet Oral Given 01/20/19 2121)     Initial Impression / Assessment and Plan / ED Course  I have reviewed the triage vital signs and the nursing notes.  Pertinent labs & imaging results that were available during my care of the patient were reviewed by me and considered in my medical decision making (see chart for details).        Patient with history of diabetes here for evaluation of rectal pain. He has no external evidence of abscess but he does have rectal tenderness and fullness on examination. CT scan demonstrates a small peri rectal abscess. Labs are significant for leukocytosis. The patient is non-toxic appearing on evaluation. He does report that he has had some rectal drainage, question possible spontaneous drainage of the abscess. Discussed with Dr. Elenor Legato with general surgery the findings of the CT scan. Plan to initiate antibiotics and give a dose of IV antibiotics in the emergency department. Counseled patient on home care as well as close outpatient follow-up and return precautions.  Final Clinical Impressions(s) / ED Diagnoses   Final diagnoses:  Perianal abscess    ED Discharge Orders         Ordered    amoxicillin-clavulanate (AUGMENTIN) 875-125 MG tablet  Every 12 hours     01/20/19 2103    HYDROcodone-acetaminophen (NORCO/VICODIN) 5-325 MG tablet  Every 6 hours PRN     01/20/19 2105           Quintella Reichert, MD 01/21/19 0021

## 2019-01-27 ENCOUNTER — Telehealth: Payer: Self-pay | Admitting: Internal Medicine

## 2019-01-27 NOTE — Telephone Encounter (Signed)
Patient's wife Claiborne Billings called re: Androgel. Androgel is not covered by patient's insurance. Claiborne Billings requests Dr. Kelton Pillar call  preauthorization ph# [phone] so that insurance will cover the Androgel.

## 2019-01-28 NOTE — Telephone Encounter (Signed)
Per message on 01/19/2019 [employer] states that pt is not active. I can not complete a PA if pt is not an active member. Lft 2 vm for pt to contact insurance and then call office back with correct information

## 2019-01-28 NOTE — Telephone Encounter (Signed)
I believe this was routed erroneously

## 2019-01-29 ENCOUNTER — Telehealth: Payer: Self-pay | Admitting: Internal Medicine

## 2019-01-29 NOTE — Telephone Encounter (Signed)
PA was initiated and was sent for further review. Office will be contacted in 24-48 hours with determination

## 2019-01-29 NOTE — Telephone Encounter (Signed)
Izora Gala with [place] ph# [phone] called re: status of a PA for Androgel. Please call [facility] [employer] at the above ph# (may speak with whoever answers the call) to give them PA and information. Patient has been calling the above because he is trying to get the RX filled.

## 2019-01-29 NOTE — Telephone Encounter (Signed)
Patient's wife Claiborne Billings requests that Charisse March call Claiborne Billings or patient ph# [phone] re: status of PA for Androgel

## 2019-01-30 NOTE — Telephone Encounter (Signed)
Approval # [id number] [place] for androgel  From 01/29/2019 until 01/29/2020 pt aware

## 2019-01-30 NOTE — Telephone Encounter (Signed)
Lft vm for pt wife Claiborne Billings informing her of status of PA per phone note for 01/29/19

## 2019-04-15 ENCOUNTER — Other Ambulatory Visit: Payer: Self-pay | Admitting: Endocrinology

## 2019-04-15 ENCOUNTER — Other Ambulatory Visit: Payer: Self-pay | Admitting: Internal Medicine

## 2019-04-15 ENCOUNTER — Other Ambulatory Visit (INDEPENDENT_AMBULATORY_CARE_PROVIDER_SITE_OTHER): Payer: BC Managed Care – PPO

## 2019-04-15 ENCOUNTER — Other Ambulatory Visit: Payer: Self-pay

## 2019-04-15 DIAGNOSIS — E291 Testicular hypofunction: Secondary | ICD-10-CM

## 2019-04-15 DIAGNOSIS — E119 Type 2 diabetes mellitus without complications: Secondary | ICD-10-CM

## 2019-04-15 DIAGNOSIS — R7989 Other specified abnormal findings of blood chemistry: Secondary | ICD-10-CM

## 2019-04-15 DIAGNOSIS — E349 Endocrine disorder, unspecified: Secondary | ICD-10-CM

## 2019-04-15 LAB — CBC WITH DIFFERENTIAL/PLATELET
Basophils Absolute: 0 10*3/uL (ref 0.0–0.1)
Basophils Relative: 0.4 % (ref 0.0–3.0)
Eosinophils Absolute: 0.1 10*3/uL (ref 0.0–0.7)
Eosinophils Relative: 1.4 % (ref 0.0–5.0)
HCT: 41 % (ref 39.0–52.0)
Hemoglobin: 13.8 g/dL (ref 13.0–17.0)
Lymphocytes Relative: 30 % (ref 12.0–46.0)
Lymphs Abs: 2.7 10*3/uL (ref 0.7–4.0)
MCHC: 33.7 g/dL (ref 30.0–36.0)
MCV: 87.5 fl (ref 78.0–100.0)
Monocytes Absolute: 0.4 10*3/uL (ref 0.1–1.0)
Monocytes Relative: 4.5 % (ref 3.0–12.0)
Neutro Abs: 5.7 10*3/uL (ref 1.4–7.7)
Neutrophils Relative %: 63.7 % (ref 43.0–77.0)
Platelets: 256 10*3/uL (ref 150.0–400.0)
RBC: 4.69 Mil/uL (ref 4.22–5.81)
RDW: 13.9 % (ref 11.5–15.5)
WBC: 8.9 10*3/uL (ref 4.0–10.5)

## 2019-04-15 LAB — HEMOGLOBIN A1C: Hgb A1c MFr Bld: 6.4 % (ref 4.6–6.5)

## 2019-04-17 ENCOUNTER — Other Ambulatory Visit: Payer: Self-pay

## 2019-04-17 LAB — TSH: TSH: 1.33 u[IU]/mL (ref 0.35–4.50)

## 2019-04-17 LAB — T4, FREE: Free T4: 0.93 ng/dL (ref 0.60–1.60)

## 2019-04-17 LAB — PSA: PSA: 0.58 ng/mL (ref 0.10–4.00)

## 2019-04-18 LAB — TESTOSTERONE, TOTAL, LC/MS/MS: Testosterone, Total, LC-MS-MS: 162 ng/dL — ABNORMAL LOW (ref 250–1100)

## 2019-04-21 ENCOUNTER — Other Ambulatory Visit: Payer: Self-pay

## 2019-04-21 ENCOUNTER — Ambulatory Visit (INDEPENDENT_AMBULATORY_CARE_PROVIDER_SITE_OTHER): Payer: BC Managed Care – PPO | Admitting: Internal Medicine

## 2019-04-21 ENCOUNTER — Encounter: Payer: Self-pay | Admitting: Internal Medicine

## 2019-04-21 VITALS — BP 120/60 | HR 86 | Ht 70.0 in | Wt 281.8 lb

## 2019-04-21 DIAGNOSIS — E291 Testicular hypofunction: Secondary | ICD-10-CM | POA: Diagnosis not present

## 2019-04-21 DIAGNOSIS — R7989 Other specified abnormal findings of blood chemistry: Secondary | ICD-10-CM

## 2019-04-21 DIAGNOSIS — E1142 Type 2 diabetes mellitus with diabetic polyneuropathy: Secondary | ICD-10-CM | POA: Insufficient documentation

## 2019-04-21 MED ORDER — TESTOSTERONE 20.25 MG/ACT (1.62%) TD GEL: 3.0000 | Freq: Every day | TRANSDERMAL | 5 refills | Status: DC

## 2019-04-21 NOTE — Progress Notes (Signed)
Name: Cody Sullivan  Age/ Sex: 43 y.o., male   MRN/ DOB: [id number], Oct 31, 1975     PCP: Kristen Loader, FNP   Reason for Endocrinology Evaluation: Type 2 Diabetes Mellitus  Initial Endocrine Consultative Visit: 04/10/18    PATIENT IDENTIFIER: [name] is a 43 y.o. male with a past medical history of OSA on CPAP, dyslipidemia and obesity . The patient has followed with Endocrinology clinic since 04/10/18 for consultative assistance with management of his diabetes.  DIABETIC HISTORY:  Mr. Purnell was diagnosed with T2DM in 2019, her was offered Metformin at the time of his diagnosis by his PCP., but the patient opted for diet control and lifestyle changes. His hemoglobin A1c has ranged from 6.3%  in 10/2017, peaking at 6.5% in 03/2018 .   Low Testosterone:   He was found to have a low testosterone in 03/2018 ( T. Testosterone 126 ng/dL) with inappropriately normal LH/FSH. 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   He is S/P Right frontal craniotomy on 08/04/2018 through Dr. Alfred Levins   He has been tired since 2017, he has noted somewhat decrease in libido, no difficulty with erections. He has noted gynecomastia for a while, no pain or discharge from the nipples. He also noted a subjective decrease in testicular size ~ 01/2018. He has 2 biological kids. He has not noticed any decrease in frequency of shaving.  He has chronic occasional headaches.   No known FH  of prostate cancer.   Testosterone started 01/2019  SUBJECTIVE:    Today (04/22/2019): [name] is here for a 3 month follow up on his diabetes management and hypogonadism. He checks his blood sugars 1 time daily, preprandial to breakfast. The patient has not had hypoglycemic episodes since the last clinic visit.  He is S/P right frontal craniotomy on 08/04/2018 for arachnoid cysts. Continues to have a lo  In term  of his low testosterone he has no side effects to the androgel but he also has not noted any changes in body habitus nor energy. He never had issues with ED.   Currently he has his daughter and grand son living with them ([place]) 4 months     ROS: As per HPI and as detailed below: Review of Systems  Constitutional: Negative for weight loss.  Gastrointestinal: Negative for diarrhea and nausea.  Genitourinary: Negative for frequency.  Neurological: Positive for headaches. Negative for dizziness.       Chronic Headaches   Endo/Heme/Allergies: Negative for polydipsia.  Psychiatric/Behavioral: Negative for depression. The patient is not nervous/anxious.       HOME ENDOCRINE REGIMEN:  Androgel 20.25 gram to a  shoulder      METER DOWNLOAD SUMMARY: 9/29-10/282020 Fingerstick Blood Glucose Tests = 29 Average Number Tests/Day = 1.0 Overall Mean FS Glucose = 131 Standard Deviation = 7  BG Ranges: Low = 120 High = 148   Hypoglycemic Events/30 Days: BG < 50 = 0 Episodes of symptomatic severe hypoglycemia = 0      DIABETIC COMPLICATIONS: Microvascular complications:    Denies: Neuropathy, CKD or retinopathy   Last Eye Exam: Completed 06/2018  Macrovascular complications:    Denies: CAD, CVA, PVD   HISTORY:  Past Medical History:  Past Medical History:  Diagnosis Date  . Allergy   . Diabetes mellitus without complication ([place])   . Hypertension   . Kidney stones   .  Sleep apnea    cpap   Past Surgical History:  Past Surgical History:  Procedure Laterality Date  . ANKLE SURGERY  2003   right   . cracked chest  2007   equipment fell on chest and cracked his chest, had pain related to injury, cardiologist confirmed not related to heart.    . CRANIOTOMY Right 08/04/2018   Right frontal - cyst removal  . LITHOTRIPSY  2010  . nose fracture  2000    Social History:  reports that he has never smoked. He has never used smokeless tobacco. He reports that he does  not drink alcohol or use drugs.  Family History: No family history of prostate ca.    HOME MEDICATIONS: Allergies as of 04/21/2019   No Known Allergies     Medication List       Accurate as of April 21, 2019 11:59 PM. If you have any questions, ask your nurse or doctor.        STOP taking these medications   amoxicillin-clavulanate 875-125 MG tablet Commonly known as: AUGMENTIN Stopped by: Dorita Sciara, MD   HYDROcodone-acetaminophen 5-325 MG tablet Commonly known as: NORCO/VICODIN Stopped by: Dorita Sciara, MD   levETIRAcetam 500 MG tablet Commonly known as: KEPPRA Stopped by: Dorita Sciara, MD     TAKE these medications   Contour Next EZ w/Device Kit 1 Device by Does not apply route 2 (two) times daily.   glucose blood test strip Use as instructed   lisinopril-hydrochlorothiazide 20-25 MG tablet Commonly known as: ZESTORETIC TAKE 1 TABLET BY MOUTH EVERY DAY   Microlet Lancets Misc Use to check blood sugars once a day with Contour Next glucometer   minocycline 50 MG capsule Commonly known as: MINOCIN Take 50 mg by mouth 2 (two) times daily.   pravastatin 20 MG tablet Commonly known as: PRAVACHOL   Testosterone 20.25 MG/ACT (1.62%) Gel Commonly known as: AndroGel Pump Place 3 Pump onto the skin daily. What changed: how much to take Changed by: Dorita Sciara, MD         OBJECTIVE:   Vital Signs: BP 120/60 (BP Location: Left Arm, Patient Position: Sitting, Cuff Size: Large)   Pulse 86   Ht '5\' 10"'$  (1.778 m)   Wt 281 lb 12.8 oz (127.8 kg)   SpO2 98%   BMI 40.43 kg/m   Wt Readings from Last 3 Encounters:  04/21/19 281 lb 12.8 oz (127.8 kg)  01/20/19 279 lb (126.6 kg)  01/19/19 280 lb 3.2 oz (127.1 kg)     Exam: General: Pt appears well and is in NAD  Lungs: Clear with good BS bilat with no rales, rhonchi, or wheezes  Heart: RRR with normal S1 and S2 and no gallops; no murmurs; no rub  Abdomen: Normoactive bowel  sounds, soft, nontender, without masses or organomegaly palpable  Extremities: 1+ pretibial edema. No tremor. Normal strength and motion throughout.  Skin: Normal texture and temperature to palpation. No rash noted.  Neuro: MS is good with appropriate affect, pt is alert and Ox3    DM foot exam: 04/21/2019 The skin of the feet is intact without sores or ulcerations.  The pedal pulses are 2+ on right and 2+ on left. The sensation is absent to a screening 5.07, 10 gram monofilament bilaterally at the toes.           DATA REVIEWED:  Lab Results  Component Value Date   HGBA1C 6.4 04/15/2019   HGBA1C 5.8 (A) 01/19/2019  HGBA1C 6.3 (A) 10/20/2018   Lab Results  Component Value Date   CREATININE 0.95 01/20/2019    CT head (09/11/2018)   IMPRESSION: 1. Decreased pneumocephalus. Decreased extra-axial fluid, with decreased mass effect and midline shift. 2. Presumed drainage catheter is unchanged in appearance. 3. CSF density material extends into the extracranial soft tissues of the right side of the forehead. This is increased compared to the prior study although air which was present in this region on the prior exam has resolved.  Results for HONDO, NANDA (MRN [id number]) as of 04/22/2019 08:40  Ref. Range 04/15/2019 09:44 04/15/2019 14:16  WBC Latest Ref Range: 4.0 - 10.5 K/uL 8.9   RBC Latest Ref Range: 4.22 - 5.81 Mil/uL 4.69   Hemoglobin Latest Ref Range: 13.0 - 17.0 g/dL 13.8   HCT Latest Ref Range: 39.0 - 52.0 % 41.0   MCV Latest Ref Range: 78.0 - 100.0 fl 87.5   MCHC Latest Ref Range: 30.0 - 36.0 g/dL 33.7   RDW Latest Ref Range: 11.5 - 15.5 % 13.9   Platelets Latest Ref Range: 150.0 - 400.0 K/uL 256.0   Neutrophils Latest Ref Range: 43.0 - 77.0 % 63.7   Lymphocytes Latest Ref Range: 12.0 - 46.0 % 30.0   Monocytes Relative Latest Ref Range: 3.0 - 12.0 % 4.5   Eosinophil Latest Ref Range: 0.0 - 5.0 % 1.4   Basophil Latest Ref Range: 0.0 - 3.0 % 0.4   NEUT# Latest Ref  Range: 1.4 - 7.7 K/uL 5.7   Lymphocyte # Latest Ref Range: 0.7 - 4.0 K/uL 2.7   Monocyte # Latest Ref Range: 0.1 - 1.0 K/uL 0.4   Eosinophils Absolute Latest Ref Range: 0.0 - 0.7 K/uL 0.1   Basophils Absolute Latest Ref Range: 0.0 - 0.1 K/uL 0.0   Hemoglobin A1C Latest Ref Range: 4.6 - 6.5 % 6.4   Testosterone, Total, LC-MS-MS Latest Ref Range: 250 - 1,100 ng/dL  162 (L)  TSH Latest Ref Range: 0.35 - 4.50 uIU/mL 1.33   T4,Free(Direct) Latest Ref Range: 0.60 - 1.60 ng/dL 0.93   PSA Latest Ref Range: 0.10 - 4.00 ng/mL  0.58   ASSESSMENT / PLAN / RECOMMENDATIONS:   1)  Type 2 Diabetes Mellitus, Optimally Controlled with neuropathy - Most recent A1c of 6.4 %. Goal A1c < 7.0 %.   - His A1c is trending up , he was encouraged to stop drinking sugar-sweetened beverages, we discussed this is why he has no appetite to eat during the day - We also spent some time discussing examples of low CHO diet.  - BMP normal  EDUCATION / INSTRUCTIONS:  BG monitoring instructions: Patient is instructed to check his blood sugars 2 times a day, fasting and bedtime.  Call [facility] if: BG persistently < 70 or > 300. . I reviewed the Rule of 15 for the treatment of hypoglycemia in detail with the patient. Literature supplied.   2) Pituitary Microadenoma:   - Pt will have repeat MRI through neurosurgery.  - Clinically and biochemicaly there's no evidence of cortisol,  growth hormone excess or deficiency.  - TFT's have been normal.    3) Hypogonadotrophic hypogonadism:    - Testosterone continues to be low despite androgel treatment, will increase dose as below  - PSA and HC normal   Medications Increase Androgel to 3 pumps daily   4) Large arachnoid cyst:   - S/P resection on 08/04/2018 through  Dr. Owens Shark   F/U in 3 months  Signed electronically by: Mack Guise, MD  [facility]  [facility] [address] [place],  [place] [address] Phone: [phone] FAX: [phone]   CC: Kristen Loader, [address] [place] [address] Phone: [phone]  Fax: [phone]  Return to Endocrinology clinic as below: Future Appointments  Date Time Provider [place]  07/16/2019  2:00 PM [facility] None  07/22/2019  2:20 PM Rilynn Habel, Melanie Crazier, MD [facility] None

## 2019-04-21 NOTE — Patient Instructions (Signed)
-   Increase Androgel ( testosterone ) to THREE pumps daily

## 2019-04-27 ENCOUNTER — Other Ambulatory Visit: Payer: Self-pay

## 2019-04-27 DIAGNOSIS — Z20822 Contact with and (suspected) exposure to covid-19: Secondary | ICD-10-CM

## 2019-04-28 LAB — NOVEL CORONAVIRUS, NAA: SARS-CoV-2, NAA: DETECTED — AB

## 2019-06-17 ENCOUNTER — Other Ambulatory Visit: Payer: Self-pay | Admitting: Internal Medicine

## 2019-07-15 ENCOUNTER — Other Ambulatory Visit: Payer: Self-pay

## 2019-07-15 ENCOUNTER — Other Ambulatory Visit (INDEPENDENT_AMBULATORY_CARE_PROVIDER_SITE_OTHER): Payer: BC Managed Care – PPO

## 2019-07-15 DIAGNOSIS — E1142 Type 2 diabetes mellitus with diabetic polyneuropathy: Secondary | ICD-10-CM | POA: Diagnosis not present

## 2019-07-15 DIAGNOSIS — R7989 Other specified abnormal findings of blood chemistry: Secondary | ICD-10-CM

## 2019-07-15 LAB — CBC WITH DIFFERENTIAL/PLATELET
Basophils Absolute: 0.1 10*3/uL (ref 0.0–0.1)
Basophils Relative: 0.5 % (ref 0.0–3.0)
Eosinophils Absolute: 0.2 10*3/uL (ref 0.0–0.7)
Eosinophils Relative: 2 % (ref 0.0–5.0)
HCT: 41.2 % (ref 39.0–52.0)
Hemoglobin: 13.9 g/dL (ref 13.0–17.0)
Lymphocytes Relative: 29 % (ref 12.0–46.0)
Lymphs Abs: 3.1 10*3/uL (ref 0.7–4.0)
MCHC: 33.7 g/dL (ref 30.0–36.0)
MCV: 86.8 fl (ref 78.0–100.0)
Monocytes Absolute: 0.4 10*3/uL (ref 0.1–1.0)
Monocytes Relative: 4 % (ref 3.0–12.0)
Neutro Abs: 6.8 10*3/uL (ref 1.4–7.7)
Neutrophils Relative %: 64.5 % (ref 43.0–77.0)
Platelets: 296 10*3/uL (ref 150.0–400.0)
RBC: 4.75 Mil/uL (ref 4.22–5.81)
RDW: 14.3 % (ref 11.5–15.5)
WBC: 10.6 10*3/uL — ABNORMAL HIGH (ref 4.0–10.5)

## 2019-07-15 LAB — HEMOGLOBIN A1C: Hgb A1c MFr Bld: 6.6 % — ABNORMAL HIGH (ref 4.6–6.5)

## 2019-07-15 LAB — LIPID PANEL
Cholesterol: 193 mg/dL (ref 0–200)
HDL: 33.1 mg/dL — ABNORMAL LOW (ref >39.00)
LDL Cholesterol: 121 mg/dL — ABNORMAL HIGH (ref 0–99)
NonHDL: 160.14
Total CHOL/HDL Ratio: 6
Triglycerides: 195 mg/dL — ABNORMAL HIGH (ref 0.0–149.0)
VLDL: 39 mg/dL (ref 0.0–40.0)

## 2019-07-15 LAB — COMPREHENSIVE METABOLIC PANEL
ALT: 25 U/L (ref 0–53)
AST: 22 U/L (ref 0–37)
Albumin: 4.3 g/dL (ref 3.5–5.2)
Alkaline Phosphatase: 57 U/L (ref 39–117)
BUN: 9 mg/dL (ref 6–23)
CO2: 25 mEq/L (ref 19–32)
Calcium: 9.7 mg/dL (ref 8.4–10.5)
Chloride: 101 mEq/L (ref 96–112)
Creatinine, Ser: 0.76 mg/dL (ref 0.40–1.50)
GFR: 110.95 mL/min (ref >60.00)
Glucose, Bld: 146 mg/dL — ABNORMAL HIGH (ref 70–99)
Potassium: 3.6 mEq/L (ref 3.5–5.1)
Sodium: 136 mEq/L (ref 135–145)
Total Bilirubin: 0.4 mg/dL (ref 0.2–1.2)
Total Protein: 6.8 g/dL (ref 6.0–8.3)

## 2019-07-15 LAB — PSA: PSA: 0.43 ng/mL (ref 0.10–4.00)

## 2019-07-15 LAB — MICROALBUMIN / CREATININE URINE RATIO
Creatinine,U: 90 mg/dL
Microalb Creat Ratio: 1 mg/g (ref 0.0–30.0)
Microalb, Ur: 0.9 mg/dL (ref 0.0–1.9)

## 2019-07-16 ENCOUNTER — Other Ambulatory Visit: Payer: BC Managed Care – PPO

## 2019-07-18 LAB — TESTOSTERONE, TOTAL, LC/MS/MS: Testosterone, Total, LC-MS-MS: 236 ng/dL — ABNORMAL LOW (ref 250–1100)

## 2019-07-20 ENCOUNTER — Other Ambulatory Visit: Payer: Self-pay

## 2019-07-22 ENCOUNTER — Encounter: Payer: Self-pay | Admitting: Internal Medicine

## 2019-07-22 ENCOUNTER — Ambulatory Visit (INDEPENDENT_AMBULATORY_CARE_PROVIDER_SITE_OTHER): Payer: BC Managed Care – PPO | Admitting: Internal Medicine

## 2019-07-22 ENCOUNTER — Other Ambulatory Visit: Payer: Self-pay

## 2019-07-22 VITALS — BP 118/64 | HR 78 | Temp 98.0°F | Ht 70.0 in | Wt 276.8 lb

## 2019-07-22 DIAGNOSIS — E291 Testicular hypofunction: Secondary | ICD-10-CM | POA: Diagnosis not present

## 2019-07-22 DIAGNOSIS — E1142 Type 2 diabetes mellitus with diabetic polyneuropathy: Secondary | ICD-10-CM

## 2019-07-22 DIAGNOSIS — D352 Benign neoplasm of pituitary gland: Secondary | ICD-10-CM

## 2019-07-22 DIAGNOSIS — E230 Hypopituitarism: Secondary | ICD-10-CM

## 2019-07-22 DIAGNOSIS — E785 Hyperlipidemia, unspecified: Secondary | ICD-10-CM

## 2019-07-22 MED ORDER — ATORVASTATIN CALCIUM 20 MG PO TABS: 20.0000 mg | ORAL_TABLET | Freq: Every day | ORAL | 3 refills | Status: DC

## 2019-07-22 NOTE — Progress Notes (Signed)
Name: Cody Sullivan  Age/ Sex: 44 y.o., male   MRN/ DOB: [id number], Nov 03, 1975     PCP: Kristen Loader, FNP   Reason for Endocrinology Evaluation: Type 2 Diabetes Mellitus  Initial Endocrine Consultative Visit: 04/10/18    PATIENT IDENTIFIER: Cody Sullivan is a 44 y.o. male with a past medical history of OSA on CPAP, dyslipidemia and obesity . The patient has followed with Endocrinology clinic since 04/10/18 for consultative assistance with management of his diabetes.  DIABETIC HISTORY:  Cody Sullivan was diagnosed with T2DM in 2019, her was offered Metformin at the time of his diagnosis by his PCP., but the patient opted for diet control and lifestyle changes. His hemoglobin A1c has ranged from 6.3%  in 10/2017, peaking at 6.5% in 03/2018 .   Low Testosterone:   He was found to have a low testosterone in 03/2018 ( T. Testosterone 126 ng/dL) with inappropriately normal LH/FSH. 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   He is S/P Right frontal craniotomy on 08/04/2018 through Dr. Alfred Levins   He has been tired since 2017, he has noted somewhat decrease in libido, no difficulty with erections. He has noted gynecomastia for a while, no pain or discharge from the nipples. He also noted a subjective decrease in testicular size ~ 01/2018. He has 2 biological kids. He has not noticed any decrease in frequency of shaving.  He has chronic occasional headaches.   No known FH  of prostate cancer.   Testosterone started 01/2019  SUBJECTIVE:    Today (07/23/2019): Cody Sullivan is here for a 3 month follow up on his diabetes management and hypogonadism. He checks his blood sugars 1 time daily, preprandial to breakfast. The patient has not had hypoglycemic episodes since the last clinic visit.  He is S/P right frontal craniotomy on 08/04/2018 for arachnoid cysts. Continues to have headaches but they  seem stable.   In term of his low testosterone he has no side effects to the androgel but he also has not noted any changes in body habitus nor energy. He never had issues with ED.     ROS: As per HPI and as detailed below: Review of Systems  Constitutional: Negative for weight loss.  Gastrointestinal: Negative for diarrhea and nausea.  Genitourinary: Negative for frequency.  Neurological: Positive for headaches. Negative for dizziness.       Chronic Headaches   Endo/Heme/Allergies: Negative for polydipsia.  Psychiatric/Behavioral: Negative for depression. The patient is not nervous/anxious.       HOME ENDOCRINE REGIMEN:  Androgel 20.25 gram to a  shoulder      METER DOWNLOAD SUMMARY: 12/28-1/27/2021 Fingerstick Blood Glucose Tests = 28 Average Number Tests/Day = 0.9 Overall Mean FS Glucose = 133 Standard Deviation = 8  BG Ranges: Low = 109 High = 148   Hypoglycemic Events/30 Days: BG < 50 = 0 Episodes of symptomatic severe hypoglycemia = 0      DIABETIC COMPLICATIONS: Microvascular complications:    Denies: Neuropathy, CKD or retinopathy   Last Eye Exam: Completed 06/2018  Macrovascular complications:    Denies: CAD, CVA, PVD   HISTORY:  Past Medical History:  Past Medical History:  Diagnosis Date  . Allergy   . Diabetes mellitus without complication ([place])   . Hypertension   . Kidney stones   . Sleep apnea    cpap   Past  Surgical History:  Past Surgical History:  Procedure Laterality Date  . ANKLE SURGERY  2003   right   . cracked chest  2007   equipment fell on chest and cracked his chest, had pain related to injury, cardiologist confirmed not related to heart.    . CRANIOTOMY Right 08/04/2018   Right frontal - cyst removal  . LITHOTRIPSY  2010  . nose fracture  2000    Social History:  reports that he has never smoked. He has never used smokeless tobacco. He reports that he does not drink alcohol or use drugs.  Family History: No  family history of prostate ca.    HOME MEDICATIONS: Allergies as of 07/22/2019   No Known Allergies     Medication List       Accurate as of July 22, 2019 11:59 PM. If you have any questions, ask your nurse or doctor.        STOP taking these medications   pravastatin 20 MG tablet Commonly known as: PRAVACHOL Stopped by: Dorita Sciara, MD     TAKE these medications   atorvastatin 20 MG tablet Commonly known as: LIPITOR Take 1 tablet (20 mg total) by mouth daily. Started by: Dorita Sciara, MD   Contour Next EZ w/Device Kit 1 Device by Does not apply route 2 (two) times daily.   Contour Next Test test strip Generic drug: glucose blood USE AS INSTRUCTED   lisinopril-hydrochlorothiazide 20-25 MG tablet Commonly known as: ZESTORETIC TAKE 1 TABLET BY MOUTH EVERY DAY   Microlet Lancets Misc Use to check blood sugars once a day with Contour Next glucometer   minocycline 50 MG capsule Commonly known as: MINOCIN Take 50 mg by mouth 2 (two) times daily.   Testosterone 20.25 MG/ACT (1.62%) Gel Commonly known as: AndroGel Pump Place 81 mg onto the skin daily. What changed: how much to take Changed by: Dorita Sciara, MD         OBJECTIVE:   Vital Signs: BP 118/64 (BP Location: Left Arm, Patient Position: Sitting, Cuff Size: Large)   Pulse 78   Temp 98 F (36.7 C)   Ht '5\' 10"'$  (1.778 m)   Wt 276 lb 12.8 oz (125.6 kg)   SpO2 98%   BMI 39.72 kg/m   Wt Readings from Last 3 Encounters:  07/22/19 276 lb 12.8 oz (125.6 kg)  04/21/19 281 lb 12.8 oz (127.8 kg)  01/20/19 279 lb (126.6 kg)     Exam: General: Pt appears well and is in NAD  Lungs: Clear with good BS bilat with no rales, rhonchi, or wheezes  Heart: RRR with normal S1 and S2 and no gallops; no murmurs; no rub  Abdomen: Normoactive bowel sounds, soft, nontender, without masses or organomegaly palpable  Extremities: 1+ pretibial edema. No tremor. Normal strength and motion  throughout.  Skin: Normal texture and temperature to palpation. No rash noted.  Neuro: MS is good with appropriate affect, pt is alert and Ox3    DM foot exam: 04/21/2019 The skin of the feet is intact without sores or ulcerations.  The pedal pulses are 2+ on right and 2+ on left. The sensation is absent to a screening 5.07, 10 gram monofilament bilaterally at the toes.           DATA REVIEWED:  Lab Results  Component Value Date   HGBA1C 6.6 (H) 07/15/2019   HGBA1C 6.4 04/15/2019   HGBA1C 5.8 (A) 01/19/2019   Lab Results  Component Value Date  MICROALBUR 0.9 07/15/2019   LDLCALC 121 (H) 07/15/2019   CREATININE 0.76 07/15/2019    CT head (09/11/2018)   IMPRESSION: 1. Decreased pneumocephalus. Decreased extra-axial fluid, with decreased mass effect and midline shift. 2. Presumed drainage catheter is unchanged in appearance. 3. CSF density material extends into the extracranial soft tissues of the right side of the forehead. This is increased compared to the prior study although air which was present in this region on the prior exam has resolved. Results for Cody, Sullivan (MRN [id number]) as of 07/23/2019 08:30  Ref. Range 07/15/2019 14:56  Sodium Latest Ref Range: 135 - 145 mEq/L 136  Potassium Latest Ref Range: 3.5 - 5.1 mEq/L 3.6  Chloride Latest Ref Range: 96 - 112 mEq/L 101  CO2 Latest Ref Range: 19 - 32 mEq/L 25  Glucose Latest Ref Range: 70 - 99 mg/dL 146 (H)  BUN Latest Ref Range: 6 - 23 mg/dL 9  Creatinine Latest Ref Range: 0.40 - 1.50 mg/dL 0.76  Calcium Latest Ref Range: 8.4 - 10.5 mg/dL 9.7  Alkaline Phosphatase Latest Ref Range: 39 - 117 U/L 57  Albumin Latest Ref Range: 3.5 - 5.2 g/dL 4.3  AST Latest Ref Range: 0 - 37 U/L 22  ALT Latest Ref Range: 0 - 53 U/L 25  Total Protein Latest Ref Range: 6.0 - 8.3 g/dL 6.8  Total Bilirubin Latest Ref Range: 0.2 - 1.2 mg/dL 0.4  GFR Latest Ref Range: >60.00 mL/min 110.95  Total CHOL/HDL Ratio Unknown 6  Cholesterol  Latest Ref Range: 0 - 200 mg/dL 193  HDL Cholesterol Latest Ref Range: >39.00 mg/dL 33.10 (L)  LDL (calc) Latest Ref Range: 0 - 99 mg/dL 121 (H)  MICROALB/CREAT RATIO Latest Ref Range: 0.0 - 30.0 mg/g 1.0  NonHDL Unknown 160.14  Triglycerides Latest Ref Range: 0.0 - 149.0 mg/dL 195.0 (H)  VLDL Latest Ref Range: 0.0 - 40.0 mg/dL 39.0  WBC Latest Ref Range: 4.0 - 10.5 K/uL 10.6 (H)  RBC Latest Ref Range: 4.22 - 5.81 Mil/uL 4.75  Hemoglobin Latest Ref Range: 13.0 - 17.0 g/dL 13.9  HCT Latest Ref Range: 39.0 - 52.0 % 41.2  MCV Latest Ref Range: 78.0 - 100.0 fl 86.8  MCHC Latest Ref Range: 30.0 - 36.0 g/dL 33.7  RDW Latest Ref Range: 11.5 - 15.5 % 14.3  Platelets Latest Ref Range: 150.0 - 400.0 K/uL 296.0  Neutrophils Latest Ref Range: 43.0 - 77.0 % 64.5  Lymphocytes Latest Ref Range: 12.0 - 46.0 % 29.0  Monocytes Relative Latest Ref Range: 3.0 - 12.0 % 4.0  Eosinophil Latest Ref Range: 0.0 - 5.0 % 2.0  Basophil Latest Ref Range: 0.0 - 3.0 % 0.5  NEUT# Latest Ref Range: 1.4 - 7.7 K/uL 6.8  Lymphocyte # Latest Ref Range: 0.7 - 4.0 K/uL 3.1  Monocyte # Latest Ref Range: 0.1 - 1.0 K/uL 0.4  Eosinophils Absolute Latest Ref Range: 0.0 - 0.7 K/uL 0.2  Basophils Absolute Latest Ref Range: 0.0 - 0.1 K/uL 0.1  Hemoglobin A1C Latest Ref Range: 4.6 - 6.5 % 6.6 (H)  Testosterone, Total, LC-MS-MS Latest Ref Range: 250 - 1,100 ng/dL 236 (L)  PSA Latest Ref Range: 0.10 - 4.00 ng/mL 0.43  Creatinine,U Latest Units: mg/dL 90.0  Microalb, Ur Latest Ref Range: 0.0 - 1.9 mg/dL 0.9     ASSESSMENT / PLAN / RECOMMENDATIONS:   1)  Type 2 Diabetes Mellitus, Optimally Controlled with neuropathy - Most recent A1c of 6.6 %. Goal A1c < 7.0 %.   -  His A1c continues to be at goal with lifestyle changes.  - I have praised him on the weight loss, he is planning on starting a keto diet. He also stopped his diet sodas.   EDUCATION / INSTRUCTIONS:  BG monitoring instructions: Patient is instructed to check his  blood sugars 3 times a week, fasting .   Call [facility] if: BG persistently < 70 or > 300. . I reviewed the Rule of 15 for the treatment of hypoglycemia in detail with the patient. Literature supplied.   2) Pituitary Microadenoma:   - Pt will have repeat MRI through neurosurgery.  - Clinically and biochemicaly there's no evidence of cortisol,  growth hormone excess or deficiency.  - TFT's have been normal.    3) Hypogonadotrophic hypogonadism:    - Testosterone level is improving but still be low goal - Will increase to 4 pumps daily, I have advised him that if his levels continue to be below goal, will consider injections  - PSA and HC normal   Medications  Increase Androgel to 4 pumps daily   4) Large arachnoid cyst:   - S/P resection on 08/04/2018 through  Dr. Owens Shark  5) Dyslipidemia :  - LDL continues to be above goal. Will switch pravastatin to atorvastatin.    Medications:  Atorvastatin 20 mg daily   F/U in 3 months    Signed electronically by: Mack Guise, MD  [facility]  [facility] [address] [place], [place] [address] Phone: [phone] FAX: [phone]   CC: Kristen Loader, [place] [address] Phone: [phone]  Fax: [phone]  Return to Endocrinology clinic as below: Future Appointments  Date Time Provider [place]  10/14/2019  2:00 PM [facility] None  10/21/2019  2:00 PM Branae Crail, Melanie Crazier, MD [facility] None

## 2019-07-22 NOTE — Patient Instructions (Signed)
-   Keep up the good work as far as diet and weight loss.  - Increase Androgel ( testosterone ) to FOUR pumps daily  - I switched pravastatin to Atorvastatin 20 mg daily

## 2019-07-23 DIAGNOSIS — E785 Hyperlipidemia, unspecified: Secondary | ICD-10-CM | POA: Insufficient documentation

## 2019-07-23 MED ORDER — TESTOSTERONE 20.25 MG/ACT (1.62%) TD GEL: 81.0000 mg | Freq: Every day | TRANSDERMAL | 5 refills | Status: DC

## 2019-08-03 ENCOUNTER — Encounter (HOSPITAL_BASED_OUTPATIENT_CLINIC_OR_DEPARTMENT_OTHER): Payer: Self-pay | Admitting: *Deleted

## 2019-08-03 ENCOUNTER — Emergency Department (HOSPITAL_BASED_OUTPATIENT_CLINIC_OR_DEPARTMENT_OTHER)
Admission: EM | Admit: 2019-08-03 | Discharge: 2019-08-03 | Disposition: A | Discharge status: Home / Self Care | Payer: BC Managed Care – PPO | Attending: Emergency Medicine | Admitting: Emergency Medicine

## 2019-08-03 ENCOUNTER — Emergency Department (HOSPITAL_BASED_OUTPATIENT_CLINIC_OR_DEPARTMENT_OTHER): Payer: BC Managed Care – PPO

## 2019-08-03 ENCOUNTER — Other Ambulatory Visit: Payer: Self-pay

## 2019-08-03 DIAGNOSIS — E669 Obesity, unspecified: Secondary | ICD-10-CM | POA: Insufficient documentation

## 2019-08-03 DIAGNOSIS — Z6838 Body mass index (BMI) 38.0-38.9, adult: Secondary | ICD-10-CM | POA: Insufficient documentation

## 2019-08-03 DIAGNOSIS — I10 Essential (primary) hypertension: Secondary | ICD-10-CM | POA: Insufficient documentation

## 2019-08-03 DIAGNOSIS — Z79899 Other long term (current) drug therapy: Secondary | ICD-10-CM | POA: Insufficient documentation

## 2019-08-03 DIAGNOSIS — E119 Type 2 diabetes mellitus without complications: Secondary | ICD-10-CM | POA: Insufficient documentation

## 2019-08-03 DIAGNOSIS — R1084 Generalized abdominal pain: Secondary | ICD-10-CM | POA: Diagnosis present

## 2019-08-03 LAB — URINALYSIS, ROUTINE W REFLEX MICROSCOPIC
Bilirubin Urine: NEGATIVE
Glucose, UA: NEGATIVE mg/dL
Hgb urine dipstick: NEGATIVE
Ketones, ur: NEGATIVE mg/dL
Leukocytes,Ua: NEGATIVE
Nitrite: NEGATIVE
Protein, ur: NEGATIVE mg/dL
Specific Gravity, Urine: >1.03 — ABNORMAL HIGH (ref 1.005–1.030)
pH: 5.5 (ref 5.0–8.0)

## 2019-08-03 LAB — CBC WITH DIFFERENTIAL/PLATELET
Abs Immature Granulocytes: 0.02 10*3/uL (ref 0.00–0.07)
Basophils Absolute: 0 10*3/uL (ref 0.0–0.1)
Basophils Relative: 0 %
Eosinophils Absolute: 0.1 10*3/uL (ref 0.0–0.5)
Eosinophils Relative: 1 %
HCT: 41.3 % (ref 39.0–52.0)
Hemoglobin: 13.6 g/dL (ref 13.0–17.0)
Immature Granulocytes: 0 %
Lymphocytes Relative: 24 %
Lymphs Abs: 2.2 10*3/uL (ref 0.7–4.0)
MCH: 29.5 pg (ref 26.0–34.0)
MCHC: 32.9 g/dL (ref 30.0–36.0)
MCV: 89.6 fL (ref 80.0–100.0)
Monocytes Absolute: 0.5 10*3/uL (ref 0.1–1.0)
Monocytes Relative: 6 %
Neutro Abs: 6.5 10*3/uL (ref 1.7–7.7)
Neutrophils Relative %: 69 %
Platelets: 304 10*3/uL (ref 150–400)
RBC: 4.61 MIL/uL (ref 4.22–5.81)
RDW: 13.8 % (ref 11.5–15.5)
WBC: 9.4 10*3/uL (ref 4.0–10.5)
nRBC: 0 % (ref 0.0–0.2)

## 2019-08-03 LAB — COMPREHENSIVE METABOLIC PANEL
ALT: 28 U/L (ref 0–44)
AST: 24 U/L (ref 15–41)
Albumin: 3.8 g/dL (ref 3.5–5.0)
Alkaline Phosphatase: 47 U/L (ref 38–126)
Anion gap: 9 (ref 5–15)
BUN: 16 mg/dL (ref 6–20)
CO2: 22 mmol/L (ref 22–32)
Calcium: 8.9 mg/dL (ref 8.9–10.3)
Chloride: 104 mmol/L (ref 98–111)
Creatinine, Ser: 0.89 mg/dL (ref 0.61–1.24)
GFR calc Af Amer: >60 mL/min (ref >60)
GFR calc non Af Amer: >60 mL/min (ref >60)
Glucose, Bld: 154 mg/dL — ABNORMAL HIGH (ref 70–99)
Potassium: 3.5 mmol/L (ref 3.5–5.1)
Sodium: 135 mmol/L (ref 135–145)
Total Bilirubin: 0.6 mg/dL (ref 0.3–1.2)
Total Protein: 6.9 g/dL (ref 6.5–8.1)

## 2019-08-03 LAB — LIPASE, BLOOD: Lipase: 38 U/L (ref 11–51)

## 2019-08-03 MED ORDER — ONDANSETRON HCL 4 MG/2ML IJ SOLN
4.0000 mg | Freq: Once | INTRAMUSCULAR | Status: AC
  Administered 2019-08-03: 4 mg via INTRAVENOUS
  Filled 2019-08-03: qty 2

## 2019-08-03 MED ORDER — HYDROCODONE-ACETAMINOPHEN 5-325 MG PO TABS: 1.0000 | ORAL_TABLET | Freq: Four times a day (QID) | ORAL | 0 refills | Status: DC | PRN

## 2019-08-03 MED ORDER — OXYCODONE-ACETAMINOPHEN 5-325 MG PO TABS
1.0000 | ORAL_TABLET | Freq: Once | ORAL | Status: AC
  Administered 2019-08-03: 1 via ORAL
  Filled 2019-08-03: qty 1

## 2019-08-03 MED ORDER — SODIUM CHLORIDE 0.9 % IV BOLUS
1000.0000 mL | Freq: Once | INTRAVENOUS | Status: AC
  Administered 2019-08-03: 1000 mL via INTRAVENOUS

## 2019-08-03 MED ORDER — KETOROLAC TROMETHAMINE 15 MG/ML IJ SOLN
15.0000 mg | Freq: Once | INTRAMUSCULAR | Status: AC
  Administered 2019-08-03: 15 mg via INTRAVENOUS
  Filled 2019-08-03: qty 1

## 2019-08-03 MED ORDER — MORPHINE SULFATE (PF) 4 MG/ML IV SOLN
4.0000 mg | Freq: Once | INTRAVENOUS | Status: AC
  Administered 2019-08-03: 4 mg via INTRAVENOUS
  Filled 2019-08-03: qty 1

## 2019-08-03 NOTE — ED Notes (Signed)
MD with pt  

## 2019-08-03 NOTE — ED Notes (Signed)
Tolerated po fluids

## 2019-08-03 NOTE — ED Notes (Signed)
Returned from xray

## 2019-08-03 NOTE — ED Notes (Signed)
Returned from CT.

## 2019-08-03 NOTE — ED Provider Notes (Signed)
[facility] EMERGENCY DEPARTMENT Provider Note   CSN: [id number] Arrival date & time: 08/03/19  0239     History Chief Complaint  Patient presents with  . abdominal and back pain    Cody Sullivan is a 44 y.o. male.  HPI     This 44 year old male with history of diabetes, hypertension, kidney stones who presents with abdominal pain.  Patient reports acute onset of abdominal pain at 9 PM last night.  He reports that it is across his abdomen and radiates into his back.  It is similar in character to his prior kidney stones but does not lateralize.  He rates his pain at 10 out of 10.  Nothing seems to make it better or worse.  No association with food.  He did not take anything at home for pain.  Reports urinary urgency but no dysuria.  Has had 2 episodes of nonbilious, nonbloody emesis.  No diarrhea or constipation.  Denies fever or upper respiratory symptoms.  Past Medical History:  Diagnosis Date  . Allergy   . Diabetes mellitus without complication ([place])   . Hypertension   . Kidney stones   . Sleep apnea    cpap    Patient Active Problem List   Diagnosis Date Noted  . Dyslipidemia 07/23/2019  . Type 2 diabetes mellitus with diabetic polyneuropathy, without long-term current use of insulin ([place]) 04/21/2019  . 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 Hypogonadism in male 12/26/2018  . Type 2 diabetes mellitus without complication, without long-term current use of insulin ([place]) 07/22/2018  . Low testosterone 07/22/2018  . Pituitary microadenoma ([place]) 07/22/2018  . 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 Essential hypertension 06/04/2018  . HTN, goal below 130/80 02/25/2013  . Hyperlipidemia 02/25/2013  . Poison ivy dermatitis 11/19/2012  . Obesity, unspecified 03/26/2012  . Allergic rhinitis 10/19/2009    Past Surgical History:  Procedure Laterality Date  . ANKLE SURGERY  2003   right   . cracked chest  2007   equipment fell on chest and  cracked his chest, had pain related to injury, cardiologist confirmed not related to heart.    . CRANIOTOMY Right 08/04/2018   Right frontal - cyst removal  . LITHOTRIPSY  2010  . nose fracture  2000       No family history on file.  Social History   Tobacco Use  . Smoking status: Never Smoker  . Smokeless tobacco: Never Used  Substance Use Topics  . Alcohol use: No    Alcohol/week: 0.0 standard drinks  . Drug use: No    Home Medications Prior to Admission medications   Medication Sig Start Date End Date Taking? Authorizing Provider  atorvastatin (LIPITOR) 20 MG tablet Take 1 tablet (20 mg total) by mouth daily. 07/22/19   Shamleffer, Melanie Crazier, MD  Blood Glucose Monitoring Suppl (CONTOUR NEXT EZ) w/Device KIT 1 Device by Does not apply route 2 (two) times daily. 04/10/18   Shamleffer, Melanie Crazier, MD  CONTOUR NEXT TEST test strip USE AS INSTRUCTED 06/17/19   Shamleffer, Melanie Crazier, MD  HYDROcodone-acetaminophen (NORCO/VICODIN) 5-325 MG tablet Take 1 tablet by mouth every 6 (six) hours as needed. 08/03/19   Yelina Sarratt, Barbette Hair, MD  lisinopril-hydrochlorothiazide (PRINZIDE,ZESTORETIC) 20-25 MG tablet TAKE 1 TABLET BY MOUTH EVERY DAY 08/14/18   Richardo Priest, MD  MICROLET LANCETS MISC Use to check blood sugars once a day with Contour Next glucometer 04/10/18   Shamleffer, Melanie Crazier, MD  minocycline (MINOCIN,DYNACIN) 50 MG capsule  Take 50 mg by mouth 2 (two) times daily.    [provider]  Testosterone (ANDROGEL PUMP) 20.25 MG/ACT (1.62%) GEL Place 81 mg onto the skin daily. 07/23/19   Shamleffer, Melanie Crazier, MD    Allergies    Patient has no known allergies.  Review of Systems   Review of Systems  Constitutional: Negative for fever.  Respiratory: Negative for shortness of breath.   Cardiovascular: Negative for chest pain.  Gastrointestinal: Positive for abdominal pain.  Genitourinary: Positive for urgency. Negative for dysuria and hematuria.   Musculoskeletal: Positive for back pain.  All other systems reviewed and are negative.   Physical Exam Updated Vital Signs BP (!) 119/58   Pulse (!) 51   Temp 97.9 F (36.6 C) (Oral)   Resp 18   Ht 1.778 m (5' 10")   Wt 122.5 kg   SpO2 96%   BMI 38.74 kg/m   Physical Exam Vitals and nursing note reviewed.  Constitutional:      Appearance: He is well-developed.     Comments: Obese, uncomfortable appearing but nontoxic  HENT:     Head: Normocephalic and atraumatic.     Mouth/Throat:     Mouth: Mucous membranes are moist.  Eyes:     Pupils: Pupils are equal, round, and reactive to light.  Cardiovascular:     Rate and Rhythm: Normal rate and regular rhythm.     Heart sounds: Normal heart sounds. No murmur.  Pulmonary:     Effort: Pulmonary effort is normal. No respiratory distress.     Breath sounds: Normal breath sounds. No wheezing.  Abdominal:     General: Bowel sounds are normal.     Palpations: Abdomen is soft.     Tenderness: There is no abdominal tenderness. There is no right CVA tenderness, left CVA tenderness or rebound.  Musculoskeletal:     Cervical back: Neck supple.     Right lower leg: No edema.     Left lower leg: No edema.  Lymphadenopathy:     Cervical: No cervical adenopathy.  Skin:    General: Skin is warm and dry.  Neurological:     Mental Status: He is alert and oriented to person, place, and time.  Psychiatric:        Mood and Affect: Mood normal.     ED Results / Procedures / Treatments   Labs (all labs ordered are listed, but only abnormal results are displayed) Labs Reviewed  URINALYSIS, ROUTINE W REFLEX MICROSCOPIC - Abnormal; Notable for the following components:      Result Value   Specific Gravity, Urine >1.030 (*)    All other components within normal limits  COMPREHENSIVE METABOLIC PANEL - Abnormal; Notable for the following components:   Glucose, Bld 154 (*)    All other components within normal limits  CBC WITH  DIFFERENTIAL/PLATELET  LIPASE, BLOOD    EKG None  Radiology DG Abdomen 1 View  Result Date: 08/03/2019 CLINICAL DATA:  Abdominal pain. History of stone disease. Vomiting. EXAM: ABDOMEN - 1 VIEW COMPARISON:  CT 01/20/2019 FINDINGS: Bowel gas pattern is normal. No abnormal abdominal or pelvic calcifications. Ordinary mild degenerative changes affect the lumbar spine. IMPRESSION: No visible urinary tract stone disease.  Bowel gas pattern normal. Electronically Signed   By: Nelson Chimes M.D.   On: 08/03/2019 03:35   CT Renal Stone Study  Result Date: 08/03/2019 CLINICAL DATA:  Flank pain. Pain radiates to the back. Urinary frequency. History of kidney stones. EXAM: CT  ABDOMEN AND PELVIS WITHOUT CONTRAST TECHNIQUE: Multidetector CT imaging of the abdomen and pelvis was performed following the standard protocol without IV contrast. COMPARISON:  Radiograph earlier this day. CT 01/20/2019 FINDINGS: Lower chest: Hypoventilatory changes at the lung bases. No pleural fluid. Hepatobiliary: Decreased hepatic density consistent with steatosis. Focal fatty sparing adjacent the gallbladder fossa. There is pericholecystic fat stranding, series 2, image 32. No calcified gallstone. Pancreas: No ductal dilatation or inflammation. Spleen: Normal in size without focal abnormality. Adrenals/Urinary Tract: Normal adrenal glands. No hydronephrosis. No perinephric edema. No renal stones. Previous punctate left renal stones are no longer seen. Both ureters are decompressed without stones along the course. Urinary bladder is partially distended. No bladder wall thickening or bladder stone. Stomach/Bowel: Small paraesophageal lymph nodes appear similar to prior. Stomach physiologically distended. No small bowel wall thickening or inflammation. No evidence of appendicitis. Appendix not well-defined on the current exam. Colonic diverticulosis from the splenic flexure distally. No acute diverticulitis. Vascular/Lymphatic: Abdominal  aorta is normal in caliber. Few prominent periportal nodes are likely reactive. Reproductive: Prostate is unremarkable. Other: No free air, free fluid, or intra-abdominal fluid collection. Small fat containing umbilical hernia. Musculoskeletal: Multilevel degenerative change in the lumbar spine. There are no acute or suspicious osseous abnormalities. IMPRESSION: 1. Fat stranding about the gallbladder, suspicious for acute gallbladder inflammation/cholecystitis. Recommend right upper quadrant ultrasound. No calcified gallstone. 2. No renal stones or obstructive uropathy. Previous punctate left renal stones are no longer seen. 3. Incidental findings of colonic diverticulosis without diverticulitis and hepatic steatosis. Electronically Signed   By: Keith Rake M.D.   On: 08/03/2019 06:37    Procedures Procedures (including critical care time)  Medications Ordered in ED Medications  sodium chloride 0.9 % bolus 1,000 mL (0 mLs Intravenous Stopped 08/03/19 0452)  morphine 4 MG/ML injection 4 mg (4 mg Intravenous Given 08/03/19 0318)  ondansetron (ZOFRAN) injection 4 mg (4 mg Intravenous Given 08/03/19 0318)  ketorolac (TORADOL) 15 MG/ML injection 15 mg (15 mg Intravenous Given 08/03/19 0404)  oxyCODONE-acetaminophen (PERCOCET/ROXICET) 5-325 MG per tablet 1 tablet (1 tablet Oral Given 08/03/19 3335)    ED Course  I have reviewed the triage vital signs and the nursing notes.  Pertinent labs & imaging results that were available during my care of the patient were reviewed by me and considered in my medical decision making (see chart for details).  Clinical Course as of Aug 02 650  Mon Aug 03, 2019  0434 Spoke to patient regarding reassuring work-up.  He would like to defer further imaging as pain is improving and now 6 out of 10.  We will continue to monitor.  Will likely treated for presumed kidney stone.   [CH]  0615 Patient's pain now 5 out of 10.  Abdomen is nontender.  Patient states that he  wishes to proceed CT imaging at this time to "find the cause."  I discussed with him that I had low suspicion for appendicitis or gallbladder pathology given that his exam is nontender.  Patient stated understanding but would still like to pursue.   [CH]    Clinical Course User Index [CH] Horton, Barbette Hair, MD   MDM Rules/Calculators/A&P                       Final Clinical Impression(s) / ED Diagnoses Final diagnoses:  Generalized abdominal pain   Patient presents with abdominal pain.  It does not lateralize.  It was fairly abrupt in onset.  He is uncomfortable  appearing but nontoxic on exam and vital signs are reassuring.  His exam is nontender.  No CVA tenderness or abdominal tenderness.  Lab work obtained and patient was given pain and nausea medication.  KUB was also obtained.  Lab work-up is normal including lipase, LFTs, white blood cell count.  See clinical course above.  Initially, patient deferred CT imaging and was being presumptively treated for possible kidney stone.  However, he subsequently requested CT imaging.  CT scan does not show kidney stone.  It is largely unremarkable.  He does have some fat stranding around the gallbladder but no gallstones.  Clinically his presentation is not consistent with cholecystitis.  His exam is nontender and his labs are reassuring.  I did give the patient the option of ultrasound imaging.  At this time he states he feels much better and would like to go home.  He was advised if he develops symptoms related to eating or right upper quadrant pain, he may need a right upper quadrant ultrasound.  Patient stated understanding.  Will discharge home with short course of Norco.  After history, exam, and medical workup I feel the patient has been appropriately medically screened and is safe for discharge home. Pertinent diagnoses were discussed with the patient. Patient was given return precautions.  Rx / DC Orders ED Discharge Orders         Ordered     HYDROcodone-acetaminophen (NORCO/VICODIN) 5-325 MG tablet  Every 6 hours PRN     08/03/19 0650           Merryl Hacker, MD 08/03/19 [phone]

## 2019-08-03 NOTE — ED Notes (Signed)
To CT

## 2019-08-03 NOTE — ED Notes (Signed)
Pt is waiting for his ride home. Sprite given.

## 2019-08-03 NOTE — ED Triage Notes (Signed)
Pt presents with mid abd pain that radiates into his back. Pt is unable to sit still. C/o urinary frequency. C/o vomiting times 2. States pain started at 9pm. Hx of kidney stones. Has not taken anything for the pain.

## 2019-08-03 NOTE — ED Notes (Signed)
To x-ray

## 2019-08-03 NOTE — ED Notes (Signed)
Po challenge with sprite

## 2019-08-03 NOTE — ED Notes (Signed)
Walked pt to his car. No complaints at d/c home.

## 2019-08-03 NOTE — ED Notes (Signed)
Pt states he is still having pain, but does not want anything further for pain at this time. Instructed to call if he needs something for pain.

## 2019-08-03 NOTE — Discharge Instructions (Addendum)
You were seen today for abdominal pain.  Your work-up is reassuring including normal labs.  You did have a CT scan.  This does not show any kidney stones.  You had a small amount of stranding around her gallbladder but no gallstones.  Given your nontender exam and your reassuring lab work, I have low suspicion for gallbladder etiology.  However, if you develop symptoms with eating or worsening right upper quadrant pain, you need to be reevaluated for possible ultrasound imaging

## 2019-09-16 ENCOUNTER — Other Ambulatory Visit: Payer: Self-pay | Admitting: Internal Medicine

## 2019-09-16 NOTE — Telephone Encounter (Signed)
Is this ok to refill?  Dr [facility] patient.Marland Kitchen

## 2019-10-10 ENCOUNTER — Other Ambulatory Visit: Payer: Self-pay | Admitting: Dermatology

## 2019-10-14 ENCOUNTER — Other Ambulatory Visit: Payer: Self-pay

## 2019-10-14 ENCOUNTER — Other Ambulatory Visit (INDEPENDENT_AMBULATORY_CARE_PROVIDER_SITE_OTHER): Payer: BC Managed Care – PPO

## 2019-10-14 ENCOUNTER — Other Ambulatory Visit: Payer: BC Managed Care – PPO

## 2019-10-14 ENCOUNTER — Ambulatory Visit: Payer: BC Managed Care – PPO | Admitting: Internal Medicine

## 2019-10-14 DIAGNOSIS — E291 Testicular hypofunction: Secondary | ICD-10-CM | POA: Diagnosis not present

## 2019-10-14 DIAGNOSIS — E1142 Type 2 diabetes mellitus with diabetic polyneuropathy: Secondary | ICD-10-CM | POA: Diagnosis not present

## 2019-10-15 LAB — CBC WITH DIFFERENTIAL/PLATELET
Basophils Absolute: 0.1 10*3/uL (ref 0.0–0.1)
Basophils Relative: 0.7 % (ref 0.0–3.0)
Eosinophils Absolute: 0.1 10*3/uL (ref 0.0–0.7)
Eosinophils Relative: 1.4 % (ref 0.0–5.0)
HCT: 43.5 % (ref 39.0–52.0)
Hemoglobin: 14.5 g/dL (ref 13.0–17.0)
Lymphocytes Relative: 33.1 % (ref 12.0–46.0)
Lymphs Abs: 3.4 10*3/uL (ref 0.7–4.0)
MCHC: 33.4 g/dL (ref 30.0–36.0)
MCV: 86.9 fl (ref 78.0–100.0)
Monocytes Absolute: 0.7 10*3/uL (ref 0.1–1.0)
Monocytes Relative: 6.5 % (ref 3.0–12.0)
Neutro Abs: 6.1 10*3/uL (ref 1.4–7.7)
Neutrophils Relative %: 58.3 % (ref 43.0–77.0)
Platelets: 310 10*3/uL (ref 150.0–400.0)
RBC: 5 Mil/uL (ref 4.22–5.81)
RDW: 13.8 % (ref 11.5–15.5)
WBC: 10.4 10*3/uL (ref 4.0–10.5)

## 2019-10-15 LAB — COMPREHENSIVE METABOLIC PANEL
ALT: 18 U/L (ref 0–53)
AST: 20 U/L (ref 0–37)
Albumin: 4.2 g/dL (ref 3.5–5.2)
Alkaline Phosphatase: 61 U/L (ref 39–117)
BUN: 11 mg/dL (ref 6–23)
CO2: 27 mEq/L (ref 19–32)
Calcium: 9.3 mg/dL (ref 8.4–10.5)
Chloride: 103 mEq/L (ref 96–112)
Creatinine, Ser: 0.92 mg/dL (ref 0.40–1.50)
GFR: 88.9 mL/min (ref >60.00)
Glucose, Bld: 99 mg/dL (ref 70–99)
Potassium: 3.8 mEq/L (ref 3.5–5.1)
Sodium: 139 mEq/L (ref 135–145)
Total Bilirubin: 0.5 mg/dL (ref 0.2–1.2)
Total Protein: 6.8 g/dL (ref 6.0–8.3)

## 2019-10-15 LAB — PSA: PSA: 0.71 ng/mL (ref 0.10–4.00)

## 2019-10-16 LAB — TESTOSTERONE, TOTAL, LC/MS/MS: Testosterone, Total, LC-MS-MS: 583 ng/dL (ref 250–1100)

## 2019-10-21 ENCOUNTER — Ambulatory Visit: Payer: BC Managed Care – PPO | Admitting: Internal Medicine

## 2019-10-21 ENCOUNTER — Other Ambulatory Visit: Payer: Self-pay

## 2019-10-23 ENCOUNTER — Ambulatory Visit: Payer: Self-pay

## 2019-10-23 ENCOUNTER — Encounter: Payer: Self-pay | Admitting: Internal Medicine

## 2019-10-23 ENCOUNTER — Other Ambulatory Visit: Payer: Self-pay

## 2019-10-23 ENCOUNTER — Ambulatory Visit (INDEPENDENT_AMBULATORY_CARE_PROVIDER_SITE_OTHER): Payer: BC Managed Care – PPO | Admitting: Internal Medicine

## 2019-10-23 ENCOUNTER — Other Ambulatory Visit: Payer: Self-pay | Admitting: Family Medicine

## 2019-10-23 VITALS — BP 118/64 | HR 68 | Temp 98.1°F | Ht 70.0 in | Wt 255.2 lb

## 2019-10-23 DIAGNOSIS — E291 Testicular hypofunction: Secondary | ICD-10-CM

## 2019-10-23 DIAGNOSIS — E1142 Type 2 diabetes mellitus with diabetic polyneuropathy: Secondary | ICD-10-CM | POA: Diagnosis not present

## 2019-10-23 DIAGNOSIS — D352 Benign neoplasm of pituitary gland: Secondary | ICD-10-CM | POA: Diagnosis not present

## 2019-10-23 DIAGNOSIS — Z0000 Encounter for general adult medical examination without abnormal findings: Secondary | ICD-10-CM

## 2019-10-23 LAB — POCT GLYCOSYLATED HEMOGLOBIN (HGB A1C): Hemoglobin A1C: 5.6 % (ref 4.0–5.6)

## 2019-10-23 MED ORDER — TESTOSTERONE 20.25 MG/ACT (1.62%) TD GEL: TRANSDERMAL | 4 refills | Status: DC

## 2019-10-23 NOTE — Patient Instructions (Addendum)
-   Continue  Androgel ( testosterone ) at FOUR pumps daily  - Continue Atorvastatin 20 mg daily

## 2019-10-23 NOTE — Progress Notes (Signed)
Name: Cody Sullivan  Age/ Sex: 44 y.o., male   MRN/ DOB: [id number], Apr 06, 1976     PCP: Kristen Loader, FNP   Reason for Endocrinology Evaluation: Type 2 Diabetes Mellitus  Initial Endocrine Consultative Visit: 04/10/18    PATIENT IDENTIFIER: Mr. DENSIL OTTEY is a 44 y.o. male with a past medical history of OSA on CPAP, dyslipidemia and obesity . The patient has followed with Endocrinology clinic since 04/10/18 for consultative assistance with management of his diabetes.  DIABETIC HISTORY:  Mr. Wingert was diagnosed with T2DM in 2019, her was offered Metformin at the time of his diagnosis by his PCP., but the patient opted for diet control and lifestyle changes. His hemoglobin A1c has ranged from 6.3%  in 10/2017, peaking at 6.5% in 03/2018 .   Low Testosterone:   He was found to have a low testosterone in 03/2018 ( T. Testosterone 126 ng/dL) with inappropriately normal LH/FSH. 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   He is S/P Right frontal craniotomy on 08/04/2018 through Dr. Alfred Levins   He has been tired since 2017, he has noted somewhat decrease in libido, no difficulty with erections. He has noted gynecomastia for a while, no pain or discharge from the nipples. He also noted a subjective decrease in testicular size ~ 01/2018. He has 2 biological kids. He has not noticed any decrease in frequency of shaving.  He has chronic occasional headaches.   No known FH  of prostate cancer.   Testosterone started 01/2019  SUBJECTIVE:   Today (10/23/2019): Mr. Ashmead is here for a 3 month follow up on his diabetes management and hypogonadism. He checks his blood sugars 1 time daily, preprandial to breakfast. The patient has not had hypoglycemic episodes since the last clinic visit.  He is S/P right frontal craniotomy on 08/04/2018 for arachnoid cysts. Continues to have headaches but they  seem stable.     In term of his low testosterone he has no side effects to the androgel but he also has not noted any changes in body habitus nor energy. He never had issues with ED.     ROS: As per HPI and as detailed below: Review of Systems  Constitutional: Positive for weight loss.  Gastrointestinal: Negative for diarrhea and nausea.  Genitourinary: Negative for frequency.  Neurological:       Chronic Headaches   Endo/Heme/Allergies: Positive for polydipsia.  Psychiatric/Behavioral: Negative for depression. The patient is not nervous/anxious.       HOME ENDOCRINE REGIMEN:  Androgel 4 pumps daily      METER DOWNLOAD SUMMARY: 3/29-4/28/2021 Fingerstick Blood Glucose Tests = 29 Average Number Tests/Day = 0.9 Overall Mean FS Glucose = 125 Standard Deviation = 12  BG Ranges: Low = 94 High = 165   Hypoglycemic Events/30 Days: BG < 50 = 0 Episodes of symptomatic severe hypoglycemia = 0      DIABETIC COMPLICATIONS: Microvascular complications:    Denies: Neuropathy, CKD or retinopathy   Last Eye Exam: Completed 06/2018  Macrovascular complications:    Denies: CAD, CVA, PVD   HISTORY:  Past Medical History:  Past Medical History:  Diagnosis Date  . Allergy   . Diabetes mellitus without complication ([place])   . Hypertension   . Kidney stones   . Sleep apnea    cpap   Past Surgical History:  Past Surgical History:  Procedure  Laterality Date  . ANKLE SURGERY  2003   right   . cracked chest  2007   equipment fell on chest and cracked his chest, had pain related to injury, cardiologist confirmed not related to heart.    . CRANIOTOMY Right 08/04/2018   Right frontal - cyst removal  . LITHOTRIPSY  2010  . nose fracture  2000    Social History:  reports that he has never smoked. He has never used smokeless tobacco. He reports that he does not drink alcohol or use drugs.  Family History: No family history of prostate ca.    HOME  MEDICATIONS: Allergies as of 10/23/2019   No Known Allergies     Medication List       Accurate as of Oct 23, 2019 12:56 PM. If you have any questions, ask your nurse or doctor.        atorvastatin 20 MG tablet Commonly known as: LIPITOR Take 1 tablet (20 mg total) by mouth daily.   Contour Next EZ w/Device Kit 1 Device by Does not apply route 2 (two) times daily.   Contour Next Test test strip Generic drug: glucose blood USE AS INSTRUCTED   HYDROcodone-acetaminophen 5-325 MG tablet Commonly known as: NORCO/VICODIN Take 1 tablet by mouth every 6 (six) hours as needed.   lisinopril-hydrochlorothiazide 20-25 MG tablet Commonly known as: ZESTORETIC TAKE 1 TABLET BY MOUTH EVERY DAY   Microlet Lancets Misc Use to check blood sugars once a day with Contour Next glucometer   minocycline 50 MG capsule Commonly known as: MINOCIN Take 50 mg by mouth 2 (two) times daily.   Testosterone 20.25 MG/ACT (1.62%) Gel 2 pumps on each arm daily         OBJECTIVE:   Vital Signs: There were no vitals taken for this visit.  Wt Readings from Last 3 Encounters:  08/03/19 270 lb (122.5 kg)  07/22/19 276 lb 12.8 oz (125.6 kg)  04/21/19 281 lb 12.8 oz (127.8 kg)     Exam: General: Pt appears well and is in NAD  Lungs: Clear with good BS bilat with no rales, rhonchi, or wheezes  Heart: RRR with normal S1 and S2 and no gallops; no murmurs; no rub  Abdomen: Normoactive bowel sounds, soft, nontender, without masses or organomegaly palpable  Extremities: 1+ pretibial edema. No tremor. Normal strength and motion throughout.  Skin: Normal texture and temperature to palpation. No rash noted.  Neuro: MS is good with appropriate affect, pt is alert and Ox3    DM foot exam: 04/21/2019 The skin of the feet is intact without sores or ulcerations.  The pedal pulses are 2+ on right and 2+ on left. The sensation is absent to a screening 5.07, 10 gram monofilament bilaterally at the toes.            DATA REVIEWED:  Lab Results  Component Value Date   HGBA1C 6.6 (H) 07/15/2019   HGBA1C 6.4 04/15/2019   HGBA1C 5.8 (A) 01/19/2019   Lab Results  Component Value Date   MICROALBUR 0.9 07/15/2019   LDLCALC 121 (H) 07/15/2019   CREATININE 0.92 10/14/2019    CT head (09/11/2018)   IMPRESSION: 1. Decreased pneumocephalus. Decreased extra-axial fluid, with decreased mass effect and midline shift. 2. Presumed drainage catheter is unchanged in appearance. 3. CSF density material extends into the extracranial soft tissues of the right side of the forehead. This is increased compared to the prior study although air which was present in this region on the  prior exam has resolved.     Results for ARAD, BURSTON (MRN [id number]) as of 10/23/2019 15:05  Ref. Range 10/14/2019 15:38  Sodium Latest Ref Range: 135 - 145 mEq/L 139  Potassium Latest Ref Range: 3.5 - 5.1 mEq/L 3.8  Chloride Latest Ref Range: 96 - 112 mEq/L 103  CO2 Latest Ref Range: 19 - 32 mEq/L 27  Glucose Latest Ref Range: 70 - 99 mg/dL 99  BUN Latest Ref Range: 6 - 23 mg/dL 11  Creatinine Latest Ref Range: 0.40 - 1.50 mg/dL 0.92  Calcium Latest Ref Range: 8.4 - 10.5 mg/dL 9.3  Alkaline Phosphatase Latest Ref Range: 39 - 117 U/L 61  Albumin Latest Ref Range: 3.5 - 5.2 g/dL 4.2  AST Latest Ref Range: 0 - 37 U/L 20  ALT Latest Ref Range: 0 - 53 U/L 18  Total Protein Latest Ref Range: 6.0 - 8.3 g/dL 6.8  Total Bilirubin Latest Ref Range: 0.2 - 1.2 mg/dL 0.5  GFR Latest Ref Range: >60.00 mL/min 88.90  WBC Latest Ref Range: 4.0 - 10.5 K/uL 10.4  RBC Latest Ref Range: 4.22 - 5.81 Mil/uL 5.00  Hemoglobin Latest Ref Range: 13.0 - 17.0 g/dL 14.5  HCT Latest Ref Range: 39.0 - 52.0 % 43.5  MCV Latest Ref Range: 78.0 - 100.0 fl 86.9  MCHC Latest Ref Range: 30.0 - 36.0 g/dL 33.4  RDW Latest Ref Range: 11.5 - 15.5 % 13.8  Platelets Latest Ref Range: 150.0 - 400.0 K/uL 310.0  Neutrophils Latest Ref Range: 43.0 - 77.0 % 58.3   Lymphocytes Latest Ref Range: 12.0 - 46.0 % 33.1  Monocytes Relative Latest Ref Range: 3.0 - 12.0 % 6.5  Eosinophil Latest Ref Range: 0.0 - 5.0 % 1.4  Basophil Latest Ref Range: 0.0 - 3.0 % 0.7  NEUT# Latest Ref Range: 1.4 - 7.7 K/uL 6.1  Lymphocyte # Latest Ref Range: 0.7 - 4.0 K/uL 3.4  Monocyte # Latest Ref Range: 0.1 - 1.0 K/uL 0.7  Eosinophils Absolute Latest Ref Range: 0.0 - 0.7 K/uL 0.1  Basophils Absolute Latest Ref Range: 0.0 - 0.1 K/uL 0.1  Testosterone, Total, LC-MS-MS Latest Ref Range: 250 - 1,100 ng/dL 583  PSA Latest Ref Range: 0.10 - 4.00 ng/mL 0.71   MRI 08/25/2019   Sella: The previously described 3 mm delayed enhancing area in the RIGHT pituitary gland is inconspicuous on today's examination. The sella, cavernous sinus, optic chasm and optic chasm are unremarkable. # Skull/marrow/soft tissues: Status post RIGHT frontal craniotomy. There is scalloping of the inner table and the RIGHT, due to a large arachnoid cyst. # Orbits: Unremarkable. # Sinuses/mastoid air cells: Unremarkable. # Vessels: Normal flow voids within the major intracranial vessels. # Brain: Again demonstrated is a large RIGHT middle cranial fossa arachnoid cyst. There is a septated portion in the anterior middle cranial fossa. There is tubing traveling along the lateral aspect of the larger more superior cyst. There is mass effect upon the adjacent structures. # 3. No evidence of pineal nodule is not well-visualized on today's examination. There is no intracranial hemorrhage. There is no midline shift. # Additional comments: None. ASSESSMENT / PLAN / RECOMMENDATIONS:   1)  Type 2 Diabetes Mellitus, Optimally Controlled with neuropathy - Most recent A1c of 5.6 %. Goal A1c < 7.0 %.   - His A1c continues to be optimal without medications - I have praised the pt on 15 lbs weight loss    EDUCATION / INSTRUCTIONS:  BG monitoring instructions: Patient is instructed to check  his blood sugars 3 times a  week, fasting .   Call [facility] if: BG persistently < 70 or > 300. . I reviewed the Rule of 15 for the treatment of hypoglycemia in detail with the patient. Literature supplied.   2) Pituitary Microadenoma:   - MRI 08/2019 pituitary microadenoma inconspicuous - Clinically and biochemicaly there's no evidence of hyper or hyposecretion    3) Hypogonadotrophic hypogonadism:    - Testosterone level is optimal - PSA and Hct normal   Medications Continue Androgel  4 pumps daily   4) Dyslipidemia :  - On atorvastatin 20 mg daily    F/U in 6 months    Signed electronically by: Mack Guise, MD  [facility]  [facility] [address] [place], [place] [address] Phone: [phone] FAX: [phone]   CC: Kristen Loader, [place] [address] Phone: [phone]  Fax: [phone]  Return to Endocrinology clinic as below: Future Appointments  Date Time Provider [place]  10/23/2019  3:40 PM Sahej Hauswirth, Melanie Crazier, MD [id number] None

## 2020-01-11 ENCOUNTER — Telehealth: Payer: Self-pay | Admitting: Internal Medicine

## 2020-01-11 NOTE — Telephone Encounter (Signed)
Patient requests to be called at ph# [phone] re: Patient has a question about using testosterone gel while going to the beach. Patient is going to the beach for a week beginning 01/16/20. If patient is unable to answer his phone, patient requests that a detailed message be left on voice mail.

## 2020-01-11 NOTE — Telephone Encounter (Signed)
Please advise 

## 2020-01-11 NOTE — Telephone Encounter (Signed)
Pt aware.

## 2020-02-09 ENCOUNTER — Telehealth: Payer: Self-pay

## 2020-02-09 NOTE — Telephone Encounter (Signed)
PA has been initiated via [email or web address] for Testosterone 1.62% gel.  Key: [id number] Status: Sent to [place] Drug: Testosterone 20.25 MG/ACT(1.62%) gel Form: Charity fundraiser PA Form (2017 NCPDP)   Your demographic data has been sent to [place] successfully!  [employer] typically takes 5-10 minutes to respond, but it may take a little longer in some cases. You will be notified by email when available. You can also check for an update later by opening this request from your dashboard. Please do not fax or call [employer] to resubmit this request. If you need assistance, please chat with CoverMyMeds or call us at [phone].  If it has been longer than 24 hours, please reach out to [place].

## 2020-02-15 NOTE — Telephone Encounter (Signed)
Have we heard back from CoverMyMeds regarding this PA? Patient's wife is calling stating she just spoke to the insurance and they said they are waiting for [place] to respond to them.  [phone] - pre authorization department.

## 2020-02-15 NOTE — Telephone Encounter (Signed)
Received fax from [place] stating that the PA for Testosterone 20.25mg /act (1.62%) TD gel.  Fax also states that it is unlikely that the pt will be approved for this because he may not meet criteria for approval. However, this will be sent to [facility] clinical reviewer for final determination.

## 2020-02-15 NOTE — Telephone Encounter (Signed)
PA has not been approved yet. Insurance wanted additional information, and this was provided today.

## 2020-02-16 NOTE — Telephone Encounter (Signed)
PA for Testosterone has been approved as of 02/15/20.  Key: [id number] - PA Case ID: [id number] Need help? Call us at [phone] Outcome Approvedon August 30 Your PA request has been approved. Additional information will be provided in the approval communication. (Message 1145) Drug Testosterone 20.25 MG/ACT(1.62%) gel Form [employer] Electronic PA Form [id number] NCPDP)

## 2020-04-22 ENCOUNTER — Other Ambulatory Visit: Payer: Self-pay

## 2020-04-22 ENCOUNTER — Other Ambulatory Visit: Payer: BC Managed Care – PPO

## 2020-04-22 ENCOUNTER — Other Ambulatory Visit (INDEPENDENT_AMBULATORY_CARE_PROVIDER_SITE_OTHER): Payer: BC Managed Care – PPO

## 2020-04-22 ENCOUNTER — Other Ambulatory Visit: Payer: Self-pay | Admitting: Internal Medicine

## 2020-04-22 DIAGNOSIS — E1142 Type 2 diabetes mellitus with diabetic polyneuropathy: Secondary | ICD-10-CM | POA: Diagnosis not present

## 2020-04-22 DIAGNOSIS — E291 Testicular hypofunction: Secondary | ICD-10-CM | POA: Diagnosis not present

## 2020-04-22 LAB — COMPREHENSIVE METABOLIC PANEL
ALT: 25 U/L (ref 0–53)
AST: 22 U/L (ref 0–37)
Albumin: 4.3 g/dL (ref 3.5–5.2)
Alkaline Phosphatase: 61 U/L (ref 39–117)
BUN: 13 mg/dL (ref 6–23)
CO2: 28 mEq/L (ref 19–32)
Calcium: 9.4 mg/dL (ref 8.4–10.5)
Chloride: 99 mEq/L (ref 96–112)
Creatinine, Ser: 0.81 mg/dL (ref 0.40–1.50)
GFR: 106.35 mL/min (ref >60.00)
Glucose, Bld: 92 mg/dL (ref 70–99)
Potassium: 4.2 mEq/L (ref 3.5–5.1)
Sodium: 137 mEq/L (ref 135–145)
Total Bilirubin: 0.8 mg/dL (ref 0.2–1.2)
Total Protein: 6.9 g/dL (ref 6.0–8.3)

## 2020-04-22 LAB — CBC
HCT: 47 % (ref 39.0–52.0)
Hemoglobin: 15.5 g/dL (ref 13.0–17.0)
MCHC: 33.1 g/dL (ref 30.0–36.0)
MCV: 86.1 fl (ref 78.0–100.0)
Platelets: 228 10*3/uL (ref 150.0–400.0)
RBC: 5.46 Mil/uL (ref 4.22–5.81)
RDW: 14.6 % (ref 11.5–15.5)
WBC: 7.8 10*3/uL (ref 4.0–10.5)

## 2020-04-22 LAB — LIPID PANEL
Cholesterol: 160 mg/dL (ref 0–200)
HDL: 38.7 mg/dL — ABNORMAL LOW (ref >39.00)
LDL Cholesterol: 100 mg/dL — ABNORMAL HIGH (ref 0–99)
NonHDL: 120.99
Total CHOL/HDL Ratio: 4
Triglycerides: 104 mg/dL (ref 0.0–149.0)
VLDL: 20.8 mg/dL (ref 0.0–40.0)

## 2020-04-22 LAB — TESTOSTERONE: Testosterone: 486.84 ng/dL (ref 300.00–890.00)

## 2020-04-22 LAB — PSA: PSA: 0.55 ng/mL (ref 0.10–4.00)

## 2020-04-22 NOTE — Telephone Encounter (Signed)
Last prescribed on 10/23/2019 #225g with 4 refills. Last OV on 10/23/2019. Next appointment on 04/29/2020

## 2020-04-29 ENCOUNTER — Encounter: Payer: Self-pay | Admitting: Internal Medicine

## 2020-04-29 ENCOUNTER — Ambulatory Visit (INDEPENDENT_AMBULATORY_CARE_PROVIDER_SITE_OTHER): Payer: BC Managed Care – PPO | Admitting: Internal Medicine

## 2020-04-29 ENCOUNTER — Other Ambulatory Visit: Payer: Self-pay

## 2020-04-29 VITALS — BP 120/72 | HR 72 | Ht 70.0 in | Wt 244.2 lb

## 2020-04-29 DIAGNOSIS — D352 Benign neoplasm of pituitary gland: Secondary | ICD-10-CM | POA: Diagnosis not present

## 2020-04-29 DIAGNOSIS — E291 Testicular hypofunction: Secondary | ICD-10-CM | POA: Diagnosis not present

## 2020-04-29 DIAGNOSIS — E785 Hyperlipidemia, unspecified: Secondary | ICD-10-CM | POA: Diagnosis not present

## 2020-04-29 DIAGNOSIS — E1142 Type 2 diabetes mellitus with diabetic polyneuropathy: Secondary | ICD-10-CM

## 2020-04-29 LAB — POCT GLYCOSYLATED HEMOGLOBIN (HGB A1C): Hemoglobin A1C: 5.9 % — AB (ref 4.0–5.6)

## 2020-04-29 LAB — POCT GLUCOSE (DEVICE FOR HOME USE): Glucose Fasting, POC: 128 mg/dL — AB (ref 70–99)

## 2020-04-29 MED ORDER — CONTOUR NEXT TEST VI STRP: 1.0000 | ORAL_STRIP | 11 refills | Status: DC

## 2020-04-29 NOTE — Progress Notes (Signed)
Name: Cody Sullivan  Age/ Sex: 44 y.o., male   MRN/ DOB: [id number], Apr 07, 1976     PCP: Kristen Loader, FNP   Reason for Endocrinology Evaluation: Type 2 Diabetes Mellitus  Initial Endocrine Consultative Visit: 04/10/18    PATIENT IDENTIFIER: Cody Sullivan is a 44 y.o. male with a past medical history of OSA on CPAP, dyslipidemia and obesity . The patient has followed with Endocrinology clinic since 04/10/18 for consultative assistance with management of his diabetes.  DIABETIC HISTORY:  Cody Sullivan was diagnosed with T2DM in 2019, her was offered Metformin at the time of his diagnosis by his PCP., but the patient opted for diet control and lifestyle changes. His hemoglobin A1c has ranged from 6.3%  in 10/2017, peaking at 6.5% in 03/2018 .   Low Testosterone:   He was found to have a low testosterone in 03/2018 ( T. Testosterone 126 ng/dL) with inappropriately normal LH/FSH. 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   He is S/P Right frontal craniotomy on 08/04/2018 through Dr. Alfred Sullivan   He has been tired since 2017, he has noted somewhat decrease in libido, no difficulty with erections. He has noted gynecomastia for a while, no pain or discharge from the nipples. He also noted a subjective decrease in testicular size ~ 01/2018. He has 2 biological kids. He has not noticed any decrease in frequency of shaving.  He has chronic occasional headaches.   No known FH  of prostate cancer.   Testosterone started 01/2019  SUBJECTIVE:   Today (04/29/2020): Cody Sullivan is here for a 3 month follow up on his diabetes management and hypogonadism. He checks his blood sugars 1 time daily, preprandial to breakfast. The patient has not had hypoglycemic episodes since the last clinic visit.  He is S/P right frontal craniotomy on 08/04/2018 for arachnoid cysts. Continues to have headaches but they  seem stable.     In term of his low testosterone he has no side effects to the androgel  He continues with weight loss      HOME ENDOCRINE REGIMEN:  Androgel 4 pumps daily  Atorvastatin 20 mg daily     METER DOWNLOAD SUMMARY: Did not bring      DIABETIC COMPLICATIONS: Microvascular complications:    Denies: Neuropathy, CKD or retinopathy   Last Eye Exam: Completed 2021   Macrovascular complications:    Denies: CAD, CVA, PVD   HISTORY:  Past Medical History:  Past Medical History:  Diagnosis Date  . Allergy   . Diabetes mellitus without complication ([place])   . Hypertension   . Kidney stones   . Sleep apnea    cpap   Past Surgical History:  Past Surgical History:  Procedure Laterality Date  . ANKLE SURGERY  2003   right   . cracked chest  2007   equipment fell on chest and cracked his chest, had pain related to injury, cardiologist confirmed not related to heart.    . CRANIOTOMY Right 08/04/2018   Right frontal - cyst removal  . LITHOTRIPSY  2010  . nose fracture  2000    Social History:  reports that he has never smoked. He has never used smokeless tobacco. He reports that he does not drink alcohol and does not use drugs.  Family History: No family history of prostate ca.    HOME MEDICATIONS: Allergies as of 04/29/2020  No Known Allergies     Medication List       Accurate as of April 29, 2020  3:47 PM. If you have any questions, ask your nurse or doctor.        atorvastatin 20 MG tablet Commonly known as: LIPITOR Take 1 tablet (20 mg total) by mouth daily.   Contour Next EZ w/Device Kit 1 Device by Does not apply route 2 (two) times daily.   Contour Next Test test strip Generic drug: glucose blood USE AS INSTRUCTED   HYDROcodone-acetaminophen 5-325 MG tablet Commonly known as: NORCO/VICODIN Take 1 tablet by mouth every 6 (six) hours as needed.   lisinopril-hydrochlorothiazide 20-25 MG tablet Commonly known as:  ZESTORETIC TAKE 1 TABLET BY MOUTH EVERY DAY   Microlet Lancets Misc Use to check blood sugars once a day with Contour Next glucometer   minocycline 50 MG capsule Commonly known as: MINOCIN Take 50 mg by mouth 2 (two) times daily.   Testosterone 20.25 MG/ACT (1.62%) Gel 2 PUMPS ON EACH ARM DAILY         OBJECTIVE:   Vital Signs: BP 120/72   Pulse 72   Ht $R'5\' 10"'QL$  (1.778 m)   Wt 244 lb 4 oz (110.8 kg)   SpO2 98%   BMI 35.05 kg/m   Wt Readings from Last 3 Encounters:  10/23/19 255 lb 3.2 oz (115.8 kg)  08/03/19 270 lb (122.5 kg)  07/22/19 276 lb 12.8 oz (125.6 kg)     Exam: General: Pt appears well and is in NAD  Lungs: Clear with good BS bilat with no rales, rhonchi, or wheezes  Heart: RRR with normal S1 and S2 and no gallops; no murmurs; no rub  Abdomen: Normoactive bowel sounds, soft, nontender, without masses or organomegaly palpable  Extremities: No pretibial edema.  Skin: Normal texture and temperature to palpation. No rash noted.  Neuro: MS is good with appropriate affect, pt is alert and Ox3    DM foot exam: 05/01/2020 The skin of the feet is intact without sores or ulcerations.  The pedal pulses are 2+ on right and 2+ on left. The sensation is absent to a screening 5.07, 10 gram monofilament bilaterally at the toes.           DATA REVIEWED:  Results for RHONIN, TROTT (MRN [id number]) as of 04/29/2020 15:45  Ref. Range 04/22/2020 08:56  Sodium Latest Ref Range: 135 - 145 mEq/L 137  Potassium Latest Ref Range: 3.5 - 5.1 mEq/L 4.2  Chloride Latest Ref Range: 96 - 112 mEq/L 99  CO2 Latest Ref Range: 19 - 32 mEq/L 28  Glucose Latest Ref Range: 70 - 99 mg/dL 92  BUN Latest Ref Range: 6 - 23 mg/dL 13  Creatinine Latest Ref Range: 0.40 - 1.50 mg/dL 0.81  Calcium Latest Ref Range: 8.4 - 10.5 mg/dL 9.4  Alkaline Phosphatase Latest Ref Range: 39 - 117 U/L 61  Albumin Latest Ref Range: 3.5 - 5.2 g/dL 4.3  AST Latest Ref Range: 0 - 37 U/L 22  ALT Latest Ref  Range: 0 - 53 U/L 25  Total Protein Latest Ref Range: 6.0 - 8.3 g/dL 6.9  Total Bilirubin Latest Ref Range: 0.2 - 1.2 mg/dL 0.8  GFR Latest Ref Range: >60.00 mL/min 106.35  Total CHOL/HDL Ratio Unknown 4  Cholesterol Latest Ref Range: 0 - 200 mg/dL 160  HDL Cholesterol Latest Ref Range: >39.00 mg/dL 38.70 (L)  LDL (calc) Latest Ref Range: 0 - 99 mg/dL 100 (H)  NonHDL  Unknown 120.99  Triglycerides Latest Ref Range: 0 - 149 mg/dL 104.0  VLDL Latest Ref Range: 0.0 - 40.0 mg/dL 20.8  WBC Latest Ref Range: 4.0 - 10.5 K/uL 7.8  RBC Latest Ref Range: 4.22 - 5.81 Mil/uL 5.46  Hemoglobin Latest Ref Range: 13.0 - 17.0 g/dL 15.5  HCT Latest Ref Range: 39 - 52 % 47.0  MCV Latest Ref Range: 78.0 - 100.0 fl 86.1  MCHC Latest Ref Range: 30.0 - 36.0 g/dL 33.1  RDW Latest Ref Range: 11.5 - 15.5 % 14.6  Platelets Latest Ref Range: 150 - 400 K/uL 228.0  Testosterone Latest Ref Range: 300.00 - 890.00 ng/dL 486.84  PSA Latest Ref Range: 0.10 - 4.00 ng/mL 0.55     Lab Results  Component Value Date   HGBA1C 5.6 10/23/2019   HGBA1C 6.6 (H) 07/15/2019   HGBA1C 6.4 04/15/2019   Lab Results  Component Value Date   MICROALBUR 0.9 07/15/2019   LDLCALC 100 (H) 04/22/2020   CREATININE 0.81 04/22/2020    MRI 08/25/2019  Sella: The previously described 3 mm delayed enhancing area in the RIGHT pituitary gland is inconspicuous on today's examination. The sella, cavernous sinus, optic chasm and optic chasm are unremarkable. # Skull/marrow/soft tissues: Status post RIGHT frontal craniotomy. There is scalloping of the inner table and the RIGHT, due to a large arachnoid cyst. # Orbits: Unremarkable. # Sinuses/mastoid air cells: Unremarkable. # Vessels: Normal flow voids within the major intracranial vessels. # Brain: Again demonstrated is a large RIGHT middle cranial fossa arachnoid cyst. There is a septated portion in the anterior middle cranial fossa. There is tubing traveling along the lateral aspect of  the larger more superior cyst. There is mass effect upon the adjacent structures. # 3. No evidence of pineal nodule is not well-visualized on today's examination. There is no intracranial hemorrhage. There is no midline shift. # Additional comments: None. ASSESSMENT / PLAN / RECOMMENDATIONS:   1)  Type 2 Diabetes Mellitus, in remission with neuropathy - Most recent A1c of 5.9 %. Goal A1c < 7.0 %.   - I have praised the pt on  continuous weight loss    EDUCATION / INSTRUCTIONS:  BG monitoring instructions: Patient is instructed to check his blood sugars 2 times a week, fasting .  2) Pituitary Microadenoma:   - MRI 08/2019 pituitary microadenoma inconspicuous - Clinically and biochemicaly there's no evidence of hyper or hyposecretion    3) Hypogonadotrophic hypogonadism:    - Testosterone level is optimal - PSA and Hct normal   Medications Continue Androgel  4 pumps daily   4) Dyslipidemia :  - LDL is tredning down but continues to be above goal..  - He has been taking Atorvastatin  In the morning, I have advised him to start taking it at bedtime. If this remains elevated by next visit, will increase to 40 mg    Medication Atorvastatin 20 mg QHS   F/U in 6 months    Signed electronically by: Mack Guise, MD  [facility] Endocrin[facility]  [facility] [address] [place], [place] [address] Phone: [phone] FAX: [phone]   CC: Kristen Loader, [place] [address] Phone: [phone]  Fax: [phone]  Return to Endocrinology clinic as below: No future appointments.

## 2020-04-29 NOTE — Patient Instructions (Addendum)
-   Continue  Androgel ( testosterone ) at FOUR pumps daily  - Continue Atorvastatin 20 mg daily at bedtime      

## 2020-07-18 ENCOUNTER — Telehealth: Payer: Self-pay | Admitting: Internal Medicine

## 2020-07-18 ENCOUNTER — Inpatient Hospital Stay (HOSPITAL_COMMUNITY): Payer: BC Managed Care – PPO | Admitting: Certified Registered"

## 2020-07-18 ENCOUNTER — Emergency Department (HOSPITAL_BASED_OUTPATIENT_CLINIC_OR_DEPARTMENT_OTHER): Payer: BC Managed Care – PPO

## 2020-07-18 ENCOUNTER — Inpatient Hospital Stay (HOSPITAL_BASED_OUTPATIENT_CLINIC_OR_DEPARTMENT_OTHER)
Admission: EM | Admit: 2020-07-18 | Discharge: 2020-07-20 | DRG: 854 | Disposition: A | Discharge status: Home / Self Care | Payer: BC Managed Care – PPO | Attending: Internal Medicine | Admitting: Internal Medicine

## 2020-07-18 ENCOUNTER — Encounter (HOSPITAL_COMMUNITY)
Admission: EM | Disposition: A | Discharge status: Home / Self Care | Payer: Self-pay | Source: Home / Self Care | Attending: Internal Medicine

## 2020-07-18 ENCOUNTER — Encounter (HOSPITAL_BASED_OUTPATIENT_CLINIC_OR_DEPARTMENT_OTHER): Payer: Self-pay | Admitting: Emergency Medicine

## 2020-07-18 ENCOUNTER — Other Ambulatory Visit: Payer: Self-pay

## 2020-07-18 DIAGNOSIS — K612 Anorectal abscess: Secondary | ICD-10-CM | POA: Diagnosis present

## 2020-07-18 DIAGNOSIS — G4733 Obstructive sleep apnea (adult) (pediatric): Secondary | ICD-10-CM | POA: Diagnosis present

## 2020-07-18 DIAGNOSIS — E782 Mixed hyperlipidemia: Secondary | ICD-10-CM | POA: Diagnosis not present

## 2020-07-18 DIAGNOSIS — E669 Obesity, unspecified: Secondary | ICD-10-CM | POA: Diagnosis present

## 2020-07-18 DIAGNOSIS — Z6839 Body mass index (BMI) 39.0-39.9, adult: Secondary | ICD-10-CM

## 2020-07-18 DIAGNOSIS — I10 Essential (primary) hypertension: Secondary | ICD-10-CM | POA: Diagnosis present

## 2020-07-18 DIAGNOSIS — R6520 Severe sepsis without septic shock: Secondary | ICD-10-CM | POA: Diagnosis present

## 2020-07-18 DIAGNOSIS — K611 Rectal abscess: Secondary | ICD-10-CM

## 2020-07-18 DIAGNOSIS — A4151 Sepsis due to Escherichia coli [E. coli]: Secondary | ICD-10-CM | POA: Diagnosis present

## 2020-07-18 DIAGNOSIS — E1142 Type 2 diabetes mellitus with diabetic polyneuropathy: Secondary | ICD-10-CM | POA: Diagnosis present

## 2020-07-18 DIAGNOSIS — A419 Sepsis, unspecified organism: Secondary | ICD-10-CM | POA: Diagnosis not present

## 2020-07-18 DIAGNOSIS — Z20822 Contact with and (suspected) exposure to covid-19: Secondary | ICD-10-CM | POA: Diagnosis present

## 2020-07-18 DIAGNOSIS — Z800 Family history of malignant neoplasm of digestive organs: Secondary | ICD-10-CM | POA: Diagnosis not present

## 2020-07-18 DIAGNOSIS — E785 Hyperlipidemia, unspecified: Secondary | ICD-10-CM | POA: Diagnosis present

## 2020-07-18 DIAGNOSIS — E291 Testicular hypofunction: Secondary | ICD-10-CM | POA: Diagnosis present

## 2020-07-18 HISTORY — PX: INCISION AND DRAINAGE PERIRECTAL ABSCESS: SHX1804

## 2020-07-18 LAB — PROTIME-INR
INR: 1 (ref 0.8–1.2)
Prothrombin Time: 12.9 seconds (ref 11.4–15.2)

## 2020-07-18 LAB — CBC WITH DIFFERENTIAL/PLATELET
Abs Immature Granulocytes: 0.06 10*3/uL (ref 0.00–0.07)
Abs Immature Granulocytes: 0.06 10*3/uL (ref 0.00–0.07)
Basophils Absolute: 0.1 10*3/uL (ref 0.0–0.1)
Basophils Absolute: 0 10*3/uL (ref 0.0–0.1)
Basophils Relative: 0 %
Basophils Relative: 0 %
Eosinophils Absolute: 0.1 10*3/uL (ref 0.0–0.5)
Eosinophils Absolute: 0.1 10*3/uL (ref 0.0–0.5)
Eosinophils Relative: 0 %
Eosinophils Relative: 0 %
HCT: 46.1 % (ref 39.0–52.0)
HCT: 40.7 % (ref 39.0–52.0)
Hemoglobin: 15.6 g/dL (ref 13.0–17.0)
Hemoglobin: 13.3 g/dL (ref 13.0–17.0)
Immature Granulocytes: 0 %
Immature Granulocytes: 1 %
Lymphocytes Relative: 13 %
Lymphocytes Relative: 17 %
Lymphs Abs: 2.2 10*3/uL (ref 0.7–4.0)
Lymphs Abs: 2.2 10*3/uL (ref 0.7–4.0)
MCH: 29.5 pg (ref 26.0–34.0)
MCH: 29.6 pg (ref 26.0–34.0)
MCHC: 33.8 g/dL (ref 30.0–36.0)
MCHC: 32.7 g/dL (ref 30.0–36.0)
MCV: 87.3 fL (ref 80.0–100.0)
MCV: 90.6 fL (ref 80.0–100.0)
Monocytes Absolute: 1.6 10*3/uL — ABNORMAL HIGH (ref 0.1–1.0)
Monocytes Absolute: 1 10*3/uL (ref 0.1–1.0)
Monocytes Relative: 9 %
Monocytes Relative: 8 %
Neutro Abs: 13.5 10*3/uL — ABNORMAL HIGH (ref 1.7–7.7)
Neutro Abs: 9.3 10*3/uL — ABNORMAL HIGH (ref 1.7–7.7)
Neutrophils Relative %: 78 %
Neutrophils Relative %: 74 %
Platelets: 275 10*3/uL (ref 150–400)
Platelets: 183 10*3/uL (ref 150–400)
RBC: 5.28 MIL/uL (ref 4.22–5.81)
RBC: 4.49 MIL/uL (ref 4.22–5.81)
RDW: 13.7 % (ref 11.5–15.5)
RDW: 13.9 % (ref 11.5–15.5)
WBC: 17.6 10*3/uL — ABNORMAL HIGH (ref 4.0–10.5)
WBC: 12.6 10*3/uL — ABNORMAL HIGH (ref 4.0–10.5)
nRBC: 0 % (ref 0.0–0.2)
nRBC: 0 % (ref 0.0–0.2)

## 2020-07-18 LAB — COMPREHENSIVE METABOLIC PANEL
ALT: 22 U/L (ref 0–44)
ALT: 35 U/L (ref 0–44)
AST: 27 U/L (ref 15–41)
AST: 34 U/L (ref 15–41)
Albumin: 4.1 g/dL (ref 3.5–5.0)
Albumin: 3.9 g/dL (ref 3.5–5.0)
Alkaline Phosphatase: 51 U/L (ref 38–126)
Alkaline Phosphatase: 51 U/L (ref 38–126)
Anion gap: 12 (ref 5–15)
Anion gap: 9 (ref 5–15)
BUN: 18 mg/dL (ref 6–20)
BUN: 12 mg/dL (ref 6–20)
CO2: 22 mmol/L (ref 22–32)
CO2: 27 mmol/L (ref 22–32)
Calcium: 9.2 mg/dL (ref 8.9–10.3)
Calcium: 8.7 mg/dL — ABNORMAL LOW (ref 8.9–10.3)
Chloride: 102 mmol/L (ref 98–111)
Chloride: 103 mmol/L (ref 98–111)
Creatinine, Ser: 1.17 mg/dL (ref 0.61–1.24)
Creatinine, Ser: 0.95 mg/dL (ref 0.61–1.24)
GFR, Estimated: >60 mL/min (ref >60)
GFR, Estimated: >60 mL/min (ref >60)
Glucose, Bld: 124 mg/dL — ABNORMAL HIGH (ref 70–99)
Glucose, Bld: 87 mg/dL (ref 70–99)
Potassium: 3.7 mmol/L (ref 3.5–5.1)
Potassium: 3.9 mmol/L (ref 3.5–5.1)
Sodium: 136 mmol/L (ref 135–145)
Sodium: 139 mmol/L (ref 135–145)
Total Bilirubin: 0.5 mg/dL (ref 0.3–1.2)
Total Bilirubin: 1.1 mg/dL (ref 0.3–1.2)
Total Protein: 7.1 g/dL (ref 6.5–8.1)
Total Protein: 6.7 g/dL (ref 6.5–8.1)

## 2020-07-18 LAB — URINALYSIS, ROUTINE W REFLEX MICROSCOPIC
Bilirubin Urine: NEGATIVE
Glucose, UA: NEGATIVE mg/dL
Hgb urine dipstick: NEGATIVE
Ketones, ur: NEGATIVE mg/dL
Leukocytes,Ua: NEGATIVE
Nitrite: NEGATIVE
Protein, ur: NEGATIVE mg/dL
Specific Gravity, Urine: 1.005 (ref 1.005–1.030)
pH: 5 (ref 5.0–8.0)

## 2020-07-18 LAB — LACTIC ACID, PLASMA
Lactic Acid, Venous: 2.8 mmol/L (ref 0.5–1.9)
Lactic Acid, Venous: 2.8 mmol/L (ref 0.5–1.9)
Lactic Acid, Venous: 2.7 mmol/L (ref 0.5–1.9)

## 2020-07-18 LAB — SARS CORONAVIRUS 2 BY RT PCR (HOSPITAL ORDER, PERFORMED IN ~~LOC~~ HOSPITAL LAB): SARS Coronavirus 2: NEGATIVE

## 2020-07-18 LAB — PROCALCITONIN: Procalcitonin: <0.1 ng/mL

## 2020-07-18 LAB — APTT: aPTT: 30 seconds (ref 24–36)

## 2020-07-18 LAB — BRAIN NATRIURETIC PEPTIDE: B Natriuretic Peptide: 73.8 pg/mL (ref 0.0–100.0)

## 2020-07-18 LAB — TSH: TSH: 1.431 u[IU]/mL (ref 0.350–4.500)

## 2020-07-18 LAB — MRSA PCR SCREENING: MRSA by PCR: NEGATIVE

## 2020-07-18 LAB — GLUCOSE, CAPILLARY
Glucose-Capillary: 77 mg/dL (ref 70–99)
Glucose-Capillary: 161 mg/dL — ABNORMAL HIGH (ref 70–99)

## 2020-07-18 LAB — CBG MONITORING, ED: Glucose-Capillary: 59 mg/dL — ABNORMAL LOW (ref 70–99)

## 2020-07-18 SURGERY — INCISION AND DRAINAGE, ABSCESS, PERIRECTAL
Anesthesia: General

## 2020-07-18 MED ORDER — LIDOCAINE 2% (20 MG/ML) 5 ML SYRINGE
INTRAMUSCULAR | Status: DC | PRN
  Administered 2020-07-18: 80 mg via INTRAVENOUS

## 2020-07-18 MED ORDER — HYDROMORPHONE HCL 1 MG/ML IJ SOLN
INTRAMUSCULAR | Status: AC
  Filled 2020-07-18: qty 1

## 2020-07-18 MED ORDER — LACTATED RINGERS IV SOLN: INTRAVENOUS | Status: DC

## 2020-07-18 MED ORDER — ACETAMINOPHEN 325 MG PO TABS
650.0000 mg | ORAL_TABLET | Freq: Four times a day (QID) | ORAL | Status: DC | PRN
  Administered 2020-07-18: 650 mg via ORAL
  Filled 2020-07-18 (×2): qty 2

## 2020-07-18 MED ORDER — MORPHINE SULFATE (PF) 4 MG/ML IV SOLN
4.0000 mg | Freq: Once | INTRAVENOUS | Status: AC
  Administered 2020-07-18: 4 mg via INTRAVENOUS
  Filled 2020-07-18: qty 1

## 2020-07-18 MED ORDER — FENTANYL CITRATE (PF) 100 MCG/2ML IJ SOLN
INTRAMUSCULAR | Status: DC | PRN
  Administered 2020-07-18 (×2): 50 ug via INTRAVENOUS
  Administered 2020-07-18: 100 ug via INTRAVENOUS
  Administered 2020-07-18: 50 ug via INTRAVENOUS

## 2020-07-18 MED ORDER — ONDANSETRON HCL 4 MG PO TABS: 4.0000 mg | ORAL_TABLET | Freq: Four times a day (QID) | ORAL | Status: DC | PRN

## 2020-07-18 MED ORDER — HYDROMORPHONE HCL 1 MG/ML IJ SOLN
0.2500 mg | INTRAMUSCULAR | Status: DC | PRN
  Administered 2020-07-18: 0.5 mg via INTRAVENOUS

## 2020-07-18 MED ORDER — DEXTROSE 50 % IV SOLN
12.5000 g | INTRAVENOUS | Status: AC
  Administered 2020-07-18: 12.5 g via INTRAVENOUS

## 2020-07-18 MED ORDER — MORPHINE SULFATE (PF) 2 MG/ML IV SOLN
2.0000 mg | INTRAVENOUS | Status: DC | PRN
  Administered 2020-07-18: 2 mg via INTRAVENOUS
  Filled 2020-07-18: qty 1

## 2020-07-18 MED ORDER — PHENYLEPHRINE 40 MCG/ML (10ML) SYRINGE FOR IV PUSH (FOR BLOOD PRESSURE SUPPORT)
PREFILLED_SYRINGE | INTRAVENOUS | Status: DC | PRN
  Administered 2020-07-18: 160 ug via INTRAVENOUS
  Administered 2020-07-18: 80 ug via INTRAVENOUS

## 2020-07-18 MED ORDER — TESTOSTERONE 50 MG/5GM (1%) TD GEL: 4.0000 g | Freq: Every day | TRANSDERMAL | Status: DC

## 2020-07-18 MED ORDER — LACTATED RINGERS IV BOLUS (SEPSIS)
400.0000 mL | Freq: Once | INTRAVENOUS | Status: AC
  Administered 2020-07-18: 400 mL via INTRAVENOUS

## 2020-07-18 MED ORDER — VANCOMYCIN HCL IN DEXTROSE 1-5 GM/200ML-% IV SOLN
1000.0000 mg | Freq: Two times a day (BID) | INTRAVENOUS | Status: DC
  Administered 2020-07-18 – 2020-07-20 (×4): 1000 mg via INTRAVENOUS
  Filled 2020-07-18 (×5): qty 200

## 2020-07-18 MED ORDER — LACTATED RINGERS IV BOLUS (SEPSIS)
1000.0000 mL | Freq: Once | INTRAVENOUS | Status: AC
  Administered 2020-07-18: 1000 mL via INTRAVENOUS

## 2020-07-18 MED ORDER — FENTANYL CITRATE (PF) 250 MCG/5ML IJ SOLN
INTRAMUSCULAR | Status: AC
  Filled 2020-07-18: qty 5

## 2020-07-18 MED ORDER — METHOCARBAMOL 1000 MG/10ML IJ SOLN
500.0000 mg | Freq: Four times a day (QID) | INTRAVENOUS | Status: DC | PRN
  Filled 2020-07-18: qty 5

## 2020-07-18 MED ORDER — PIPERACILLIN-TAZOBACTAM 3.375 G IVPB 30 MIN
3.3750 g | Freq: Once | INTRAVENOUS | Status: AC
  Administered 2020-07-18: 3.375 g via INTRAVENOUS
  Filled 2020-07-18: qty 50

## 2020-07-18 MED ORDER — CHLORHEXIDINE GLUCONATE CLOTH 2 % EX PADS
6.0000 | MEDICATED_PAD | Freq: Every day | CUTANEOUS | Status: DC
  Administered 2020-07-18: 6 via TOPICAL

## 2020-07-18 MED ORDER — VANCOMYCIN HCL IN DEXTROSE 1-5 GM/200ML-% IV SOLN
1000.0000 mg | Freq: Once | INTRAVENOUS | Status: AC
  Administered 2020-07-18: 1000 mg via INTRAVENOUS
  Filled 2020-07-18: qty 200

## 2020-07-18 MED ORDER — IOHEXOL 300 MG/ML  SOLN
100.0000 mL | Freq: Once | INTRAMUSCULAR | Status: AC | PRN
  Administered 2020-07-18: 100 mL via INTRAVENOUS

## 2020-07-18 MED ORDER — LACTATED RINGERS IV BOLUS
1000.0000 mL | Freq: Once | INTRAVENOUS | Status: AC
  Administered 2020-07-18: 1000 mL via INTRAVENOUS

## 2020-07-18 MED ORDER — DEXAMETHASONE SODIUM PHOSPHATE 10 MG/ML IJ SOLN
INTRAMUSCULAR | Status: DC | PRN
  Administered 2020-07-18: 10 mg via INTRAVENOUS

## 2020-07-18 MED ORDER — LACTATED RINGERS IV SOLN: INTRAVENOUS | Status: DC | PRN

## 2020-07-18 MED ORDER — INSULIN ASPART 100 UNIT/ML ~~LOC~~ SOLN
0.0000 [IU] | SUBCUTANEOUS | Status: DC
  Administered 2020-07-19: 3 [IU] via SUBCUTANEOUS
  Administered 2020-07-19: 2 [IU] via SUBCUTANEOUS
  Administered 2020-07-19: 3 [IU] via SUBCUTANEOUS
  Filled 2020-07-18: qty 0.15

## 2020-07-18 MED ORDER — MIDAZOLAM HCL 2 MG/2ML IJ SOLN
INTRAMUSCULAR | Status: AC
  Filled 2020-07-18: qty 2

## 2020-07-18 MED ORDER — TESTOSTERONE 50 MG/5GM (1%) TD GEL
5.0000 g | Freq: Every day | TRANSDERMAL | Status: DC
  Administered 2020-07-19: 5 g via TRANSDERMAL
  Filled 2020-07-18: qty 5

## 2020-07-18 MED ORDER — DEXMEDETOMIDINE (PRECEDEX) IN NS 20 MCG/5ML (4 MCG/ML) IV SYRINGE
PREFILLED_SYRINGE | INTRAVENOUS | Status: DC | PRN
  Administered 2020-07-18: 20 ug via INTRAVENOUS

## 2020-07-18 MED ORDER — DIPHENHYDRAMINE HCL 50 MG/ML IJ SOLN: 25.0000 mg | Freq: Four times a day (QID) | INTRAMUSCULAR | Status: DC | PRN

## 2020-07-18 MED ORDER — DIPHENHYDRAMINE HCL 50 MG/ML IJ SOLN
INTRAMUSCULAR | Status: AC
  Filled 2020-07-18: qty 1

## 2020-07-18 MED ORDER — PROPOFOL 10 MG/ML IV BOLUS
INTRAVENOUS | Status: AC
  Filled 2020-07-18: qty 20

## 2020-07-18 MED ORDER — DIPHENHYDRAMINE HCL 50 MG/ML IJ SOLN
12.5000 mg | Freq: Once | INTRAMUSCULAR | Status: AC
  Administered 2020-07-18: 12.5 mg via INTRAVENOUS

## 2020-07-18 MED ORDER — METRONIDAZOLE IN NACL 5-0.79 MG/ML-% IV SOLN
500.0000 mg | Freq: Three times a day (TID) | INTRAVENOUS | Status: DC
  Administered 2020-07-18 – 2020-07-20 (×5): 500 mg via INTRAVENOUS
  Filled 2020-07-18 (×5): qty 100

## 2020-07-18 MED ORDER — ONDANSETRON HCL 4 MG/2ML IJ SOLN: 4.0000 mg | Freq: Four times a day (QID) | INTRAMUSCULAR | Status: DC | PRN

## 2020-07-18 MED ORDER — SODIUM CHLORIDE 0.9 % IV SOLN: 2.0000 g | Freq: Once | INTRAVENOUS | Status: DC

## 2020-07-18 MED ORDER — DEXAMETHASONE SODIUM PHOSPHATE 10 MG/ML IJ SOLN
INTRAMUSCULAR | Status: AC
  Filled 2020-07-18: qty 1

## 2020-07-18 MED ORDER — OXYCODONE HCL 5 MG PO TABS
5.0000 mg | ORAL_TABLET | ORAL | Status: DC | PRN
  Administered 2020-07-18: 5 mg via ORAL
  Filled 2020-07-18: qty 1

## 2020-07-18 MED ORDER — MIDAZOLAM HCL 5 MG/5ML IJ SOLN
INTRAMUSCULAR | Status: DC | PRN
  Administered 2020-07-18: 2 mg via INTRAVENOUS

## 2020-07-18 MED ORDER — ONDANSETRON HCL 4 MG/2ML IJ SOLN
4.0000 mg | Freq: Once | INTRAMUSCULAR | Status: AC
  Administered 2020-07-18: 4 mg via INTRAVENOUS
  Filled 2020-07-18: qty 2

## 2020-07-18 MED ORDER — PROPOFOL 10 MG/ML IV BOLUS
INTRAVENOUS | Status: DC | PRN
  Administered 2020-07-18: 200 mg via INTRAVENOUS

## 2020-07-18 MED ORDER — ACETAMINOPHEN 650 MG RE SUPP: 650.0000 mg | Freq: Four times a day (QID) | RECTAL | Status: DC | PRN

## 2020-07-18 MED ORDER — ONDANSETRON HCL 4 MG/2ML IJ SOLN
INTRAMUSCULAR | Status: AC
  Filled 2020-07-18: qty 2

## 2020-07-18 MED ORDER — SODIUM CHLORIDE 0.9 % IV SOLN
INTRAVENOUS | Status: DC | PRN
  Administered 2020-07-18: 500 mL via INTRAVENOUS

## 2020-07-18 MED ORDER — SODIUM CHLORIDE 0.9% FLUSH
3.0000 mL | Freq: Two times a day (BID) | INTRAVENOUS | Status: DC
  Administered 2020-07-18 – 2020-07-19 (×4): 3 mL via INTRAVENOUS

## 2020-07-18 MED ORDER — SODIUM CHLORIDE 0.9 % IV SOLN: Freq: Once | INTRAVENOUS | Status: AC

## 2020-07-18 MED ORDER — ONDANSETRON HCL 4 MG/2ML IJ SOLN
INTRAMUSCULAR | Status: DC | PRN
  Administered 2020-07-18: 4 mg via INTRAVENOUS

## 2020-07-18 MED ORDER — SODIUM CHLORIDE 0.9 % IV SOLN
2.0000 g | Freq: Three times a day (TID) | INTRAVENOUS | Status: DC
  Administered 2020-07-18 – 2020-07-20 (×6): 2 g via INTRAVENOUS
  Filled 2020-07-18 (×8): qty 2

## 2020-07-18 SURGICAL SUPPLY — 19 items
BLADE SURG SZ20 CARB STEEL (BLADE) ×2 IMPLANT
COVER SURGICAL LIGHT HANDLE (MISCELLANEOUS) ×2 IMPLANT
COVER WAND RF STERILE (DRAPES) IMPLANT
DRAPE SHEET LG 3/4 BI-LAMINATE (DRAPES) ×2 IMPLANT
DRSG PAD ABDOMINAL 8X10 ST (GAUZE/BANDAGES/DRESSINGS) ×1 IMPLANT
GAUZE 4X4 16PLY RFD (DISPOSABLE) ×2 IMPLANT
GAUZE PACKING IODOFORM 1X5 (PACKING) ×1 IMPLANT
GAUZE SPONGE 4X4 12PLY STRL (GAUZE/BANDAGES/DRESSINGS) ×2 IMPLANT
GLOVE BIOGEL M 8.0 STRL (GLOVE) ×2 IMPLANT
GLOVE SURG UNDER POLY LF SZ7 (GLOVE) ×2 IMPLANT
GOWN SPEC L4 XLG W/TWL (GOWN DISPOSABLE) ×2 IMPLANT
GOWN STRL REUS W/TWL LRG LVL3 (GOWN DISPOSABLE) ×2 IMPLANT
GOWN STRL REUS W/TWL XL LVL3 (GOWN DISPOSABLE) ×6 IMPLANT
KIT BASIN OR (CUSTOM PROCEDURE TRAY) ×2 IMPLANT
KIT TURNOVER KIT A (KITS) IMPLANT
PACK LITHOTOMY IV (CUSTOM PROCEDURE TRAY) ×2 IMPLANT
PENCIL SMOKE EVACUATOR (MISCELLANEOUS) IMPLANT
SWAB CULTURE ESWAB REG 1ML (MISCELLANEOUS) ×2 IMPLANT
TOWEL OR 17X26 10 PK STRL BLUE (TOWEL DISPOSABLE) ×2 IMPLANT

## 2020-07-18 NOTE — Transfer of Care (Signed)
Immediate Anesthesia Transfer of Care Note  Patient: [place]  Procedure(s) Performed: IRRIGATION AND DEBRIDEMENT PERIRECTAL ABSCESS (N/A )  Patient Location: PACU  Anesthesia Type:General  Level of Consciousness: awake, alert  and oriented  Airway & Oxygen Therapy: Patient Spontanous Breathing and Patient connected to face mask  Post-op Assessment: Report given to RN and Post -op Vital signs reviewed and stable  Post vital signs: Reviewed and stable  Last Vitals:  Vitals Value Taken Time  BP    Temp    Pulse 79 07/18/20 1450  Resp 17 07/18/20 1450  SpO2 98 % 07/18/20 1450  Vitals shown include unvalidated device data.  Last Pain:  Vitals:   07/18/20 1350  TempSrc: Oral  PainSc:          Complications: No complications documented.

## 2020-07-18 NOTE — ED Notes (Signed)
Blood cultures collected before starting flagyl.

## 2020-07-18 NOTE — Assessment & Plan Note (Addendum)
-   WBC 17.6, HR 113, lactic acid 2.8, source considered perianal abscess - s/p vanc/zosyn and LR 2.4 L bolus followed by maintenance rate - continue on vanc/cefepime/flagyl - continue LR at 125 - trend PCT - surgery following; keep NPO until seen - continue morphine for pain control; zofran for nausea - repeat and trend lactic acid on arrival to [facility]; if still elevated will re-bolus - if MAP<65 and refractory to IVF will warrant vasopressor support  - follow up am cortisol

## 2020-07-18 NOTE — Assessment & Plan Note (Signed)
-  Currently hypotensive in setting of severe sepsis -Hold home medications and resume when able

## 2020-07-18 NOTE — ED Notes (Signed)
Call to [facility] ED charge, spoke to [facility] to update, assigned rm [phone] for carelink.

## 2020-07-18 NOTE — Telephone Encounter (Signed)
ED called regarding pt. They had a question regarding pt's medication and if he would need "stress doses". Please call Dwyane Dee at [phone]

## 2020-07-18 NOTE — ED Provider Notes (Addendum)
[facility] Provider Note   CSN: [id number] Arrival date & time: 07/18/20  0126     History Chief Complaint  Patient presents with  . Rectal Pain    [facility] is a 45 y.o. male.  HPI     This a 45 year old male with a history of hypertension and diabetes who presents with back pain, rectal pain, and fever.  Patient reports he began to not feel well on Friday.  However, on Sunday morning he began to feel worsening rectal discomfort.  It is worse when he sits and stands.  He describes burning with bowel movements.  He states that his bowel movements are soft.  He has a history of a perianal abscess in the past with similar pain.  Rates his pain at 10 out of 10 and he cannot get comfortable.  He reports fever of 102.2 yesterday evening.  He took Tylenol.  He has not had any upper respiratory symptoms including cough, chest pain, nausea, vomiting, abdominal pain.  Denies hematuria or dysuria.  Past Medical History:  Diagnosis Date  . Allergy   . Diabetes mellitus without complication ([place])   . Hypertension   . Kidney stones   . Sleep apnea    cpap    Patient Active Problem List   Diagnosis Date Noted  . Dyslipidemia 07/23/2019  . Type 2 diabetes mellitus with diabetic polyneuropathy, without long-term current use of insulin ([place]) 04/21/2019  . 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 Hypogonadism in male 12/26/2018  . Type 2 diabetes mellitus without complication, without long-term current use of insulin ([place]) 07/22/2018  . Low testosterone 07/22/2018  . Pituitary microadenoma ([place]) 07/22/2018  . 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 Essential hypertension 06/04/2018  . HTN, goal below 130/80 02/25/2013  . Hyperlipidemia 02/25/2013  . Poison ivy dermatitis 11/19/2012  . Obesity, unspecified 03/26/2012  . Allergic rhinitis 10/19/2009    Past Surgical History:  Procedure Laterality Date  . ANKLE SURGERY  2003   right   .  cracked chest  2007   equipment fell on chest and cracked his chest, had pain related to injury, cardiologist confirmed not related to heart.    . CRANIOTOMY Right 08/04/2018   Right frontal - cyst removal  . LITHOTRIPSY  2010  . nose fracture  2000       No family history on file.  Social History   Tobacco Use  . Smoking status: Never Smoker  . Smokeless tobacco: Never Used  Substance Use Topics  . Alcohol use: No    Alcohol/week: 0.0 standard drinks  . Drug use: No    Home Medications Prior to Admission medications   Medication Sig Start Date End Date Taking? Authorizing Provider  atorvastatin (LIPITOR) 20 MG tablet Take 1 tablet (20 mg total) by mouth daily. 07/22/19   Shamleffer, Melanie Crazier, MD  Blood Glucose Monitoring Suppl (CONTOUR NEXT EZ) w/Device KIT 1 Device by Does not apply route 2 (two) times daily. 04/10/18   Shamleffer, Melanie Crazier, MD  glucose blood (CONTOUR NEXT TEST) test strip 1 each by Other route See admin instructions. Use as instructed 04/29/20   Shamleffer, Melanie Crazier, MD  HYDROcodone-acetaminophen (NORCO/VICODIN) 5-325 MG tablet Take 1 tablet by mouth every 6 (six) hours as needed. 08/03/19   Kema Santaella, Barbette Hair, MD  lisinopril-hydrochlorothiazide (PRINZIDE,ZESTORETIC) 20-25 MG tablet TAKE 1 TABLET BY MOUTH EVERY DAY 08/14/18   Richardo Priest, MD  MICROLET LANCETS MISC Use to check blood sugars  once a day with Contour Next glucometer 04/10/18   Shamleffer, Konrad Dolores, MD  minocycline (MINOCIN,DYNACIN) 50 MG capsule Take 50 mg by mouth 2 (two) times daily.    [provider]  Testosterone 20.25 MG/ACT (1.62%) GEL 2 PUMPS ON EACH ARM DAILY 04/22/20   Shamleffer, Konrad Dolores, MD    Allergies    Patient has no known allergies.  Review of Systems   Review of Systems  Constitutional: Positive for fever.  Respiratory: Negative for shortness of breath.   Cardiovascular: Negative for chest pain.  Gastrointestinal: Positive for  rectal pain. Negative for abdominal pain, nausea and vomiting.  Genitourinary: Negative for dysuria and hematuria.  All other systems reviewed and are negative.   Physical Exam Updated Vital Signs BP (!) 81/62   Pulse 71   Temp 99 F (37.2 C) (Oral)   Resp 20   Ht 1.778 m (5\' 10" )   Wt 123.4 kg   SpO2 97%   BMI 39.03 kg/m   Physical Exam Vitals and nursing note reviewed.  Constitutional:      Appearance: He is well-developed and well-nourished. He is obese.     Comments: Ill-appearing but nontoxic  HENT:     Head: Normocephalic and atraumatic.     Mouth/Throat:     Mouth: Mucous membranes are moist.  Eyes:     Pupils: Pupils are equal, round, and reactive to light.  Cardiovascular:     Rate and Rhythm: Regular rhythm. Tachycardia present.     Heart sounds: Normal heart sounds. No murmur heard.   Pulmonary:     Effort: Pulmonary effort is normal. No respiratory distress.     Breath sounds: Normal breath sounds. No wheezing.  Abdominal:     General: Bowel sounds are normal.     Palpations: Abdomen is soft.     Tenderness: There is no abdominal tenderness. There is no rebound.  Genitourinary:    Comments: Exquisite tenderness with external examination of the anus, patient did not tolerate digital exam, no external crepitus or erythema, no fissures noted, no scrotal tenderness or redness, no crepitus, GU exam is otherwise unremarkable Chaperone present Musculoskeletal:        General: No edema.     Cervical back: Neck supple.  Lymphadenopathy:     Cervical: No cervical adenopathy.  Skin:    General: Skin is warm and dry.  Neurological:     Mental Status: He is alert and oriented to person, place, and time.  Psychiatric:        Mood and Affect: Mood and affect and mood normal.     ED Results / Procedures / Treatments   Labs (all labs ordered are listed, but only abnormal results are displayed) Labs Reviewed  LACTIC ACID, PLASMA - Abnormal; Notable for the  following components:      Result Value   Lactic Acid, Venous 2.8 (*)    All other components within normal limits  LACTIC ACID, PLASMA - Abnormal; Notable for the following components:   Lactic Acid, Venous 2.8 (*)    All other components within normal limits  COMPREHENSIVE METABOLIC PANEL - Abnormal; Notable for the following components:   Glucose, Bld 124 (*)    All other components within normal limits  CBC WITH DIFFERENTIAL/PLATELET - Abnormal; Notable for the following components:   WBC 17.6 (*)    Neutro Abs 13.5 (*)    Monocytes Absolute 1.6 (*)    All other components within normal limits  SARS CORONAVIRUS  2 BY RT PCR (HOSPITAL ORDER, [facility])  CULTURE, BLOOD (ROUTINE X 2)  CULTURE, BLOOD (ROUTINE X 2)  URINE CULTURE  PROTIME-INR  APTT  URINALYSIS, ROUTINE W REFLEX MICROSCOPIC  PROCALCITONIN  BRAIN NATRIURETIC PEPTIDE    EKG EKG Interpretation  Date/Time:  Monday July 18 2020 02:10:45 EST Ventricular Rate:  99 PR Interval:    QRS Duration: 98 QT Interval:  338 QTC Calculation: 434 R Axis:   73 Text Interpretation: Sinus rhythm Confirmed by Thayer Jew [phone]) on 07/18/2020 2:26:13 AM   Radiology CT ABDOMEN PELVIS W CONTRAST  Addendum Date: 07/18/2020   ADDENDUM REPORT: 07/18/2020 04:03 ADDENDUM: Findings in impressions all show mention a 1.5 cm fluid density lesion posterior to the rectum/anus likely representing a tiny perianal abscess. These results were called by telephone at the time of interpretation on 07/18/2020 at 4:03 am to provider [facility] , who verbally acknowledged these results. Electronically Signed   By: Iven Finn M.D.   On: 07/18/2020 04:03   Result Date: 07/18/2020 CLINICAL DATA:  low back and rectal pain that started around 0730 Sunday morning. Also endorses having 2 diarrhea stools today as well as nausea and chills. Low grade temp currently. EXAM: CT ABDOMEN AND PELVIS WITH CONTRAST TECHNIQUE:  Multidetector CT imaging of the abdomen and pelvis was performed using the standard protocol following bolus administration of intravenous contrast. CONTRAST:  183mL OMNIPAQUE IOHEXOL 300 MG/ML  SOLN COMPARISON:  CT abdomen/pelvis 08/03/19. FINDINGS: Lower chest: No acute abnormality. Hepatobiliary: No focal liver abnormality is seen. No gallstones, gallbladder wall thickening, or biliary dilatation. Pancreas: Unremarkable. No pancreatic ductal dilatation or surrounding inflammatory changes. Spleen: Normal in size without focal abnormality. Adrenals/Urinary Tract: No adrenal nodule bilaterally. No nephrolithiasis, no hydronephrosis, and no contour-deforming renal mass. No ureterolithiasis or hydroureter. The urinary bladder is unremarkable. On delayed imaging, there is no urothelial wall thickening and there are no filling defects in the opacified portions of the bilateral collecting systems or ureters. Stomach/Bowel: Stomach is within normal limits. No evidence of bowel wall thickening or dilatation. Descending colon and sigmoid diverticulosis. Appendix not identified. Vascular/Lymphatic: No significant vascular findings are present. No enlarged abdominal or pelvic lymph nodes. Reproductive: Prostate is unremarkable. Other: No intraperitoneal free fluid. No intraperitoneal free gas. No organized fluid collection. Musculoskeletal: No abdominal wall hernia or abnormality No suspicious lytic or blastic osseous lesions. No acute displaced fracture. Multilevel degenerative changes of the spine. IMPRESSION: Colonic diverticulosis with no definite acute diverticulitis. Electronically Signed: By: Iven Finn M.D. On: 07/18/2020 03:52   DG Chest Port 1 View  Result Date: 07/18/2020 CLINICAL DATA:  Back pain and low-grade fever EXAM: PORTABLE CHEST 1 VIEW COMPARISON:  10/23/2019 FINDINGS: The heart size and mediastinal contours are within normal limits. Both lungs are clear. The visualized skeletal structures are  unremarkable. IMPRESSION: No active disease. Electronically Signed   By: Inez Catalina M.D.   On: 07/18/2020 02:07    Procedures .Critical Care Performed by: Merryl Hacker, MD Authorized by: Merryl Hacker, MD   Critical care provider statement:    Critical care time (minutes):  65   Critical care time was exclusive of:  Separately billable procedures and treating other patients   Critical care was necessary to treat or prevent imminent or life-threatening deterioration of the following conditions:  Sepsis   Critical care was time spent personally by me on the following activities:  Development of treatment plan with patient or surrogate, ordering and review  of laboratory studies, ordering and review of radiographic studies, discussions with consultants, re-evaluation of patient's condition, evaluation of patient's response to treatment, review of old charts and examination of patient   I assumed direction of critical care for this patient from another provider in my specialty: no       Medications Ordered in ED Medications  lactated ringers infusion ( Intravenous Not Given 07/18/20 0540)  0.9 %  sodium chloride infusion (500 mLs Intravenous New Bag/Given 07/18/20 0222)  lactated ringers bolus 1,000 mL (0 mLs Intravenous Stopped 07/18/20 0228)    And  lactated ringers bolus 1,000 mL (0 mLs Intravenous Stopped 07/18/20 0305)    And  lactated ringers bolus 400 mL (400 mLs Intravenous New Bag/Given 07/18/20 0307)  vancomycin (VANCOCIN) IVPB 1000 mg/200 mL premix (1,000 mg Intravenous New Bag/Given 07/18/20 0216)  piperacillin-tazobactam (ZOSYN) IVPB 3.375 g (3.375 g Intravenous New Bag/Given 07/18/20 0224)  morphine 4 MG/ML injection 4 mg (4 mg Intravenous Given 07/18/20 0206)  ondansetron (ZOFRAN) injection 4 mg (4 mg Intravenous Given 07/18/20 0206)  iohexol (OMNIPAQUE) 300 MG/ML solution 100 mL (100 mLs Intravenous Contrast Given 07/18/20 0330)  0.9 %  sodium chloride infusion (  Intravenous New Bag/Given 07/18/20 0412)  morphine 4 MG/ML injection 4 mg (4 mg Intravenous Given 07/18/20 0315)  diphenhydrAMINE (BENADRYL) injection 12.5 mg (12.5 mg Intravenous Given 07/18/20 0327)  lactated ringers bolus 1,000 mL (1,000 mLs Intravenous New Bag/Given 07/18/20 0513)    ED Course  I have reviewed the triage vital signs and the nursing notes.  Pertinent labs & imaging results that were available during my care of the patient were reviewed by me and considered in my medical decision making (see chart for details).  Clinical Course as of 07/18/20 0618  Mon Jul 18, 2020  0507 Patient has received broad-spectrum antibiotics.  Appear septic from likely perianal/perirectal abscess.  No other obvious source.  He has received a total of 3 L of LR.  He has a BMI greater than 35 and this is based on ideal body weight.  However, his blood pressure remains soft at 80s over 60s.  Maps are greater than 65.  30 cc/kg based on his actual weight would be closer to 3600 cc.  An additional 1 L bolus ordered.  He is mentating well and has good perfusion.  We will hold off on vasopressors at this time as he has maps greater than 65.  Will consult with intensivist. [CH]  [phone] Spoke with critical care Dr. Carson Myrtle.  Reviewed the case with him.  He recommends matching fluid resuscitation with urine output given narrow pulse pressure.  Would obtain BNP and possible echo.  Procalcitonin added.  Monitor UOP closely.  Feels that he can be managed to the stepdown unit.  Needs surgical evaluation for source control.  This consult has been placed. [CH]  [phone] Spoke with Dr. Georgette Dover.  Plan to admit to the hospitalist to [facility] long in surgery to evaluate patient.  Will ED to ED transfer patient for surgical eval. [CH]  [phone] Spoke with hospitalist.  Will plan to admit to progressive bed at [facility].  No progressive beds available.  Given need for source control by general surgery, will transfer ED to ED.  Dr. Dayna Barker  advised of patient's transfer.  General surgery will need to be advised of his arrival as well as flow manager for hospitalist admission. [CH]    Clinical Course User Index [CH] Robyn Galati, Barbette Hair, MD   MDM Rules/Calculators/A&P  Patient presents with rectal pain.  Fever at home.  He arrives tachycardic and hypotensive.  Blood pressure is 85/59.  Sepsis work-up initiated.  History of perirectal/perianal abscess.  His exam is significantly limited secondary to discomfort and pain.  Other etiologies include urinary source, pneumonia, Covid.  Patient was given 30 cc/kg based on ideal body weight as he is greater than 35 BMI.  Lactate 2.8.  White count of 17.  No significant metabolic derangements.  Creatinine is close to baseline.  Patient has maintained blood pressures of 80s over 60s with a MAP of 62-70.  Fairly narrow pulse pressure.  He has not responded significantly to fluids.  An additional 1 L of LR was ordered which would put him greater than 30 cc/kg based on actual body weight.  He was given vancomycin and Zosyn to cover broadly.  CT scan of the abdomen pelvis obtained and shows reaccumulation of a perirectal/perianal abscess at 1.5 cm.  No obvious other source of infection.  He will need source control.  I did run patient by critical care.  They recommended matching further fluid resuscitation with urine output.  Patient has put out 300 cc of urine thus far.  He is now on maintenance fluids.  Given that his map is close to goal of 65, would not initiate pressures at this point.  Added additional work-up.  Spoke with general surgery who will evaluate patient upon arrival to [facility].  Additionally, will consult the hospitalist for admission to the stepdown unit.  Patient and his wife are updated.  Final Clinical Impression(s) / ED Diagnoses Final diagnoses:  Severe sepsis ([place])  Perirectal abscess    Rx / DC Orders ED Discharge Orders    None       Johnny Gorter,  Barbette Hair, MD 07/18/20 [id number]    Merryl Hacker, MD 07/18/20 [phone]

## 2020-07-18 NOTE — Assessment & Plan Note (Signed)
-  On Lipitor at home -Hold for now in setting of severe sepsis and resume when able

## 2020-07-18 NOTE — Anesthesia Procedure Notes (Signed)
Procedure Name: LMA Insertion Date/Time: 07/18/2020 2:08 PM Performed by: Mitzie Na, CRNA Pre-anesthesia Checklist: Patient identified, Emergency Drugs available, Suction available and Patient being monitored Patient Re-evaluated:Patient Re-evaluated prior to induction Oxygen Delivery Method: Circle system utilized Preoxygenation: Pre-oxygenation with 100% oxygen Induction Type: IV induction LMA: LMA inserted LMA Size: 4.0 Tube type: Oral Number of attempts: 1 Placement Confirmation: positive ETCO2 and breath sounds checked- equal and bilateral Tube secured with: Tape Dental Injury: Teeth and Oropharynx as per pre-operative assessment

## 2020-07-18 NOTE — Telephone Encounter (Signed)
Do you know what the patient mean by "Stress Doses"?

## 2020-07-18 NOTE — ED Notes (Signed)
Pt returned from CT °

## 2020-07-18 NOTE — ED Notes (Signed)
[facility] arrived with pt. [facility] gave report to this RN stating pt being transferred from [facility] with complaints of fever and rectal pain. [facility] states [facility] states this pt was "too unstable" to stay there and needed to be transferred.

## 2020-07-18 NOTE — Consult Note (Signed)
[facility] Surgery Consult Note  [name] 03-26-1976  [id number].    Requesting MD: Dwyane Dee Chief Complaint/Reason for Consult: perirectal abscess  HPI:  Cody Sullivan is a 45yo male PMH HTN, HLD, obesity BMI 39.03 who was transferred from [facility] to [facility] for evaluation of perirectal pain. States that it started around Friday with severe perirectal pain. The pain gradually got worse. Denies noticing any overlying skin changes to active drainage. Associated with fever up to 102, chills, 1 episode of n/v yesterday. Denies abdominal pain, dysuria, constipation, diarrhea, blood in stool, productive cough, SOB, or CP. States that he has had 1 prior perirectal abscess 3 years ago that resolved with antibiotics. He has never had a colonoscopy. In the ED patient was found to be hypotensive, tachycardic, lactic acid 2.8, WBC 17.6, covid negative. CT scan showed 1.5 cm fluid density lesion posterior to the rectum/anus likely representing a tiny perianal abscess. U/a and CXR ok. Patient was admitted to the medical service. He was started on IV maxipime, vancomycin, flagyl. General surgery asked to see.  He has had nothing to eat/drink today.  Anticoagulants: none Nonsmoker  Review of Systems  Constitutional: Positive for chills and fever.  Respiratory: Positive for cough. Negative for sputum production and shortness of breath.   Cardiovascular: Negative.   Gastrointestinal: Positive for nausea and vomiting. Negative for abdominal pain, blood in stool, constipation and diarrhea.  Genitourinary: Negative.    All systems reviewed and otherwise negative except for as above  Family History  Problem Relation Age of Onset  . Colon cancer Mother     Past Medical History:  Diagnosis Date  . Allergy   . Diabetes mellitus without complication ([place])   . Hypertension   . Kidney stones   . Sleep apnea    cpap    Past Surgical History:  Procedure Laterality Date  . ANKLE SURGERY  2003    right   . cracked chest  2007   equipment fell on chest and cracked his chest, had pain related to injury, cardiologist confirmed not related to heart.    . CRANIOTOMY Right 08/04/2018   Right frontal - cyst removal  . LITHOTRIPSY  2010  . nose fracture  2000    Social History:  reports that he has never smoked. He has never used smokeless tobacco. He reports that he does not drink alcohol and does not use drugs.  Allergies: No Known Allergies  (Not in a hospital admission)   Prior to Admission medications   Medication Sig Start Date End Date Taking? Authorizing Provider  acetaminophen (TYLENOL) 500 MG tablet Take 1,000 mg by mouth every 6 (six) hours as needed for mild pain.   Yes [provider]  atorvastatin (LIPITOR) 20 MG tablet Take 1 tablet (20 mg total) by mouth daily. 07/22/19  Yes Shamleffer, Melanie Crazier, MD  lisinopril-hydrochlorothiazide (PRINZIDE,ZESTORETIC) 20-25 MG tablet TAKE 1 TABLET BY MOUTH EVERY DAY Patient taking differently: Take 1 tablet by mouth daily. 08/14/18  Yes Richardo Priest, MD  methocarbamol (ROBAXIN) 500 MG tablet Take 500 mg by mouth every 6 (six) hours as needed for muscle spasms. 07/08/20  Yes [provider]  Testosterone 20.25 MG/ACT (1.62%) GEL 2 PUMPS ON EACH ARM DAILY Patient taking differently: Apply 2 Pump topically daily. 2 pumps on each arm daily 04/22/20  Yes Shamleffer, Melanie Crazier, MD  Blood Glucose Monitoring Suppl (CONTOUR NEXT EZ) w/Device KIT 1 Device by Does not apply route 2 (two) times daily.  04/10/18   Shamleffer, Melanie Crazier, MD  glucose blood (CONTOUR NEXT TEST) test strip 1 each by Other route See admin instructions. Use as instructed 04/29/20   Shamleffer, Melanie Crazier, MD  HYDROcodone-acetaminophen (NORCO/VICODIN) 5-325 MG tablet Take 1 tablet by mouth every 6 (six) hours as needed. Patient not taking: Reported on 07/18/2020 08/03/19   Horton, Barbette Hair, MD  MICROLET LANCETS MISC Use to check  blood sugars once a day with Contour Next glucometer 04/10/18   Shamleffer, Melanie Crazier, MD    Blood pressure 115/68, pulse 74, temperature 100.3 F (37.9 C), temperature source Oral, resp. rate 18, height $RemoveBe'5\' 10"'eoFpqhhTQ$  (1.778 m), weight 123.4 kg, SpO2 96 %. Physical Exam: General: pleasant, WD/WN male who is laying in bed in NAD HEENT: head is normocephalic, atraumatic.  Sclera are noninjected.  PERRL.  Ears and nose without any masses or lesions.  Mouth is pink and moist. Dentition fair Heart: mild tachycardia, regular rhythm.  Normal s1,s2. No obvious murmurs, gallops, or rubs noted.  Palpable pedal pulses bilaterally  Lungs: CTAB, no wheezes, rhonchi, or rales noted.  Respiratory effort nonlabored Abd: soft, NT/ND, +BS, no masses, hernias, or organomegaly MS: no BUE/BLE edema, calves soft and nontender Skin: warm and dry with no masses, lesions, or rashes Psych: A&Ox4 with an appropriate affect Neuro: cranial nerves grossly intact, equal strength in BUE/BLE bilaterally, normal speech, thought process intact GU: no external perirectal skin changes  Results for orders placed or performed during the hospital encounter of 07/18/20 (from the past 48 hour(s))  Comprehensive metabolic panel     Status: Abnormal   Collection Time: 07/18/20  1:49 AM  Result Value Ref Range   Sodium 136 135 - 145 mmol/L   Potassium 3.7 3.5 - 5.1 mmol/L   Chloride 102 98 - 111 mmol/L   CO2 22 22 - 32 mmol/L   Glucose, Bld 124 (H) 70 - 99 mg/dL    Comment: Glucose reference range applies only to samples taken after fasting for at least 8 hours.   BUN 18 6 - 20 mg/dL   Creatinine, Ser 1.17 0.61 - 1.24 mg/dL   Calcium 9.2 8.9 - 10.3 mg/dL   Total Protein 7.1 6.5 - 8.1 g/dL   Albumin 4.1 3.5 - 5.0 g/dL   AST 27 15 - 41 U/L   ALT 22 0 - 44 U/L   Alkaline Phosphatase 51 38 - 126 U/L   Total Bilirubin 0.5 0.3 - 1.2 mg/dL   GFR, Estimated >60 >60 mL/min    Comment: (NOTE) Calculated using the CKD-EPI Creatinine  Equation (2021)    Anion gap 12 5 - 15    Comment: Performed at [facility], [address] [place], [place] [address]  CBC WITH DIFFERENTIAL     Status: Abnormal   Collection Time: 07/18/20  1:49 AM  Result Value Ref Range   WBC 17.6 (H) 4.0 - 10.5 K/uL   RBC 5.28 4.22 - 5.81 MIL/uL   Hemoglobin 15.6 13.0 - 17.0 g/dL   HCT 46.1 39.0 - 52.0 %   MCV 87.3 80.0 - 100.0 fL   MCH 29.5 26.0 - 34.0 pg   MCHC 33.8 30.0 - 36.0 g/dL   RDW 13.7 11.5 - 15.5 %   Platelets 275 150 - 400 K/uL   nRBC 0.0 0.0 - 0.2 %   Neutrophils Relative % 78 %   Neutro Abs 13.5 (H) 1.7 - 7.7 K/uL   Lymphocytes Relative 13 %   Lymphs  Abs 2.2 0.7 - 4.0 K/uL   Monocytes Relative 9 %   Monocytes Absolute 1.6 (H) 0.1 - 1.0 K/uL   Eosinophils Relative 0 %   Eosinophils Absolute 0.1 0.0 - 0.5 K/uL   Basophils Relative 0 %   Basophils Absolute 0.1 0.0 - 0.1 K/uL   Immature Granulocytes 0 %   Abs Immature Granulocytes 0.06 0.00 - 0.07 K/uL    Comment: Performed at [facility], [address] [place], [place] [address]  Protime-INR     Status: None   Collection Time: 07/18/20  1:49 AM  Result Value Ref Range   Prothrombin Time 12.9 11.4 - 15.2 seconds   INR 1.0 0.8 - 1.2    Comment: (NOTE) INR goal varies based on device and disease states. Performed at [facility], [address] [place], [place] [address]   APTT     Status: None   Collection Time: 07/18/20  1:49 AM  Result Value Ref Range   aPTT 30 24 - 36 seconds    Comment: Performed at [facility], [address] [place], [place] [address]  Procalcitonin - Baseline     Status: None   Collection Time: 07/18/20  1:49 AM  Result Value Ref Range   Procalcitonin <0.10 ng/mL    Comment:        Interpretation: PCT (Procalcitonin) <= 0.5 ng/mL: Systemic infection (sepsis) is not likely. Local bacterial infection is possible. (NOTE)       Sepsis PCT Algorithm           Lower Respiratory Tract                                       Infection PCT Algorithm    ----------------------------     ----------------------------         PCT < 0.25 ng/mL                PCT < 0.10 ng/mL          Strongly encourage             Strongly discourage   discontinuation of antibiotics    initiation of antibiotics    ----------------------------     -----------------------------       PCT 0.25 - 0.50 ng/mL            PCT 0.10 - 0.25 ng/mL               OR       >80% decrease in PCT            Discourage initiation of                                            antibiotics      Encourage discontinuation           of antibiotics    ----------------------------     -----------------------------         PCT >= 0.50 ng/mL              PCT 0.26 - 0.50 ng/mL               AND        <80% decrease in PCT  Encourage initiation of                                             antibiotics       Encourage continuation           of antibiotics    ----------------------------     -----------------------------        PCT >= 0.50 ng/mL                  PCT > 0.50 ng/mL               AND         increase in PCT                  Strongly encourage                                      initiation of antibiotics    Strongly encourage escalation           of antibiotics                                     -----------------------------                                           PCT <= 0.25 ng/mL                                                 OR                                        > 80% decrease in PCT                                      Discontinue / Do not initiate                                             antibiotics  Performed at [facility], [address], [place], [place] [address]   Brain natriuretic peptide     Status: None   Collection Time: 07/18/20  1:49 AM  Result Value Ref Range   B Natriuretic Peptide 73.8 0.0 - 100.0 pg/mL    Comment: Performed at [facility], [address] [place], [place] [address]  SARS Coronavirus 2 by RT PCR (hospital order, performed in [facility] hospital lab) Nasopharyngeal Nasopharyngeal Swab     Status: None   Collection Time: 07/18/20  1:52 AM   Specimen: Nasopharyngeal Swab  Result Value Ref Range   SARS Coronavirus 2 NEGATIVE NEGATIVE    Comment: (NOTE) SARS-CoV-2 target nucleic acids  are NOT DETECTED.  The SARS-CoV-2 RNA is generally detectable in upper and lower respiratory specimens during the acute phase of infection. The lowest concentration of SARS-CoV-2 viral copies this assay can detect is 250 copies / mL. A negative result does not preclude SARS-CoV-2 infection and should not be used as the sole basis for treatment or other patient management decisions.  A negative result may occur with improper specimen collection / handling, submission of specimen other than nasopharyngeal swab, presence of viral mutation(s) within the areas targeted by this assay, and inadequate number of viral copies (<250 copies / mL). A negative result must be combined with clinical observations, patient history, and epidemiological information.  Fact Sheet for Patients:   StrictlyIdeas.no  Fact Sheet for Healthcare Providers: BankingDealers.co.za  This test is not yet approved or  cleared by the [place] FDA and has been authorized for detection and/or diagnosis of SARS-CoV-2 by FDA under an Emergency Use Authorization (EUA).  This EUA will remain in effect (meaning this test can be used) for the duration of the COVID-19 declaration under Section 564(b)(1) of the Act, 21 U.S.C. section 360bbb-3(b)(1), unless the authorization is terminated or revoked sooner.  Performed at [facility], [address] [place], [place] [address]   Lactic acid, plasma     Status: Abnormal   Collection Time: 07/18/20  1:52 AM  Result Value Ref Range   Lactic Acid, Venous 2.8 (HH) 0.5 -  1.9 mmol/L    Comment: CRITICAL RESULT CALLED TO, READ BACK BY AND VERIFIED WITH: MAYNARD,C AT 0234 ON [id number] BY CHERESNOWSKY,T Performed at [facility], [address] [place], [place] [address]   Lactic acid, plasma     Status: Abnormal   Collection Time: 07/18/20  4:00 AM  Result Value Ref Range   Lactic Acid, Venous 2.8 (HH) 0.5 - 1.9 mmol/L    Comment: CRITICAL RESULT CALLED TO, READ BACK BY AND VERIFIED WITH: POWELL,V AT [place] ON [id number] BY CHERESNOWSKY,T Performed at [facility], [address] [place], [place] [address]   Urinalysis, Routine w reflex microscopic     Status: None   Collection Time: 07/18/20  4:25 AM  Result Value Ref Range   Color, Urine YELLOW YELLOW   APPearance CLEAR CLEAR   Specific Gravity, Urine 1.005 1.005 - 1.030   pH 5.0 5.0 - 8.0   Glucose, UA NEGATIVE NEGATIVE mg/dL   Hgb urine dipstick NEGATIVE NEGATIVE   Bilirubin Urine NEGATIVE NEGATIVE   Ketones, ur NEGATIVE NEGATIVE mg/dL   Protein, ur NEGATIVE NEGATIVE mg/dL   Nitrite NEGATIVE NEGATIVE   Leukocytes,Ua NEGATIVE NEGATIVE    Comment: Microscopic not done on urines with negative protein, blood, leukocytes, nitrite, or glucose < 500 mg/dL. Performed at [facility], [address] [place], [place] [address]   TSH     Status: None   Collection Time: 07/18/20 10:16 AM  Result Value Ref Range   TSH 1.431 0.350 - 4.500 uIU/mL    Comment: Performed by a 3rd Generation assay with a functional sensitivity of <=0.01 uIU/mL. Performed at [facility], [place] [address] [place], [place] [address]   Comprehensive metabolic panel     Status: Abnormal   Collection Time: 07/18/20 10:40 AM  Result Value Ref Range   Sodium 139 135 - 145 mmol/L   Potassium 3.9 3.5 - 5.1 mmol/L   Chloride 103 98 - 111 mmol/L   CO2 27 22 -  32 mmol/L   Glucose, Bld 87 70 - 99 mg/dL    Comment: Glucose reference range applies only to samples taken after fasting for at  least 8 hours.   BUN 12 6 - 20 mg/dL   Creatinine, Ser 0.95 0.61 - 1.24 mg/dL   Calcium 8.7 (L) 8.9 - 10.3 mg/dL   Total Protein 6.7 6.5 - 8.1 g/dL   Albumin 3.9 3.5 - 5.0 g/dL   AST 34 15 - 41 U/L   ALT 35 0 - 44 U/L   Alkaline Phosphatase 51 38 - 126 U/L   Total Bilirubin 1.1 0.3 - 1.2 mg/dL   GFR, Estimated >60 >60 mL/min    Comment: (NOTE) Calculated using the CKD-EPI Creatinine Equation (2021)    Anion gap 9 5 - 15    Comment: Performed at [facility], [place] [address], [place], [place] [address]  Lactic acid, plasma     Status: Abnormal   Collection Time: 07/18/20 10:45 AM  Result Value Ref Range   Lactic Acid, Venous 2.7 (HH) 0.5 - 1.9 mmol/L    Comment: CRITICAL RESULT CALLED TO, READ BACK BY AND VERIFIED WITHBrett Fairy RN AT 1134 07/18/20 MULLINS,T  Performed at [facility], [place] [address] [place], [place] [address]   CBC with Differential/Platelet     Status: Abnormal   Collection Time: 07/18/20 11:44 AM  Result Value Ref Range   WBC 12.6 (H) 4.0 - 10.5 K/uL   RBC 4.49 4.22 - 5.81 MIL/uL   Hemoglobin 13.3 13.0 - 17.0 g/dL   HCT 40.7 39.0 - 52.0 %   MCV 90.6 80.0 - 100.0 fL   MCH 29.6 26.0 - 34.0 pg   MCHC 32.7 30.0 - 36.0 g/dL   RDW 13.9 11.5 - 15.5 %   Platelets 183 150 - 400 K/uL   nRBC 0.0 0.0 - 0.2 %   Neutrophils Relative % 74 %   Neutro Abs 9.3 (H) 1.7 - 7.7 K/uL   Lymphocytes Relative 17 %   Lymphs Abs 2.2 0.7 - 4.0 K/uL   Monocytes Relative 8 %   Monocytes Absolute 1.0 0.1 - 1.0 K/uL   Eosinophils Relative 0 %   Eosinophils Absolute 0.1 0.0 - 0.5 K/uL   Basophils Relative 0 %   Basophils Absolute 0.0 0.0 - 0.1 K/uL   Immature Granulocytes 1 %   Abs Immature Granulocytes 0.06 0.00 - 0.07 K/uL    Comment: Performed at [facility], [place] [address] [place], [place] [address]  CBG monitoring, ED     Status: Abnormal   Collection Time: 07/18/20 12:01 PM  Result Value Ref Range    Glucose-Capillary 59 (L) 70 - 99 mg/dL    Comment: Glucose reference range applies only to samples taken after fasting for at least 8 hours.   CT ABDOMEN PELVIS W CONTRAST  Addendum Date: 07/18/2020   ADDENDUM REPORT: 07/18/2020 04:03 ADDENDUM: Findings in impressions all show mention a 1.5 cm fluid density lesion posterior to the rectum/anus likely representing a tiny perianal abscess. These results were called by telephone at the time of interpretation on 07/18/2020 at 4:03 am to provider [facility] , who verbally acknowledged these results. Electronically Signed   By: Iven Finn M.D.   On: 07/18/2020 04:03   Result Date: 07/18/2020 CLINICAL DATA:  low back and rectal pain that started around 0730 Sunday morning. Also endorses having 2 diarrhea stools today as well as nausea and chills. Low grade temp  currently. EXAM: CT ABDOMEN AND PELVIS WITH CONTRAST TECHNIQUE: Multidetector CT imaging of the abdomen and pelvis was performed using the standard protocol following bolus administration of intravenous contrast. CONTRAST:  163mL OMNIPAQUE IOHEXOL 300 MG/ML  SOLN COMPARISON:  CT abdomen/pelvis 08/03/19. FINDINGS: Lower chest: No acute abnormality. Hepatobiliary: No focal liver abnormality is seen. No gallstones, gallbladder wall thickening, or biliary dilatation. Pancreas: Unremarkable. No pancreatic ductal dilatation or surrounding inflammatory changes. Spleen: Normal in size without focal abnormality. Adrenals/Urinary Tract: No adrenal nodule bilaterally. No nephrolithiasis, no hydronephrosis, and no contour-deforming renal mass. No ureterolithiasis or hydroureter. The urinary bladder is unremarkable. On delayed imaging, there is no urothelial wall thickening and there are no filling defects in the opacified portions of the bilateral collecting systems or ureters. Stomach/Bowel: Stomach is within normal limits. No evidence of bowel wall thickening or dilatation. Descending colon and sigmoid  diverticulosis. Appendix not identified. Vascular/Lymphatic: No significant vascular findings are present. No enlarged abdominal or pelvic lymph nodes. Reproductive: Prostate is unremarkable. Other: No intraperitoneal free fluid. No intraperitoneal free gas. No organized fluid collection. Musculoskeletal: No abdominal wall hernia or abnormality No suspicious lytic or blastic osseous lesions. No acute displaced fracture. Multilevel degenerative changes of the spine. IMPRESSION: Colonic diverticulosis with no definite acute diverticulitis. Electronically Signed: By: Iven Finn M.D. On: 07/18/2020 03:52   DG Chest Port 1 View  Result Date: 07/18/2020 CLINICAL DATA:  Back pain and low-grade fever EXAM: PORTABLE CHEST 1 VIEW COMPARISON:  10/23/2019 FINDINGS: The heart size and mediastinal contours are within normal limits. Both lungs are clear. The visualized skeletal structures are unremarkable. IMPRESSION: No active disease. Electronically Signed   By: Inez Catalina M.D.   On: 07/18/2020 02:07   Anti-infectives (From admission, onward)   Start     Dose/Rate Route Frequency Ordered Stop   07/18/20 1200  ceFEPIme (MAXIPIME) 2 g in sodium chloride 0.9 % 100 mL IVPB        2 g 200 mL/hr over 30 Minutes Intravenous Every 8 hours 07/18/20 1026     07/18/20 1200  vancomycin (VANCOCIN) IVPB 1000 mg/200 mL premix        1,000 mg 100 mL/hr over 120 Minutes Intravenous Every 12 hours 07/18/20 1026     07/18/20 1030  ceFEPIme (MAXIPIME) 2 g in sodium chloride 0.9 % 100 mL IVPB  Status:  Discontinued        2 g 200 mL/hr over 30 Minutes Intravenous  Once 07/18/20 1015 07/18/20 1026   07/18/20 1030  metroNIDAZOLE (FLAGYL) IVPB 500 mg        500 mg 100 mL/hr over 60 Minutes Intravenous Every 8 hours 07/18/20 1015     07/18/20 0200  vancomycin (VANCOCIN) IVPB 1000 mg/200 mL premix        1,000 mg 200 mL/hr over 60 Minutes Intravenous  Once 07/18/20 0155 07/18/20 0727   07/18/20 0200  piperacillin-tazobactam  (ZOSYN) IVPB 3.375 g        3.375 g 100 mL/hr over 30 Minutes Intravenous  Once 07/18/20 0155 07/18/20 0727        Assessment/Plan HTN HLD BMI 39.03 Hypogonadism ?DM - A1c pending  Sepsis Perirectal abscess - Patient presented to ED with perirectal pain and sepsis, suspected to be secondary to small perirectal abscess seen on CT scan. No other source identified at this time. Agree with broad spectrum antibiotics. He is responding well to resuscitation. Will take to the OR today for EUA, possible I&D. Keep NPO for now.  ID - zosyn x1, maxipime/vancomycin/flagyl 1/31 VTE - SCDs FEN - IVF, NPO Foley - none Follow up - TBD  Wellington Hampshire, [facility] 07/18/2020, 12:10 PM Please see Amion for pager number during day hours 7:00am-4:30pm

## 2020-07-18 NOTE — Assessment & Plan Note (Signed)
-   Body mass index is 39.03 kg/m. - patient has been working on weight loss outpatient per endocrinology; continue ongoing lifestyle modifications

## 2020-07-18 NOTE — Assessment & Plan Note (Signed)
-  Follows with endocrinology.  Last seen on 04/29/2020 -Last hormone levels per endocrinology were acceptable -Continue AndroGel 4 pumps (5g) daily

## 2020-07-18 NOTE — Telephone Encounter (Signed)
Spoke to Dr . Dwyane Dee on 07/18/2020 . Pt does NOT need   stress dose steroids  from the endocrinology stand point but he may need based on septic shock protocol.    Abby Nena Jordan, MD  [facility]  [facility] [address] [place], [place] [address] Phone: [phone] FAX: [phone]

## 2020-07-18 NOTE — Progress Notes (Signed)
Communication took place via secure chat with Dr. Dina Rich, MD will be using IBW of 39 kg and will be giving 3 Liters, 2 SBP less than 90 noted

## 2020-07-18 NOTE — Progress Notes (Signed)
elink following for sepsis protocol  

## 2020-07-18 NOTE — Hospital Course (Addendum)
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 He has had associated subjective fever and chills at home. His pain is described worst around his rectum and he has been unable to sit or apply pressure/weight to the area as well.  He had a similar episode in the past which resolved with medication/antibiotics and did not require surgery per patient.  He underwent CT abdomen/pelvis which revealed a 1.5 cm fluid collection posterior to the rectum/anus likely representing a perianal abscess. His case was discussed with surgery and he was recommended for transfer for surgical evaluation, antibiotics, and source control.  He was hypotensive on evaluation, 80s/50s. Lactic acid 2.8>>2.8. He was treated with LR bolus 2.4 L and vanc/zosyn at [facility].  Morphine also given for pain.  Other notable labs include: Na 136, K 3.7, BUN 18, Creat 1.17, Glucose 124 WBC 17.6, Hgb 15.6, HCT 46.1, PLTC 275 PCT <0.1 BNP 73.8 Blood cultures obtained x 2 UA negative  Covid swab negative Lactic acid at 0100 was 2.8 and again 2.8 at repeat at 0400  Surgery was consulted on arrival to [facility]. He is kept NPO. Abx, IVF, and pain control continued.

## 2020-07-18 NOTE — ED Notes (Signed)
Lab called a lactic of 2.8. Dr. Dina Rich aware.

## 2020-07-18 NOTE — ED Notes (Addendum)
One hive noted on pts LFA just above IV site. Vancomycin stopped, and EDP notified. Order given for Benadryl. Pt denies any sx other than itching to the area. No distress, SHOB, or other sx noted.

## 2020-07-18 NOTE — Progress Notes (Signed)
Pharmacy Antibiotic Note  Cody Sullivan is a 45 y.o. male admitted on 07/18/2020 with IAI, perianal abscess.  Pharmacy has been consulted for vanc/cefepime dosing.  Plan:  Cefepime 2g IV q8  Vanc 1g IV q12 - goal AUC 400-550  Note that per notes, pt had possible rxn to vanc - as a result will slow down vanc infusion rate and order benadryl prn for subsequent doses if needed  Height: 5\' 10"  (177.8 cm) Weight: 123.4 kg (272 lb) IBW/kg (Calculated) : 73  Temp (24hrs), Avg:99.5 F (37.5 C), Min:98.9 F (37.2 C), Max:100.3 F (37.9 C)  Recent Labs  Lab 07/18/20 0149 07/18/20 0152 07/18/20 0400  WBC 17.6*  --   --   CREATININE 1.17  --   --   LATICACIDVEN  --  2.8* 2.8*    Estimated Creatinine Clearance: 105.1 mL/min (by C-G formula based on SCr of 1.17 mg/dL).    No Known Allergies    Thank you for allowing pharmacy to be a part of this patient's care.  Kara Mead 07/18/2020 10:23 AM

## 2020-07-18 NOTE — ED Notes (Signed)
Report provided to [facility] for transport, Night shift RN reported to Agricultural consultant at ED

## 2020-07-18 NOTE — Progress Notes (Signed)
ICU RN informed of patient's packing (perirectal surgical wound packing) to be removed in the AM or if it falls out to be replaced talked with Hailey,Rn.

## 2020-07-18 NOTE — ED Notes (Signed)
Transferred via [facility]

## 2020-07-18 NOTE — Anesthesia Postprocedure Evaluation (Signed)
Anesthesia Post Note  Patient: [place]  Procedure(s) Performed: IRRIGATION AND DEBRIDEMENT PERIRECTAL ABSCESS (N/A )     Patient location during evaluation: PACU Anesthesia Type: General Level of consciousness: awake and alert Pain management: pain level controlled Vital Signs Assessment: post-procedure vital signs reviewed and stable Respiratory status: spontaneous breathing, nonlabored ventilation, respiratory function stable and patient connected to nasal cannula oxygen Cardiovascular status: blood pressure returned to baseline and stable Postop Assessment: no apparent nausea or vomiting Anesthetic complications: no   No complications documented.  Last Vitals:  Vitals:   07/18/20 1630 07/18/20 1645  BP: (!) 107/40 (!) 116/39  Pulse: 81 72  Resp: (!) 21 14  Temp: 37.2 C   SpO2: 96% 95%    Last Pain:  Vitals:   07/18/20 1645  TempSrc:   PainSc: 3                  Tiaja Hagan,W. EDMOND

## 2020-07-18 NOTE — Assessment & Plan Note (Addendum)
-  Well-controlled, last A1c 5.9% on 04/29/2020 -Continue on SSI and CBG monitoring while hospitalized; q4h while NPO then can resume to [employer]

## 2020-07-18 NOTE — Anesthesia Preprocedure Evaluation (Addendum)
Anesthesia Evaluation  Patient identified by MRN, date of birth, ID band Patient awake    Reviewed: Allergy & Precautions, H&P , NPO status , Patient's Chart, lab work & pertinent test results  Airway Mallampati: III  TM Distance: >3 FB Neck ROM: Full    Dental no notable dental hx. (+) Teeth Intact, Dental Advisory Given   Pulmonary sleep apnea and Continuous Positive Airway Pressure Ventilation ,    Pulmonary exam normal breath sounds clear to auscultation       Cardiovascular hypertension, On Medications  Rhythm:Regular Rate:Normal     Neuro/Psych negative neurological ROS  negative psych ROS   GI/Hepatic negative GI ROS, Neg liver ROS,   Endo/Other  diabetes, Well ControlledMorbid obesity  Renal/GU negative Renal ROS  negative genitourinary   Musculoskeletal   Abdominal   Peds  Hematology negative hematology ROS (+)   Anesthesia Other Findings   Reproductive/Obstetrics negative OB ROS                            Anesthesia Physical Anesthesia Plan  ASA: III  Anesthesia Plan: General   Post-op Pain Management:    Induction: Intravenous  PONV Risk Score and Plan: 3 and Ondansetron, Dexamethasone and Midazolam  Airway Management Planned: LMA  Additional Equipment:   Intra-op Plan:   Post-operative Plan: Extubation in OR  Informed Consent: I have reviewed the patients History and Physical, chart, labs and discussed the procedure including the risks, benefits and alternatives for the proposed anesthesia with the patient or authorized representative who has indicated his/her understanding and acceptance.     Dental advisory given  Plan Discussed with: CRNA  Anesthesia Plan Comments:        Anesthesia Quick Evaluation

## 2020-07-18 NOTE — H&P (Signed)
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 He has had associated subjective fever and chills at home. His pain is described worst around his rectum and he has been unable to sit or apply pressure/weight to the area as well.  He had a similar episode in the past which resolved with medication/antibiotics and did not require surgery per patient.  He underwent CT abdomen/pelvis which revealed a 1.5 cm fluid collection posterior to the rectum/anus likely representing a perianal abscess. His case was discussed with surgery and he was recommended for transfer for surgical evaluation, antibiotics, and source control.  He was hypotensive on evaluation, 80s/50s. Lactic acid 2.8>>2.8. He was treated with LR bolus 2.4 L and vanc/zosyn at [facility].  Morphine also given for pain.  Other notable labs include: Na 136, K 3.7, BUN 18, Creat 1.17, Glucose 124 WBC 17.6, Hgb 15.6, HCT 46.1, PLTC 275 PCT <0.1 BNP 73.8 Blood cultures obtained x 2 UA negative  Covid swab negative Lactic acid at 0100 was 2.8 and again 2.8 at repeat at 0400  Surgery was consulted on arrival to [facility]. He is kept NPO. Abx, IVF, and pain control continued.   I have personally briefly reviewed patient's old medical records in [facility] and discussed patient with the ER provider when appropriate/indicated.  Assessment/Plan: * Severe sepsis (HCC) - WBC 17.6, HR 113, lactic acid 2.8, source considered perianal abscess - s/p vanc/zosyn and LR 2.4 L bolus followed by  maintenance rate - continue on vanc/cefepime/flagyl - continue LR at 125 - trend PCT - surgery following; keep NPO until seen - continue morphine for pain control; zofran for nausea - repeat and trend lactic acid on arrival to [facility]; if still elevated will re-bolus - if MAP<65 and refractory to IVF will warrant vasopressor support  - follow up am cortisol   Type 2 diabetes mellitus with diabetic polyneuropathy, without long-term current use of insulin (HCC) -Well-controlled, last A1c 5.9% on 04/29/2020 -Continue on SSI and CBG monitoring while hospitalized; q4h while NPO then can resume to ACHS  Hypogonadism in male -Follows with endocrinology.  Last seen on 04/29/2020 -Last hormone levels per endocrinology were acceptable -Continue AndroGel 4 pumps (5g) daily  Essential hypertension -Currently hypotensive in setting of severe sepsis -Hold home medications and resume when able  Obesity, Class II, BMI 35-39.9 - Body mass index is 39.03 kg/m. - patient has been working on weight loss outpatient per endocrinology; continue ongoing lifestyle modifications  Hyperlipidemia -On Lipitor at home -Hold for now in setting of severe sepsis and resume when able    Code Status: Full DVT Prophylaxis: SCD pending surgery Anticipated disposition is to: Home  History: Past Medical History:  Diagnosis Date  . Allergy   . Diabetes mellitus without complication ([place])   . Hypertension   . Kidney stones   . Sleep apnea    cpap    Past Surgical History:  Procedure Laterality Date  . ANKLE SURGERY  2003   right   . cracked chest  2007   equipment fell on chest  and cracked his chest, had pain related to injury, cardiologist confirmed not related to heart.    . CRANIOTOMY Right 08/04/2018   Right frontal - cyst removal  . LITHOTRIPSY  2010  . nose fracture  2000     reports that he has never smoked. He has never used smokeless tobacco. He reports that he does not drink alcohol and does  not use drugs.  No Known Allergies  Family History  Problem Relation Age of Onset  . Colon cancer Mother    Home Medications: Prior to Admission medications   Medication Sig Start Date End Date Taking? Authorizing Provider  atorvastatin (LIPITOR) 20 MG tablet Take 1 tablet (20 mg total) by mouth daily. 07/22/19   Shamleffer, Melanie Crazier, MD  Blood Glucose Monitoring Suppl (CONTOUR NEXT EZ) w/Device KIT 1 Device by Does not apply route 2 (two) times daily. 04/10/18   Shamleffer, Melanie Crazier, MD  glucose blood (CONTOUR NEXT TEST) test strip 1 each by Other route See admin instructions. Use as instructed 04/29/20   Shamleffer, Melanie Crazier, MD  HYDROcodone-acetaminophen (NORCO/VICODIN) 5-325 MG tablet Take 1 tablet by mouth every 6 (six) hours as needed. 08/03/19   Horton, Barbette Hair, MD  lisinopril-hydrochlorothiazide (PRINZIDE,ZESTORETIC) 20-25 MG tablet TAKE 1 TABLET BY MOUTH EVERY DAY 08/14/18   Richardo Priest, MD  MICROLET LANCETS MISC Use to check blood sugars once a day with Contour Next glucometer 04/10/18   Shamleffer, Melanie Crazier, MD  minocycline (MINOCIN,DYNACIN) 50 MG capsule Take 50 mg by mouth 2 (two) times daily.    [provider]  Testosterone 20.25 MG/ACT (1.62%) GEL 2 PUMPS ON EACH ARM DAILY 04/22/20   Shamleffer, Melanie Crazier, MD    Review of Systems:  Pertinent items noted in HPI and remainder of comprehensive ROS otherwise negative.  Physical Exam: Vitals:   07/18/20 0715 07/18/20 0730 07/18/20 0800 07/18/20 0927  BP: (!) 109/30 (!) 94/59 102/60 115/68  Pulse: 70 63 65 74  Resp: 20 (!) $Remo'21 17 18  'BBYEO$ Temp:  98.9 F (37.2 C)  100.3 F (37.9 C)  TempSrc:  Oral  Oral  SpO2: 100% 98% 99% 96%  Weight:      Height:       General appearance: alert, cooperative, no distress and but does appear uncomfortable Head: prior frontal scar noted on head, well healed, otherwise unremarkable exam Eyes: EOMI Lungs: clear to auscultation bilaterally Heart:  regular rate and rhythm and S1, S2 normal Abdomen: normal findings: bowel sounds normal and soft, non-tender Rectal: no palpable lesion, no erythema appreciated on perirecal area, but area very TTP Extremities: no edema Skin: mobility and turgor normal Neurologic: Grossly normal  Labs on Admission:  I have personally reviewed following labs and imaging studies Results for orders placed or performed during the hospital encounter of 07/18/20 (from the past 24 hour(s))  Comprehensive metabolic panel     Status: Abnormal   Collection Time: 07/18/20  1:49 AM  Result Value Ref Range   Sodium 136 135 - 145 mmol/L   Potassium 3.7 3.5 - 5.1 mmol/L   Chloride 102 98 - 111 mmol/L   CO2 22 22 - 32 mmol/L   Glucose, Bld 124 (H) 70 - 99 mg/dL   BUN 18 6 - 20 mg/dL   Creatinine, Ser 1.17 0.61 - 1.24 mg/dL   Calcium 9.2 8.9 - 10.3 mg/dL   Total Protein 7.1 6.5 - 8.1 g/dL   Albumin 4.1 3.5 - 5.0 g/dL   AST 27 15 -  41 U/L   ALT 22 0 - 44 U/L   Alkaline Phosphatase 51 38 - 126 U/L   Total Bilirubin 0.5 0.3 - 1.2 mg/dL   GFR, Estimated >60 >60 mL/min   Anion gap 12 5 - 15  CBC WITH DIFFERENTIAL     Status: Abnormal   Collection Time: 07/18/20  1:49 AM  Result Value Ref Range   WBC 17.6 (H) 4.0 - 10.5 K/uL   RBC 5.28 4.22 - 5.81 MIL/uL   Hemoglobin 15.6 13.0 - 17.0 g/dL   HCT 46.1 39.0 - 52.0 %   MCV 87.3 80.0 - 100.0 fL   MCH 29.5 26.0 - 34.0 pg   MCHC 33.8 30.0 - 36.0 g/dL   RDW 13.7 11.5 - 15.5 %   Platelets 275 150 - 400 K/uL   nRBC 0.0 0.0 - 0.2 %   Neutrophils Relative % 78 %   Neutro Abs 13.5 (H) 1.7 - 7.7 K/uL   Lymphocytes Relative 13 %   Lymphs Abs 2.2 0.7 - 4.0 K/uL   Monocytes Relative 9 %   Monocytes Absolute 1.6 (H) 0.1 - 1.0 K/uL   Eosinophils Relative 0 %   Eosinophils Absolute 0.1 0.0 - 0.5 K/uL   Basophils Relative 0 %   Basophils Absolute 0.1 0.0 - 0.1 K/uL   Immature Granulocytes 0 %   Abs Immature Granulocytes 0.06 0.00 - 0.07 K/uL  Protime-INR     Status: None    Collection Time: 07/18/20  1:49 AM  Result Value Ref Range   Prothrombin Time 12.9 11.4 - 15.2 seconds   INR 1.0 0.8 - 1.2  APTT     Status: None   Collection Time: 07/18/20  1:49 AM  Result Value Ref Range   aPTT 30 24 - 36 seconds  Procalcitonin - Baseline     Status: None   Collection Time: 07/18/20  1:49 AM  Result Value Ref Range   Procalcitonin <0.10 ng/mL  Brain natriuretic peptide     Status: None   Collection Time: 07/18/20  1:49 AM  Result Value Ref Range   B Natriuretic Peptide 73.8 0.0 - 100.0 pg/mL  SARS Coronavirus 2 by RT PCR (hospital order, performed in [place] hospital lab) Nasopharyngeal Nasopharyngeal Swab     Status: None   Collection Time: 07/18/20  1:52 AM   Specimen: Nasopharyngeal Swab  Result Value Ref Range   SARS Coronavirus 2 NEGATIVE NEGATIVE  Lactic acid, plasma     Status: Abnormal   Collection Time: 07/18/20  1:52 AM  Result Value Ref Range   Lactic Acid, Venous 2.8 (HH) 0.5 - 1.9 mmol/L  Lactic acid, plasma     Status: Abnormal   Collection Time: 07/18/20  4:00 AM  Result Value Ref Range   Lactic Acid, Venous 2.8 (HH) 0.5 - 1.9 mmol/L  Urinalysis, Routine w reflex microscopic     Status: None   Collection Time: 07/18/20  4:25 AM  Result Value Ref Range   Color, Urine YELLOW YELLOW   APPearance CLEAR CLEAR   Specific Gravity, Urine 1.005 1.005 - 1.030   pH 5.0 5.0 - 8.0   Glucose, UA NEGATIVE NEGATIVE mg/dL   Hgb urine dipstick NEGATIVE NEGATIVE   Bilirubin Urine NEGATIVE NEGATIVE   Ketones, ur NEGATIVE NEGATIVE mg/dL   Protein, ur NEGATIVE NEGATIVE mg/dL   Nitrite NEGATIVE NEGATIVE   Leukocytes,Ua NEGATIVE NEGATIVE     Radiological Exams on Admission: CT ABDOMEN PELVIS W CONTRAST  Addendum Date: 07/18/2020  ADDENDUM REPORT: 07/18/2020 04:03 ADDENDUM: Findings in impressions all show mention a 1.5 cm fluid density lesion posterior to the rectum/anus likely representing a tiny perianal abscess. These results were called by  telephone at the time of interpretation on 07/18/2020 at 4:03 am to provider [facility] , who verbally acknowledged these results. Electronically Signed   By: Iven Finn M.D.   On: 07/18/2020 04:03   Result Date: 07/18/2020 CLINICAL DATA:  low back and rectal pain that started around 0730 Sunday morning. Also endorses having 2 diarrhea stools today as well as nausea and chills. Low grade temp currently. EXAM: CT ABDOMEN AND PELVIS WITH CONTRAST TECHNIQUE: Multidetector CT imaging of the abdomen and pelvis was performed using the standard protocol following bolus administration of intravenous contrast. CONTRAST:  186mL OMNIPAQUE IOHEXOL 300 MG/ML  SOLN COMPARISON:  CT abdomen/pelvis 08/03/19. FINDINGS: Lower chest: No acute abnormality. Hepatobiliary: No focal liver abnormality is seen. No gallstones, gallbladder wall thickening, or biliary dilatation. Pancreas: Unremarkable. No pancreatic ductal dilatation or surrounding inflammatory changes. Spleen: Normal in size without focal abnormality. Adrenals/Urinary Tract: No adrenal nodule bilaterally. No nephrolithiasis, no hydronephrosis, and no contour-deforming renal mass. No ureterolithiasis or hydroureter. The urinary bladder is unremarkable. On delayed imaging, there is no urothelial wall thickening and there are no filling defects in the opacified portions of the bilateral collecting systems or ureters. Stomach/Bowel: Stomach is within normal limits. No evidence of bowel wall thickening or dilatation. Descending colon and sigmoid diverticulosis. Appendix not identified. Vascular/Lymphatic: No significant vascular findings are present. No enlarged abdominal or pelvic lymph nodes. Reproductive: Prostate is unremarkable. Other: No intraperitoneal free fluid. No intraperitoneal free gas. No organized fluid collection. Musculoskeletal: No abdominal wall hernia or abnormality No suspicious lytic or blastic osseous lesions. No acute displaced fracture.  Multilevel degenerative changes of the spine. IMPRESSION: Colonic diverticulosis with no definite acute diverticulitis. Electronically Signed: By: Iven Finn M.D. On: 07/18/2020 03:52   DG Chest Port 1 View  Result Date: 07/18/2020 CLINICAL DATA:  Back pain and low-grade fever EXAM: PORTABLE CHEST 1 VIEW COMPARISON:  10/23/2019 FINDINGS: The heart size and mediastinal contours are within normal limits. Both lungs are clear. The visualized skeletal structures are unremarkable. IMPRESSION: No active disease. Electronically Signed   By: Inez Catalina M.D.   On: 07/18/2020 02:07   CT ABDOMEN PELVIS W CONTRAST  Final Result  Addendum 1 of 1  ADDENDUM REPORT: 07/18/2020 04:03    ADDENDUM:  Findings in impressions all show mention a 1.5 cm fluid density  lesion posterior to the rectum/anus likely representing a tiny  perianal abscess.    These results were called by telephone at the time of interpretation  on 07/18/2020 at 4:03 am to provider [facility] , who verbally  acknowledged these results.      Electronically Signed    By: Iven Finn M.D.    On: 07/18/2020 04:03      Final    DG Chest [facility]  Final Result      Consults called:  General Surgery   EKG: Independently reviewed. NSR   Dwyane Dee, MD [facility] 07/18/2020, 10:27 AM

## 2020-07-18 NOTE — ED Triage Notes (Signed)
Reports low back and rectal pain that started around 0730 Sunday morning.  Also endorses having 2 diarrhea stools today as well as nausea and chills.  Low grade temp currently.

## 2020-07-18 NOTE — Op Note (Signed)
   Patient: Cody Sullivan (May 25, 1976, [id number])  Date of Surgery: 07/18/2020   Preoperative Diagnosis: PERIRECTAL ABCESS   Postoperative Diagnosis: PERIRECTAL ABCESS   Surgical Procedure: IRRIGATION AND DEBRIDEMENT PERIRECTAL ABSCESS:    Operative Team Members:  Surgeon(s) and Role:    * Liz Pinho, Nickola Major, MD - Primary   Anesthesiologist: Roderic Palau, MD CRNA: Mitzie Na, CRNA; Rosaland Lao, CRNA   Anesthesia: General   Fluids:  Total I/O In: 1949.9 [I.V.:949.9; IV Piggyback:1000] Out: 650 [ITGPQ:982]  Complications: * No complications entered in OR log *  Drains:  none  1 inch iodonated packing left in I+D site for hemostasis, okay to remove in 24 hours or whenever it falls out  Specimen:  ID Type Source Tests Collected by Time Destination  A : PERIRECTAL ABSCESS Abscess Wound AEROBIC/ANAEROBIC CULTURE (SURGICAL/DEEP WOUND) Aleza Pew, Nickola Major, MD 07/18/2020 1423      Disposition:  PACU - hemodynamically stable.  Plan of Care: Admit to inpatient     Indications for Procedure: Cody Sullivan is a 45 y.o. male who presented with sepsis and a perirectal abscess.  The procedure of incision and drainage of perirectal abscess was recommended for the patient.  The procedure itself as well as its risk, benefits, and alternatives were discussed and the patient granted consent to proceed.  Findings: Posterior perirectal abscess  Description of Procedure: On the date stated above patient taken operating suite and placed in supine position.  Antibiotics were given prior to the case start.  SCDs were placed in the lower extremities.  LMA anesthesia was induced.  A timeout was completed verifying the correct patient, procedure, positioning, and equipment needed for the case.  I began with digital rectal exam.  There was some bogginess to the posterior aspect of the perirectal tissues and no other abnormalities noted.  A 18-gauge needle was introduced into the area of  bogginess and pus was aspirated from the abscess.  I then made a small incision over the abscess and broke up any loculations with a hemostat.  A strip of 1 inch iodinated gauze was packed into the incision which provided good hemostasis.  All sponge needle counts are correct at the end of this case.   Louanna Raw, MD General, Bariatric, & Minimally Invasive Surgery [facility], [place]

## 2020-07-19 ENCOUNTER — Encounter (HOSPITAL_COMMUNITY): Payer: Self-pay | Admitting: Surgery

## 2020-07-19 DIAGNOSIS — E669 Obesity, unspecified: Secondary | ICD-10-CM

## 2020-07-19 DIAGNOSIS — E782 Mixed hyperlipidemia: Secondary | ICD-10-CM

## 2020-07-19 DIAGNOSIS — K611 Rectal abscess: Secondary | ICD-10-CM

## 2020-07-19 LAB — LACTIC ACID, PLASMA: Lactic Acid, Venous: 2.7 mmol/L (ref 0.5–1.9)

## 2020-07-19 LAB — GLUCOSE, CAPILLARY
Glucose-Capillary: 116 mg/dL — ABNORMAL HIGH (ref 70–99)
Glucose-Capillary: 165 mg/dL — ABNORMAL HIGH (ref 70–99)
Glucose-Capillary: 183 mg/dL — ABNORMAL HIGH (ref 70–99)
Glucose-Capillary: 152 mg/dL — ABNORMAL HIGH (ref 70–99)
Glucose-Capillary: 87 mg/dL (ref 70–99)
Glucose-Capillary: 154 mg/dL — ABNORMAL HIGH (ref 70–99)
Glucose-Capillary: 100 mg/dL — ABNORMAL HIGH (ref 70–99)

## 2020-07-19 LAB — CBC WITH DIFFERENTIAL/PLATELET
Abs Immature Granulocytes: 0.05 10*3/uL (ref 0.00–0.07)
Basophils Absolute: 0 10*3/uL (ref 0.0–0.1)
Basophils Relative: 0 %
Eosinophils Absolute: 0 10*3/uL (ref 0.0–0.5)
Eosinophils Relative: 0 %
HCT: 42.6 % (ref 39.0–52.0)
Hemoglobin: 14.1 g/dL (ref 13.0–17.0)
Immature Granulocytes: 0 %
Lymphocytes Relative: 8 %
Lymphs Abs: 1.2 10*3/uL (ref 0.7–4.0)
MCH: 29.7 pg (ref 26.0–34.0)
MCHC: 33.1 g/dL (ref 30.0–36.0)
MCV: 89.7 fL (ref 80.0–100.0)
Monocytes Absolute: 0.4 10*3/uL (ref 0.1–1.0)
Monocytes Relative: 3 %
Neutro Abs: 12.6 10*3/uL — ABNORMAL HIGH (ref 1.7–7.7)
Neutrophils Relative %: 89 %
Platelets: 198 10*3/uL (ref 150–400)
RBC: 4.75 MIL/uL (ref 4.22–5.81)
RDW: 13.1 % (ref 11.5–15.5)
WBC: 14.2 10*3/uL — ABNORMAL HIGH (ref 4.0–10.5)
nRBC: 0 % (ref 0.0–0.2)

## 2020-07-19 LAB — BASIC METABOLIC PANEL
Anion gap: 11 (ref 5–15)
BUN: 14 mg/dL (ref 6–20)
CO2: 22 mmol/L (ref 22–32)
Calcium: 8.8 mg/dL — ABNORMAL LOW (ref 8.9–10.3)
Chloride: 103 mmol/L (ref 98–111)
Creatinine, Ser: 0.84 mg/dL (ref 0.61–1.24)
GFR, Estimated: >60 mL/min (ref >60)
Glucose, Bld: 190 mg/dL — ABNORMAL HIGH (ref 70–99)
Potassium: 4 mmol/L (ref 3.5–5.1)
Sodium: 136 mmol/L (ref 135–145)

## 2020-07-19 LAB — PROCALCITONIN: Procalcitonin: <0.1 ng/mL

## 2020-07-19 LAB — PROTIME-INR
INR: 1.4 — ABNORMAL HIGH (ref 0.8–1.2)
Prothrombin Time: 16.5 seconds — ABNORMAL HIGH (ref 11.4–15.2)

## 2020-07-19 LAB — URINE CULTURE: Culture: NO GROWTH

## 2020-07-19 LAB — HIV ANTIBODY (ROUTINE TESTING W REFLEX): HIV Screen 4th Generation wRfx: NONREACTIVE

## 2020-07-19 LAB — MAGNESIUM: Magnesium: 1.9 mg/dL (ref 1.7–2.4)

## 2020-07-19 LAB — CORTISOL-AM, BLOOD: Cortisol - AM: 0.9 ug/dL — ABNORMAL LOW (ref 6.7–22.6)

## 2020-07-19 MED ORDER — AMOXICILLIN-POT CLAVULANATE 875-125 MG PO TABS: 1.0000 | ORAL_TABLET | Freq: Two times a day (BID) | ORAL | 0 refills | Status: AC

## 2020-07-19 MED ORDER — OXYCODONE HCL 5 MG PO TABS
5.0000 mg | ORAL_TABLET | ORAL | 0 refills | Status: DC | PRN
Start: 2020-07-19 — End: 2020-10-28

## 2020-07-19 MED ORDER — INSULIN ASPART 100 UNIT/ML ~~LOC~~ SOLN: 0.0000 [IU] | Freq: Three times a day (TID) | SUBCUTANEOUS | Status: DC

## 2020-07-19 MED ORDER — IBUPROFEN 200 MG PO TABS: 600.0000 mg | ORAL_TABLET | Freq: Three times a day (TID) | ORAL | 2 refills | Status: DC | PRN

## 2020-07-19 NOTE — Progress Notes (Signed)
Progress Note: General Surgery Service   Chief Complaint/Subjective: Rectal pain  Doing well since surgery.  Urinating a lot.  No bowel movement yet.  Sore and burning around incision.  Objective: Vital signs in last 24 hours: Temp:  [97.5 F (36.4 C)-101.7 F (38.7 C)] 97.5 F (36.4 C) (02/01 0300) Pulse Rate:  [25-97] 46 (02/01 0600) Resp:  [13-26] 18 (02/01 0600) BP: (95-142)/(35-78) 142/78 (02/01 0600) SpO2:  [91 %-99 %] 95 % (02/01 0600) Weight:  [112.9 kg] 112.9 kg (01/31 1842)    Intake/Output from previous day: 01/31 0701 - 02/01 0700 In: 2810 [P.O.:440; I.V.:1150.2; IV Piggyback:1219.8] Out: 1950 [NFAOZ:3086] Intake/Output this shift: No intake/output data recorded.  Constitutional: NAD; conversant; no deformities Eyes: Moist conjunctiva; no lid lag; anicteric; PERRL Neck: Trachea midline; no thyromegaly Lungs: Normal respiratory effort; no tactile fremitus CV: RRR; no palpable thrills; no pitting edema GI: Abd soft, non-tender, non-distended; no palpable hepatosplenomegaly Rectal: packing removed, area hemostatic, MSK: Normal range of motion of extremities; no clubbing/cyanosis Psychiatric: Appropriate affect; alert and oriented x3 Lymphatic: No palpable cervical or axillary lymphadenopathy  Lab Results: CBC  Recent Labs    07/18/20 1144 07/19/20 0209  WBC 12.6* 14.2*  HGB 13.3 14.1  HCT 40.7 42.6  PLT 183 198   BMET Recent Labs    07/18/20 1040 07/19/20 0209  NA 139 136  K 3.9 4.0  CL 103 103  CO2 27 22  GLUCOSE 87 190*  BUN 12 14  CREATININE 0.95 0.84  CALCIUM 8.7* 8.8*   PT/INR Recent Labs    07/18/20 0149 07/19/20 0209  LABPROT 12.9 16.5*  INR 1.0 1.4*   ABG No results for input(s): PHART, HCO3 in the last 72 hours.  Invalid input(s): PCO2, PO2  Anti-infectives: Anti-infectives (From admission, onward)   Start     Dose/Rate Route Frequency Ordered Stop   07/18/20 1200  ceFEPIme (MAXIPIME) 2 g in sodium chloride 0.9 % 100 mL  IVPB        2 g 200 mL/hr over 30 Minutes Intravenous Every 8 hours 07/18/20 1026     07/18/20 1200  vancomycin (VANCOCIN) IVPB 1000 mg/200 mL premix        1,000 mg 100 mL/hr over 120 Minutes Intravenous Every 12 hours 07/18/20 1026     07/18/20 1030  ceFEPIme (MAXIPIME) 2 g in sodium chloride 0.9 % 100 mL IVPB  Status:  Discontinued        2 g 200 mL/hr over 30 Minutes Intravenous  Once 07/18/20 1015 07/18/20 1026   07/18/20 1030  metroNIDAZOLE (FLAGYL) IVPB 500 mg        500 mg 100 mL/hr over 60 Minutes Intravenous Every 8 hours 07/18/20 1015     07/18/20 0200  vancomycin (VANCOCIN) IVPB 1000 mg/200 mL premix        1,000 mg 200 mL/hr over 60 Minutes Intravenous  Once 07/18/20 0155 07/18/20 0727   07/18/20 0200  piperacillin-tazobactam (ZOSYN) IVPB 3.375 g        3.375 g 100 mL/hr over 30 Minutes Intravenous  Once 07/18/20 0155 07/18/20 0727      Medications: Scheduled Meds: . Chlorhexidine Gluconate Cloth  6 each Topical Daily  . insulin aspart  0-15 Units Subcutaneous Q4H  . sodium chloride flush  3 mL Intravenous Q12H  . testosterone  5 g Transdermal Daily   Continuous Infusions: . sodium chloride Stopped (07/18/20 0254)  . ceFEPime (MAXIPIME) IV Stopped (07/19/20 0601)  . lactated ringers 125 mL/hr at 07/18/20  1048  . methocarbamol (ROBAXIN) IV    . metronidazole 500 mg (07/19/20 0606)  . vancomycin Stopped (07/19/20 0258)   PRN Meds:.sodium chloride, acetaminophen **OR** acetaminophen, diphenhydrAMINE, methocarbamol (ROBAXIN) IV, morphine injection, ondansetron **OR** ondansetron (ZOFRAN) IV, oxyCODONE  Assessment/Plan:  Cody Sullivan is a 45 year old male s/p incision and drainage of perirectal abscess on 07/19/2019.    Okay for discharge from surgery perspective Keep area clean and dry - recommend frequent showers to keep area clean Packing removed, no need to repack area Okay to shower today Diet as tolerated High fiber diet, colace and milk of magnesia for 3  weeks as he recovers Okay to return to work once pain controlled Tylenol and ibuprofen for pain control   LOS: 1 day     Felicie Morn, MD  [facility] Use [email or web address] to contact on call provider

## 2020-07-19 NOTE — Progress Notes (Signed)
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  Patient underwent CT abdomen/pelvis which revealed a 1.5 cm fluid collection posterior to the rectum/anus likely representing a perianal abscess.  Initially patient was hypotensive with blood pressure in the 80s/50, lactate was elevated at 2.8.  Patient received 2 and half liters of Ringer lactate, vancomycin and Zosyn at [facility] and was transferred to [facility] long hospital for surgical evaluation antibiotics and incision and drainage. Patient underwent incision and drainage of the perirectal abscess on 07/18/2020.  Assessment/Plan:  Principal Problem:   Severe sepsis (HCC) Active Problems:   Hyperlipidemia   Obesity, Class II, BMI 35-39.9   Essential hypertension   Hypogonadism in male   Type 2 diabetes mellitus with diabetic polyneuropathy, without long-term current use of insulin (HCC)  Severe sepsis  Present on admission.  Patient had features of sepsis including leukocytosis, tachycardia, elevated lactate  with perirectal abscess as source of infection on presentation.  Lactate was 2.8 indicating severe sepsis on presentation.  Patient was started on vancomycin and Zosyn and initially received IV fluid bolus.  He was then started on Vanco cefepime and Flagyl.  Patient underwent incision and drainage with drainage of abscess.  Continue IV fluids.  Lactate is still elevated at 2.7.  Cultures showed rare gram-negative rods.  Blood cultures negative in less than 12 hours.  Check lactate in a.m.    Type 2 diabetes mellitus with diabetic polyneuropathy, without long-term current use of  insulin Well-controlled, last A1c 5.9% on 04/29/2020.  Continue sliding-scale insulin Accu-Cheks diabetic diet while in hospital  Hypogonadism in male Follows with endocrinology.  Last seen on 04/29/2020, Last hormone levels per endocrinology were acceptable. Continue AndroGel 4 pumps (5g) daily  Essential hypertension Blood pressure improved at this time.  Continue IV fluids.    Obesity, Class II, BMI 35-39.9  Body mass index is 39.03 kg/m.  Would benefit from weight loss as outpatient.  Hyperlipidemia On Lipitor at home   DVT prophylaxis: SCDs Start: 07/18/20 1016   Code Status: Full code  Family Communication: Spoke with the patient's spouse at bedside and updated her about the clinical condition of the patient.  Status is: Inpatient  Remains inpatient appropriate because:IV treatments appropriate due to intensity of illness or inability to take PO, Inpatient level of care appropriate due to severity of illness and IV antibiotic, sepsis   Dispo: The patient is from: Home              Anticipated d/c is to: Home              Anticipated d/c date is: 1 day, monitor lactate, follow cultures, continue antibiotic.  Transfer patient out of stepdown unit               Patient currently is not medically stable to d/c.   Difficult to place patient No  Consultants:  General surgery  Procedures:  Irrigation and debridement of perirectal access on 07/18/2020  Anti-infectives:  Marland Kitchen Vancomycin metronidazole, cefepime  Anti-infectives (From admission, onward)   Start     Dose/Rate Route Frequency Ordered Stop   07/19/20 0000  amoxicillin-clavulanate (AUGMENTIN) 875-125 MG tablet        1 tablet Oral 2 times daily 07/19/20 0918 07/26/20 2359   07/18/20 1200  ceFEPIme (MAXIPIME) 2 g in sodium chloride 0.9 % 100 mL IVPB        2 g 200 mL/hr over 30 Minutes Intravenous Every 8 hours 07/18/20 1026     07/18/20 1200  vancomycin (VANCOCIN) IVPB 1000 mg/200 mL premix        1,000  mg 100 mL/hr over 120 Minutes Intravenous Every 12 hours 07/18/20 1026     07/18/20 1030  ceFEPIme (MAXIPIME) 2 g in sodium chloride 0.9 % 100 mL IVPB  Status:  Discontinued        2 g 200 mL/hr over 30 Minutes Intravenous  Once 07/18/20 1015 07/18/20 1026   07/18/20 1030  metroNIDAZOLE (FLAGYL) IVPB 500 mg        500 mg 100 mL/hr over 60 Minutes Intravenous Every 8 hours 07/18/20 1015     07/18/20 0200  vancomycin (VANCOCIN) IVPB 1000 mg/200 mL premix        1,000 mg 200 mL/hr over 60 Minutes Intravenous  Once 07/18/20 0155 07/18/20 0727   07/18/20 0200  piperacillin-tazobactam (ZOSYN) IVPB 3.375 g        3.375 g 100 mL/hr over 30 Minutes Intravenous  Once 07/18/20 0155 07/18/20 0727     Subjective: Today, patient was seen and examined at bedside.  Complains of mild pain at the perirectal site.  Patient spouse at bedside who is very concerned about the patient's sepsis.  Seen by General surgery.  Objective: Vitals:   07/19/20 0800 07/19/20 1200  BP:    Pulse: (!) 58 71  Resp: 16 (!) 22  Temp: 98 F (36.7 C) 98.4 F (36.9 C)  SpO2: 99% 98%    Intake/Output Summary (Last 24 hours) at 07/19/2020 1504 Last data filed at 07/19/2020 1416 Gross per 24 hour  Intake 1100.14 ml  Output 1950 ml  Net -849.86 ml   Filed Weights   07/18/20 0145 07/18/20 1842  Weight: 123.4 kg 112.9 kg   Body mass index is 35.71 kg/m.   Physical Exam: GENERAL: Patient is alert awake and oriented. Not in obvious distress.  Obese HENT: No scleral pallor or icterus. Pupils equally reactive to light. Oral mucosa is moist NECK: is supple, no gross swelling noted. CHEST: Clear to auscultation. No crackles or wheezes.  Diminished breath sounds bilaterally. CVS: S1 and S2 heard, no murmur. Regular rate and rhythm.  ABDOMEN: Soft, non-tender, bowel sounds are present.  Perirectal area status post I&D EXTREMITIES: No edema. CNS: Cranial nerves are intact. No focal motor deficits. SKIN: warm and dry without  rashes.  Data Review: I have personally reviewed the following laboratory data and studies,  CBC: Recent Labs  Lab 07/18/20 0149 07/18/20 1144 07/19/20 0209  WBC 17.6* 12.6* 14.2*  NEUTROABS 13.5* 9.3* 12.6*  HGB 15.6 13.3 14.1  HCT 46.1 40.7 42.6  MCV 87.3 90.6 89.7  PLT 275 183 [id number]   Basic Metabolic Panel: Recent Labs  Lab 07/18/20 0149 07/18/20 1040 07/19/20 0209  NA 136 139 136  K 3.7 3.9 4.0  CL 102 103 103  CO2 22 27 22   GLUCOSE 124* 87 190*  BUN 18 12 14   CREATININE 1.17 0.95 0.84  CALCIUM 9.2 8.7* 8.8*  MG  --   --  1.9   Liver Function Tests: Recent Labs  Lab 07/18/20 0149 07/18/20 1040  AST 27 34  ALT 22 35  ALKPHOS 51 51  BILITOT 0.5 1.1  PROT 7.1 6.7  ALBUMIN 4.1 3.9   No results for input(s): LIPASE, AMYLASE in the last 168 hours. No results for input(s): AMMONIA in the last 168 hours. Cardiac Enzymes: No results for input(s): CKTOTAL, CKMB, CKMBINDEX, TROPONINI in the last 168 hours. BNP (last 3 results) Recent Labs    07/18/20 0149  BNP 73.8    ProBNP (last 3 results) No results for input(s): PROBNP in the last 8760 hours.  CBG: Recent Labs  Lab 07/18/20 2020 07/18/20 2350 07/19/20 0332 07/19/20 0811 07/19/20 1218  GLUCAP 161* 165* 183* 116* 152*   Recent Results (from the past 240 hour(s))  Blood Culture (routine x 2)     Status: None (Preliminary result)   Collection Time: 07/18/20  1:50 AM   Specimen: BLOOD  Result Value Ref Range Status   Specimen Description   Final    BLOOD RIGHT ANTECUBITAL Performed at [facility], [address] [place], [place] [address]    Special Requests   Final    BOTTLES DRAWN AEROBIC AND ANAEROBIC Blood Culture adequate volume Performed at [facility], [address] [place], [place] [address]    Culture   Final    NO GROWTH < 12 HOURS Performed at [facility], [address], [place], [place] [address]    Report Status PENDING  Incomplete  SARS  Coronavirus 2 by RT PCR (hospital order, performed in [facility] hospital lab) Nasopharyngeal Nasopharyngeal Swab     Status: None   Collection Time: 07/18/20  1:52 AM   Specimen: Nasopharyngeal Swab  Result Value Ref Range Status   SARS Coronavirus 2 NEGATIVE NEGATIVE Final    Comment: (NOTE) SARS-CoV-2 target nucleic acids are NOT DETECTED.  The SARS-CoV-2 RNA is generally detectable in upper and lower respiratory specimens during the acute phase of infection. The lowest concentration of SARS-CoV-2 viral copies this assay can detect is 250 copies / mL. A negative result does not preclude SARS-CoV-2 infection and should not be used as the sole basis for treatment or other patient management decisions.  A negative result may occur with improper specimen collection / handling, submission of specimen other than nasopharyngeal swab, presence of viral mutation(s) within the areas targeted by this assay, and inadequate number of viral copies (<250 copies / mL). A negative result must be combined with clinical observations, patient history, and epidemiological information.  Fact Sheet for Patients:   BoilerBrush.com.cy  Fact Sheet for Healthcare Providers: https://pope.com/  This test is not yet approved or  cleared by the [place] FDA and has been authorized for detection and/or diagnosis of SARS-CoV-2 by FDA under an Emergency Use Authorization (EUA).  This EUA will remain in effect (meaning this test can be used) for the duration of the COVID-19 declaration under Section 564(b)(1) of the Act, 21 U.S.C. section 360bbb-3(b)(1), unless the authorization is terminated or revoked sooner.  Performed at [facility], [address] [place], [place] [address]   Blood Culture (routine x 2)     Status: None (Preliminary result)   Collection Time: 07/18/20  2:00 AM   Specimen: BLOOD RIGHT FOREARM  Result Value Ref Range Status    Specimen Description   Final    BLOOD RIGHT FOREARM Performed at [facility], [address], [place], [place] [address]    Special Requests   Final  BOTTLES DRAWN AEROBIC AND ANAEROBIC Blood Culture adequate volume Performed at [facility], [address] [place], [place] [address]    Culture   Final    NO GROWTH < 12 HOURS Performed at [facility] [address], [place], [place] [address]    Report Status PENDING  Incomplete  Urine culture     Status: None   Collection Time: 07/18/20  4:25 AM   Specimen: In/Out Cath Urine  Result Value Ref Range Status   Specimen Description   Final    IN/OUT CATH URINE Performed at [facility], [address] [place], [place] [address]    Special Requests   Final    NONE Performed at [facility], [address] [place], [place] [address]    Culture   Final    NO GROWTH Performed at [facility], [place] [address] [place], [place] [address]    Report Status 07/19/2020 FINAL  Final  Aerobic/Anaerobic Culture (surgical/deep wound)     Status: None (Preliminary result)   Collection Time: 07/18/20  2:23 PM   Specimen: Wound; Abscess  Result Value Ref Range Status   Specimen Description   Final    ABSCESS PERIRECTAL Performed at [facility] [address] [place], [place] [address]    Special Requests   Final    NONE Performed at [facility], [place] [address] [place], [place] [address]    Gram Stain   Final    MODERATE WBC PRESENT, PREDOMINANTLY PMN MODERATE GRAM NEGATIVE RODS    Culture   Final    RARE ESCHERICHIA COLI CULTURE REINCUBATED FOR BETTER GROWTH SUSCEPTIBILITIES TO FOLLOW Performed at [facility], [place] [address], [place], [place] [address]    Report Status PENDING  Incomplete  MRSA PCR Screening     Status: None   Collection Time: 07/18/20  6:39 PM   Specimen: Nasal Mucosa; Nasopharyngeal  Result Value  Ref Range Status   MRSA by PCR NEGATIVE NEGATIVE Final    Comment:        The GeneXpert MRSA Assay (FDA approved for NASAL specimens only), is one component of a comprehensive MRSA colonization surveillance program. It is not intended to diagnose MRSA infection nor to guide or monitor treatment for MRSA infections. Performed at [facility], [address] [place], [place] [address]      Studies: CT ABDOMEN PELVIS W CONTRAST  Addendum Date: 07/18/2020   ADDENDUM REPORT: 07/18/2020 04:03 ADDENDUM: Findings in impressions all show mention a 1.5 cm fluid density lesion posterior to the rectum/anus likely representing a tiny perianal abscess. These results were called by telephone at the time of interpretation on 07/18/2020 at 4:03 am to provider [facility] , who verbally acknowledged these results. Electronically Signed   By: Iven Finn M.D.   On: 07/18/2020 04:03   Result Date: 07/18/2020 CLINICAL DATA:  low back and rectal pain that started around 0730 Sunday morning. Also endorses having 2 diarrhea stools today as well as nausea and chills. Low grade temp currently. EXAM: CT ABDOMEN AND PELVIS WITH CONTRAST TECHNIQUE: Multidetector CT imaging of the abdomen and pelvis was performed using the standard protocol following bolus administration of intravenous contrast. CONTRAST:  17mL OMNIPAQUE IOHEXOL 300 MG/ML  SOLN COMPARISON:  CT abdomen/pelvis 08/03/19. FINDINGS: Lower chest: No acute abnormality. Hepatobiliary: No focal liver abnormality is seen. No gallstones, gallbladder wall thickening, or  biliary dilatation. Pancreas: Unremarkable. No pancreatic ductal dilatation or surrounding inflammatory changes. Spleen: Normal in size without focal abnormality. Adrenals/Urinary Tract: No adrenal nodule bilaterally. No nephrolithiasis, no hydronephrosis, and no contour-deforming renal mass. No ureterolithiasis or hydroureter. The urinary bladder is unremarkable. On delayed  imaging, there is no urothelial wall thickening and there are no filling defects in the opacified portions of the bilateral collecting systems or ureters. Stomach/Bowel: Stomach is within normal limits. No evidence of bowel wall thickening or dilatation. Descending colon and sigmoid diverticulosis. Appendix not identified. Vascular/Lymphatic: No significant vascular findings are present. No enlarged abdominal or pelvic lymph nodes. Reproductive: Prostate is unremarkable. Other: No intraperitoneal free fluid. No intraperitoneal free gas. No organized fluid collection. Musculoskeletal: No abdominal wall hernia or abnormality No suspicious lytic or blastic osseous lesions. No acute displaced fracture. Multilevel degenerative changes of the spine. IMPRESSION: Colonic diverticulosis with no definite acute diverticulitis. Electronically Signed: By: Iven Finn M.D. On: 07/18/2020 03:52   DG Chest Port 1 View  Result Date: 07/18/2020 CLINICAL DATA:  Back pain and low-grade fever EXAM: PORTABLE CHEST 1 VIEW COMPARISON:  10/23/2019 FINDINGS: The heart size and mediastinal contours are within normal limits. Both lungs are clear. The visualized skeletal structures are unremarkable. IMPRESSION: No active disease. Electronically Signed   By: Inez Catalina M.D.   On: 07/18/2020 02:07      Flora Lipps, MD  [facility] 07/19/2020  If 7PM-7AM, please contact night-coverage

## 2020-07-19 NOTE — TOC Initial Note (Signed)
Transition of Care [facility]) - Initial/Assessment Note    Patient Details  Name: Cody Sullivan MRN: [id number] Date of Birth: [birth year]  [facility] CM/SW Contact:    Leeroy Cha, RN Phone Number: 07/19/2020, 8:01 AM  Clinical Narrative:                 Date of Surgery: 07/18/2020   Preoperative Diagnosis: PERIRECTAL ABCESS   Postoperative Diagnosis: PERIRECTAL ABCESS   Surgical Procedure: IRRIGATION AND DEBRIDEMENT PERIRECTAL ABSCESS:   PLAN: home with home health if packing needed Expected Discharge Plan: [place] Barriers to Discharge: Continued Medical Work up   Patient Goals and CMS Choice Patient states their goals for this hospitalization and ongoing recovery are:: to go home CMS Medicare.gov Compare Post Acute Care list provided to:: Patient    Expected Discharge Plan and Services Expected Discharge Plan: [place]   Discharge Planning Services: CM Consult   Living arrangements for the past 2 months: Single Family Home                                      Prior Living Arrangements/Services Living arrangements for the past 2 months: Single Family Home Lives with:: Spouse Patient language and need for interpreter reviewed:: Yes Do you feel safe going back to the place where you live?: Yes      Need for Family Participation in Patient Care: Yes (Comment) (wife) Care giver support system in place?: Yes (comment) (wife)   Criminal Activity/Legal Involvement Pertinent to Current Situation/Hospitalization: No - Comment as needed  Activities of Daily Living Home Assistive Devices/Equipment: CBG Meter,CPAP ADL Screening (condition at time of admission) Patient's cognitive ability adequate to safely complete daily activities?: Yes Is the patient deaf or have difficulty hearing?: No Does the patient have difficulty seeing, even when wearing glasses/contacts?: No Does the patient have difficulty  concentrating, remembering, or making decisions?: No Patient able to express need for assistance with ADLs?: Yes Does the patient have difficulty dressing or bathing?: No Independently performs ADLs?: Yes (appropriate for developmental age) Does the patient have difficulty walking or climbing stairs?: Yes Weakness of Legs: Both Weakness of Arms/Hands: None  Permission Sought/Granted                  Emotional Assessment Appearance:: Appears stated age Attitude/Demeanor/Rapport: Engaged Affect (typically observed): Calm (wife) Orientation: : Oriented to Place,Oriented to Self,Oriented to  Time,Oriented to Situation Alcohol / Substance Use: Not Applicable Psych Involvement: No (comment)  Admission diagnosis:  Perirectal abscess [K61.1] Severe sepsis ([place]) [A41.9, R65.20] Patient Active Problem List   Diagnosis Date Noted  . Severe sepsis ([place]) 07/18/2020  . Dyslipidemia 07/23/2019  . Type 2 diabetes mellitus with diabetic polyneuropathy, without long-term current use of insulin ([place]) 04/21/2019  . 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 Hypogonadism in male 12/26/2018  . Type 2 diabetes mellitus without complication, without long-term current use of insulin ([place]) 07/22/2018  . Low testosterone 07/22/2018  . Pituitary microadenoma ([place]) 07/22/2018  . 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 Essential hypertension 06/04/2018  . HTN, goal below 130/80 02/25/2013  . Hyperlipidemia 02/25/2013  . Poison ivy dermatitis 11/19/2012  . Obesity, Class II, BMI 35-39.9 03/26/2012  . Allergic rhinitis 10/19/2009   PCP:  Kristen Loader, FNP Pharmacy:   [id number] - [place], [place] - [address]  [address] [place] [address] Phone: [phone] Fax: [phone]     Social Determinants of Health (SDOH) Interventions    Readmission Risk Interventions No flowsheet data found.

## 2020-07-20 LAB — BASIC METABOLIC PANEL
Anion gap: 9 (ref 5–15)
BUN: 16 mg/dL (ref 6–20)
CO2: 22 mmol/L (ref 22–32)
Calcium: 8.2 mg/dL — ABNORMAL LOW (ref 8.9–10.3)
Chloride: 108 mmol/L (ref 98–111)
Creatinine, Ser: 0.82 mg/dL (ref 0.61–1.24)
GFR, Estimated: >60 mL/min (ref >60)
Glucose, Bld: 119 mg/dL — ABNORMAL HIGH (ref 70–99)
Potassium: 3.9 mmol/L (ref 3.5–5.1)
Sodium: 139 mmol/L (ref 135–145)

## 2020-07-20 LAB — CBC WITH DIFFERENTIAL/PLATELET
Abs Immature Granulocytes: 0.08 10*3/uL — ABNORMAL HIGH (ref 0.00–0.07)
Basophils Absolute: 0 10*3/uL (ref 0.0–0.1)
Basophils Relative: 0 %
Eosinophils Absolute: 0 10*3/uL (ref 0.0–0.5)
Eosinophils Relative: 0 %
HCT: 37.5 % — ABNORMAL LOW (ref 39.0–52.0)
Hemoglobin: 12.1 g/dL — ABNORMAL LOW (ref 13.0–17.0)
Immature Granulocytes: 1 %
Lymphocytes Relative: 22 %
Lymphs Abs: 3.5 10*3/uL (ref 0.7–4.0)
MCH: 29.3 pg (ref 26.0–34.0)
MCHC: 32.3 g/dL (ref 30.0–36.0)
MCV: 90.8 fL (ref 80.0–100.0)
Monocytes Absolute: 1.1 10*3/uL — ABNORMAL HIGH (ref 0.1–1.0)
Monocytes Relative: 7 %
Neutro Abs: 11.4 10*3/uL — ABNORMAL HIGH (ref 1.7–7.7)
Neutrophils Relative %: 70 %
Platelets: 212 10*3/uL (ref 150–400)
RBC: 4.13 MIL/uL — ABNORMAL LOW (ref 4.22–5.81)
RDW: 13.6 % (ref 11.5–15.5)
WBC: 16.2 10*3/uL — ABNORMAL HIGH (ref 4.0–10.5)
nRBC: 0 % (ref 0.0–0.2)

## 2020-07-20 LAB — LACTIC ACID, PLASMA: Lactic Acid, Venous: 1.8 mmol/L (ref 0.5–1.9)

## 2020-07-20 LAB — MAGNESIUM: Magnesium: 2 mg/dL (ref 1.7–2.4)

## 2020-07-20 LAB — GLUCOSE, CAPILLARY: Glucose-Capillary: 89 mg/dL (ref 70–99)

## 2020-07-20 MED ORDER — BISACODYL 10 MG RE SUPP
10.0000 mg | Freq: Once | RECTAL | Status: AC
  Administered 2020-07-20: 10 mg via RECTAL
  Filled 2020-07-20: qty 1

## 2020-07-20 MED ORDER — DOCUSATE SODIUM 100 MG PO CAPS
100.0000 mg | ORAL_CAPSULE | Freq: Two times a day (BID) | ORAL | Status: DC
  Administered 2020-07-20: 100 mg via ORAL
  Filled 2020-07-20: qty 1

## 2020-07-20 MED ORDER — DOCUSATE SODIUM 100 MG PO CAPS: 100.0000 mg | ORAL_CAPSULE | Freq: Two times a day (BID) | ORAL | 0 refills | Status: DC

## 2020-07-20 MED ORDER — MILK OF MAGNESIA 7.75 % PO SUSP: 30.0000 mL | Freq: Every day | ORAL | 3 refills | Status: AC | PRN

## 2020-07-20 MED ORDER — BISACODYL 10 MG RE SUPP: 10.0000 mg | Freq: Every day | RECTAL | 0 refills | Status: DC | PRN

## 2020-07-20 MED ORDER — POLYETHYLENE GLYCOL 3350 17 G PO PACK: 17.0000 g | PACK | Freq: Every day | ORAL | 0 refills | Status: DC

## 2020-07-20 MED ORDER — POLYETHYLENE GLYCOL 3350 17 G PO PACK
17.0000 g | PACK | Freq: Every day | ORAL | Status: DC
  Administered 2020-07-20: 17 g via ORAL
  Filled 2020-07-20: qty 1

## 2020-07-20 NOTE — Plan of Care (Signed)
Patient discharged home in stable condition 

## 2020-07-20 NOTE — Discharge Instructions (Addendum)
Take antibiotics as prescribed  Recommend shower/ sitz baths at home, daily and after a bowel movement Continue bowel regimen (ie miralax, colace, dulcolax suppository if needed) to avoid constipation Call with concerns (ie increased pain/swelling at surgical site, fever, chills...) Out of work until follow up, but if pain improves prior to your appointment and you want to go back to work sooner then call our office.   How to Take a [employer] A sitz bath is a warm water bath that may be used to care for your rectum, genital area, or the area between your rectum and genitals (perineum). In a sitz bath, the water only comes up to your hips and covers your buttocks. A sitz bath may be done in a bathtub or with a portable sitz bath that fits over the toilet. Your health care provider may recommend a sitz bath to help:  Relieve pain and discomfort after delivering a baby.  Relieve pain and itching from hemorrhoids or anal fissures.  Relieve pain after certain surgeries.  Relax muscles that are sore or tight. How to take a sitz bath Take 3-4 sitz baths a day, or as many as told by your health care provider. Bathtub sitz bath To take a sitz bath in a bathtub: 1. Partially fill a bathtub with warm water. The water should be deep enough to cover your hips and buttocks when you are sitting in the tub. 2. Follow your health care provider's instructions if you are told to put medicine in the water. 3. Sit in the water. Open the tub drain a little, and leave it open during your bath. 4. Turn on the warm water again, enough to replace the water that is draining out. Keep the water running throughout your bath. This helps keep the water at the right level and temperature. 5. Soak in the water for 15-20 minutes, or as long as told by your health care provider. 6. When you are done, be careful when you stand up. You may feel dizzy. 7. After the sitz bath, pat yourself dry. Do not rub your skin to dry it.    Over-the-toilet sitz bath To take a sitz bath with an over-the-toilet basin: 1. Follow the manufacturer's instructions. 2. Fill the basin with warm water. 3. Follow your health care provider's instructions if you were told to put medicine in the water. 4. Sit on the seat. Make sure the water covers your buttocks and perineum. 5. Soak in the water for 15-20 minutes, or as long as told by your health care provider. 6. After the sitz bath, pat yourself dry. Do not rub your skin to dry it. 7. Clean and dry the basin between uses. 8. Discard the basin if it cracks, or according to the manufacturer's instructions.   Contact a health care provider if:  Your pain or itching gets worse. Do not continue with sitz baths if your symptoms get worse.  You have new symptoms. Do not continue with sitz baths until you talk with your health care provider. Summary  A sitz bath is a warm water bath in which the water only comes up to your hips and covers your buttocks.  A sitz bath may help relieve pain and discomfort after delivering a baby. It also may help with pain and itching from hemorrhoids or anal fissures, or pain after certain surgeries. It can also help to relax muscles that are sore or tight.  Take 3-4 sitz baths a day, or as many as told  by your health care provider. Soak in the water for 15-20 minutes.  Do not continue with sitz baths if your symptoms get worse. This information is not intended to replace advice given to you by your health care provider. Make sure you discuss any questions you have with your health care provider. Document Revised: 02/18/2020 Document Reviewed: 02/18/2020 Elsevier Patient Education  2021 [employer].

## 2020-07-20 NOTE — Discharge Summary (Signed)
Physician Discharge Summary  Cody Sullivan DIY:[id number] DOB: 04-Oct-1975 DOA: 07/18/2020  PCP: Kristen Loader, FNP  Admit date: 07/18/2020 Discharge date: 07/20/2020  Admitted From: Home  Discharge disposition: Home  Recommendations for Outpatient Follow-Up:   . Follow up with your primary care provider in 1 week.   . Follow-up with general surgery as scheduled.  Discharge Diagnosis:   Principal Problem:   Severe sepsis (HCC) Active Problems:   Hyperlipidemia   Obesity, Class II, BMI 35-39.9   Essential hypertension   Hypogonadism in male   Type 2 diabetes mellitus with diabetic polyneuropathy, without long-term current use of insulin ([place])   Discharge Condition: Improved.  Diet recommendation: Low sodium, heart healthy.  Carbohydrate-modified.   Wound care: None.  Code status: Full.   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  Patient underwent CT abdomen/pelvis which revealed a 1.5 cm fluid collection posterior to the rectum/anus likely representing a perianal abscess.  Initially patient was hypotensive with blood pressure in the 80s/50, lactate was elevated at 2.8.  Patient received 2 and half liters of Ringer lactate, vancomycin and Zosyn at [facility] and was transferred to [facility] long hospital for surgical evaluation antibiotics and incision and drainage. Patient underwent incision and drainage of the perirectal abscess on 07/18/2020.   Hospital Course:   Following conditions were addressed during hospitalization as listed below,  Severe sepsis  secondary to perirectal abscess Present on admission.  Patient had features of sepsis including leukocytosis, tachycardia, elevated lactate  with perirectal  abscess as source of infection on presentation.  Lactate was 2.8 indicating severe sepsis on presentation.  Patient was started on vancomycin and Zosyn and initially received IV fluid bolus.  He was then started on Vanco, cefepime and Flagyl.  Patient underwent incision and drainage with drainage of abscess.  Blood cultures negative.  Patient has been recommended Augmentin on discharge to complete the course .  Wound culture with rare E. coli.  Type 2 diabetes mellitus with diabetic polyneuropathy, without long-term current use of insulin Well-controlled, last A1c 5.9% on 04/29/2020.    Not on medication at home, continue diabetic diet.  Hypogonadism in male Follows with endocrinology. Last seen on 04/29/2020, Last hormone levels per endocrinology were acceptable. Continue AndroGel 4pumps (5g)daily  Essential hypertension Blood pressure improved at this time.    Resume lisinopril hydrochlorothiazide at home  Obesity, Class II, BMI 35-39.9 Body mass index is 39.03 kg/m.  Would benefit from weight loss as outpatient.  Hyperlipidemia On Lipitor at home  Disposition.  At this time, patient is stable for disposition home with outpatient PCP/ general surgery follow-up  Medical Consultants:    General surgery  Procedures:     Irrigation and debridement of perirectal access on 07/18/2020  Subjective:   Today, patient was seen and examined at bedside.  Mild soreness at the perirectal site.  No fever chills or rigor.  Discharge Exam:   Vitals:   07/20/20 0614 07/20/20 0934  BP: (!) 115/58 126/77  Pulse: 88 91  Resp: 18 16  Temp: 98.2 F (36.8 C) 97.9 F (36.6 C)  SpO2: 97% 95%   Vitals:   07/19/20 2110 07/20/20 0115 07/20/20 0614 07/20/20 0934  BP: 131/76 110/60 (!) 115/58 126/77  Pulse: 63 84 88 91  Resp: _0 Temp: (!) 97.5 F (36.4 C) 98.2 F (36.8 C) 98.2 F (36.8 C) 97.9 F (36.6 C)  TempSrc: Oral Oral Oral Oral  SpO2: 98% 95% 97% 95%  Weight:       Height:       General: Alert awake, not in obvious distress, obese, HENT: pupils equally reacting to light,  No scleral pallor or icterus noted. Oral mucosa is moist.  Chest:  Clear breath sounds.  Diminished breath sounds bilaterally. No crackles or wheezes.  CVS: S1 &S2 heard. No murmur.  Regular rate and rhythm. Abdomen: Soft, nontender, nondistended.  Bowel sounds are heard.  Perirectal area status post I&D without surrounding induration or cellulitis. Extremities: No cyanosis, clubbing or edema.  Peripheral pulses are palpable. Psych: Alert, awake and oriented, normal mood CNS:  No cranial nerve deficits.  Power equal in all extremities.   Skin: Warm and dry.    The results of significant diagnostics from this hospitalization (including imaging, microbiology, ancillary and laboratory) are listed below for reference.     Diagnostic Studies:   CT ABDOMEN PELVIS W CONTRAST  Addendum Date: 07/18/2020   ADDENDUM REPORT: 07/18/2020 04:03 ADDENDUM: Findings in impressions all show mention a 1.5 cm fluid density lesion posterior to the rectum/anus likely representing a tiny perianal abscess. These results were called by telephone at the time of interpretation on 07/18/2020 at 4:03 am to provider [facility] , who verbally acknowledged these results. Electronically Signed   By: Iven Finn M.D.   On: 07/18/2020 04:03   Result Date: 07/18/2020 CLINICAL DATA:  low back and rectal pain that started around 0730 Sunday morning. Also endorses having 2 diarrhea stools today as well as nausea and chills. Low grade temp currently. EXAM: CT ABDOMEN AND PELVIS WITH CONTRAST TECHNIQUE: Multidetector CT imaging of the abdomen and pelvis was performed using the standard protocol following bolus administration of intravenous contrast. CONTRAST:  183m OMNIPAQUE IOHEXOL 300 MG/ML  SOLN COMPARISON:  CT abdomen/pelvis 08/03/19. FINDINGS: Lower chest: No acute abnormality. Hepatobiliary: No focal liver  abnormality is seen. No gallstones, gallbladder wall thickening, or biliary dilatation. Pancreas: Unremarkable. No pancreatic ductal dilatation or surrounding inflammatory changes. Spleen: Normal in size without focal abnormality. Adrenals/Urinary Tract: No adrenal nodule bilaterally. No nephrolithiasis, no hydronephrosis, and no contour-deforming renal mass. No ureterolithiasis or hydroureter. The urinary bladder is unremarkable. On delayed imaging, there is no urothelial wall thickening and there are no filling defects in the opacified portions of the bilateral collecting systems or ureters. Stomach/Bowel: Stomach is within normal limits. No evidence of bowel wall thickening or dilatation. Descending colon and sigmoid diverticulosis. Appendix not identified. Vascular/Lymphatic: No significant vascular findings are present. No enlarged abdominal or pelvic lymph nodes. Reproductive: Prostate is unremarkable. Other: No intraperitoneal free fluid. No intraperitoneal free gas. No organized fluid collection. Musculoskeletal: No abdominal wall hernia or abnormality No suspicious lytic or blastic osseous lesions. No acute displaced fracture. Multilevel degenerative changes of the spine. IMPRESSION: Colonic diverticulosis with no definite acute diverticulitis. Electronically Signed: By: MIven FinnM.D. On: 07/18/2020 03:52   DG Chest Port 1 View  Result Date: 07/18/2020 CLINICAL DATA:  Back pain and low-grade fever EXAM: PORTABLE CHEST 1 VIEW COMPARISON:  10/23/2019 FINDINGS: The heart size and mediastinal contours are within normal limits. Both lungs are clear. The visualized skeletal structures are unremarkable. IMPRESSION: No active disease. Electronically Signed   By: MInez CatalinaM.D.   On: 07/18/2020  02:07    Labs:   Basic Metabolic Panel: Recent Labs  Lab 07/18/20 0149 07/18/20 1040 07/19/20 0209 07/20/20 0332  NA 136 139 136 139  K 3.7 3.9 4.0 3.9  CL 102 103 103 108  CO2 _0 GLUCOSE 124* 87 190* 119*  BUN _1 CREATININE 1.17 0.95 0.84 0.82  CALCIUM 9.2 8.7* 8.8* 8.2*  MG  --   --  1.9 2.0   GFR Estimated Creatinine Clearance: 143.2 mL/min (by C-G formula based on SCr of 0.82 mg/dL). Liver Function Tests: Recent Labs  Lab 07/18/20 0149 07/18/20 1040  AST 27 34  ALT 22 35  ALKPHOS 51 51  BILITOT 0.5 1.1  PROT 7.1 6.7  ALBUMIN 4.1 3.9   No results for input(s): LIPASE, AMYLASE in the last 168 hours. No results for input(s): AMMONIA in the last 168 hours. Coagulation profile Recent Labs  Lab 07/18/20 0149 07/19/20 0209  INR 1.0 1.4*    CBC: Recent Labs  Lab 07/18/20 0149 07/18/20 1144 07/19/20 0209 07/20/20 0332  WBC 17.6* 12.6* 14.2* 16.2*  NEUTROABS 13.5* 9.3* 12.6* 11.4*  HGB 15.6 13.3 14.1 12.1*  HCT 46.1 40.7 42.6 37.5*  MCV 87.3 90.6 89.7 90.8  PLT 275 183 198 212   Cardiac Enzymes: No results for input(s): CKTOTAL, CKMB, CKMBINDEX, TROPONINI in the last 168 hours. BNP: Invalid input(s): POCBNP CBG: Recent Labs  Lab 07/19/20 1218 07/19/20 1633 07/19/20 1949 07/19/20 2113 07/20/20 0722  GLUCAP 152* 87 154* 100* 89   D-Dimer No results for input(s): DDIMER in the last 72 hours. Hgb A1c No results for input(s): HGBA1C in the last 72 hours. Lipid Profile No results for input(s): CHOL, HDL, LDLCALC, TRIG, CHOLHDL, LDLDIRECT in the last 72 hours. Thyroid function studies Recent Labs    07/18/20 1016  TSH 1.431   Anemia work up No results for input(s): VITAMINB12, FOLATE, FERRITIN, TIBC, IRON, RETICCTPCT in the last 72 hours. Microbiology Recent Results (from the past 240 hour(s))  Blood Culture (routine x 2)     Status: None (Preliminary result)   Collection Time: 07/18/20  1:50 AM   Specimen: BLOOD  Result Value Ref Range Status   Specimen Description   Final    BLOOD RIGHT ANTECUBITAL Performed at [facility], [address] [place], [place] [address]    Special Requests   Final     BOTTLES DRAWN AEROBIC AND ANAEROBIC Blood Culture adequate volume Performed at [facility], [address] [place], [place] [address]    Culture   Final    NO GROWTH 2 DAYS Performed at [facility], [place] [address], [place], [place] [address]    Report Status PENDING  Incomplete  SARS Coronavirus 2 by RT PCR (hospital order, performed in [facility] hospital lab) Nasopharyngeal Nasopharyngeal Swab     Status: None   Collection Time: 07/18/20  1:52 AM   Specimen: Nasopharyngeal Swab  Result Value Ref Range Status   SARS Coronavirus 2 NEGATIVE NEGATIVE Final    Comment: (NOTE) SARS-CoV-2 target nucleic acids are NOT DETECTED.  The SARS-CoV-2 RNA is generally detectable in upper and lower respiratory specimens during the acute phase of infection. The lowest concentration of SARS-CoV-2 viral copies this assay can detect is 250 copies / mL. A negative result does not preclude SARS-CoV-2 infection and should not be used as the sole basis for treatment or other patient management decisions.  A negative result may occur with improper specimen collection / handling, submission of specimen other than nasopharyngeal swab, presence of viral mutation(s) within the areas targeted by this assay, and inadequate number of viral copies (<250 copies / mL). A negative result must be combined with clinical observations, patient history, and epidemiological information.  Fact Sheet for Patients:   StrictlyIdeas.no  Fact Sheet for Healthcare Providers: BankingDealers.co.za  This test is not yet approved or  cleared by the [place] FDA and has been authorized for detection and/or diagnosis of SARS-CoV-2 by FDA under an Emergency Use Authorization (EUA).  This EUA will remain in effect (meaning this test can be used) for the duration of the COVID-19 declaration under Section 564(b)(1) of the Act, 21 U.S.C. section 360bbb-3(b)(1),  unless the authorization is terminated or revoked sooner.  Performed at [facility], [address] [place], [place] [address]   Blood Culture (routine x 2)     Status: None (Preliminary result)   Collection Time: 07/18/20  2:00 AM   Specimen: BLOOD RIGHT FOREARM  Result Value Ref Range Status   Specimen Description   Final    BLOOD RIGHT FOREARM Performed at [facility], [address] [place], [place] [address]    Special Requests   Final    BOTTLES DRAWN AEROBIC AND ANAEROBIC Blood Culture adequate volume Performed at [facility], [address] [place], [place] [address]    Culture   Final    NO GROWTH 2 DAYS Performed at [facility], [place] [address], [place], [place] [address]    Report Status PENDING  Incomplete  Urine culture     Status: None   Collection Time: 07/18/20  4:25 AM   Specimen: In/Out Cath Urine  Result Value Ref Range Status   Specimen Description   Final    IN/OUT CATH URINE Performed at [facility], [address] [place], [place] [address]    Special Requests   Final    NONE Performed at [facility], [address] [place], [place] [address]    Culture   Final    NO GROWTH Performed at [facility], [place] [address], [place], [place] [address]    Report Status 07/19/2020 FINAL  Final  Aerobic/Anaerobic Culture (surgical/deep wound)     Status: None (Preliminary result)   Collection Time: 07/18/20  2:23 PM   Specimen: Wound; Abscess  Result Value Ref Range Status   Specimen Description   Final    ABSCESS PERIRECTAL Performed at [place] [address] [place], [place] [address]    Special Requests   Final    NONE Performed at [facility], [place] [address], [place],  [address]    Gram Stain   Final    MODERATE WBC PRESENT, PREDOMINANTLY PMN MODERATE GRAM NEGATIVE RODS    Culture   Final    FEW ESCHERICHIA  COLI HOLDING FOR POSSIBLE ANAEROBE Performed at [facility], [place] [address] [place], [place] [address]    Report Status PENDING  Incomplete   Organism ID, Bacteria ESCHERICHIA COLI  Final      Susceptibility   Escherichia coli - MIC*    AMPICILLIN 4 SENSITIVE Sensitive     CEFAZOLIN <=4 SENSITIVE Sensitive     CEFEPIME <=0.12 SENSITIVE Sensitive     CEFTAZIDIME <=1 SENSITIVE Sensitive     CEFTRIAXONE <=  0.25 SENSITIVE Sensitive     CIPROFLOXACIN <=0.25 SENSITIVE Sensitive     GENTAMICIN <=1 SENSITIVE Sensitive     IMIPENEM <=0.25 SENSITIVE Sensitive     TRIMETH/SULFA <=20 SENSITIVE Sensitive     AMPICILLIN/SULBACTAM <=2 SENSITIVE Sensitive     PIP/TAZO <=4 SENSITIVE Sensitive     * FEW ESCHERICHIA COLI  MRSA PCR Screening     Status: None   Collection Time: 07/18/20  6:39 PM   Specimen: Nasal Mucosa; Nasopharyngeal  Result Value Ref Range Status   MRSA by PCR NEGATIVE NEGATIVE Final    Comment:        The GeneXpert MRSA Assay (FDA approved for NASAL specimens only), is one component of a comprehensive MRSA colonization surveillance program. It is not intended to diagnose MRSA infection nor to guide or monitor treatment for MRSA infections. Performed at [facility], [place] [address], [place], [place] [address]      Discharge Instructions:   Discharge Instructions    Diet - low sodium heart healthy   Complete by: As directed    Discharge instructions   Complete by: As directed    Okay to shower,Okay to return to work once pain controlled,Tylenol and ibuprofen for pain control. Follow up with general surgery  on 08/09/20.   Increase activity slowly   Complete by: As directed    No wound care   Complete by: As directed      Allergies as of 07/20/2020   No Known Allergies     Medication List    STOP taking these medications   HYDROcodone-acetaminophen 5-325 MG tablet Commonly known as: NORCO/VICODIN     TAKE these medications    acetaminophen 500 MG tablet Commonly known as: TYLENOL Take 1,000 mg by mouth every 6 (six) hours as needed for mild pain.   amoxicillin-clavulanate 875-125 MG tablet Commonly known as: Augmentin Take 1 tablet by mouth 2 (two) times daily for 7 days.   atorvastatin 20 MG tablet Commonly known as: LIPITOR Take 1 tablet (20 mg total) by mouth daily.   bisacodyl 10 MG suppository Commonly known as: DULCOLAX Place 1 suppository (10 mg total) rectally daily as needed for moderate constipation.   Contour Next EZ w/Device Kit 1 Device by Does not apply route 2 (two) times daily.   Contour Next Test test strip Generic drug: glucose blood 1 each by Other route See admin instructions. Use as instructed   docusate sodium 100 MG capsule Commonly known as: COLACE Take 1 capsule (100 mg total) by mouth 2 (two) times daily.   ibuprofen 200 MG tablet Commonly known as: Motrin IB Take 3 tablets (600 mg total) by mouth every 8 (eight) hours as needed.   lisinopril-hydrochlorothiazide 20-25 MG tablet Commonly known as: ZESTORETIC TAKE 1 TABLET BY MOUTH EVERY DAY   methocarbamol 500 MG tablet Commonly known as: ROBAXIN Take 500 mg by mouth every 6 (six) hours as needed for muscle spasms.   Microlet Lancets Misc Use to check blood sugars once a day with Contour Next glucometer   Milk of Magnesia 400 MG/5ML suspension Generic drug: magnesium hydroxide Take 30 mLs by mouth daily as needed for up to 21 days for mild constipation or moderate constipation.   oxyCODONE 5 MG immediate release tablet Commonly known as: Oxy IR/ROXICODONE Take 1-2 tablets (5-10 mg total) by mouth every 4 (four) hours as needed for moderate pain.   polyethylene glycol 17 g packet Commonly known as: MIRALAX / GLYCOLAX Take 17 g  by mouth daily.   Testosterone 20.25 MG/ACT (1.62%) Gel 2 PUMPS ON EACH ARM DAILY What changed: See the new instructions.       Follow-up Information    Sur[facility], [facility]  Follow up on 08/09/2020.   Specialty: General Surgery Why: 8:45 am, arrive by 8:15am for paperwork and check in process  Contact information: [address] [place] [address] [phone]        Kristen Loader, FNP. Schedule an appointment as soon as possible for a visit in 2 week(s).   Specialty: Family Medicine Why: regular checkup Contact information: [place] [address] [phone]                Time coordinating discharge: 39 minutes  Signed:  Amoria Mclees  [facility] 07/20/2020, 3:32 PM

## 2020-07-20 NOTE — Progress Notes (Signed)
[facility] Progress Note  2 Days Post-Op  Subjective: CC-  Overall much better. Still sore at surgical site but pain well controlled. Tolerating diet. Passing flatus, no BM since admission.  Objective: Vital signs in last 24 hours: Temp:  [97.5 F (36.4 C)-98.4 F (36.9 C)] 97.9 F (36.6 C) (02/02 0934) Pulse Rate:  [63-91] 91 (02/02 0934) Resp:  [16-27] 16 (02/02 0934) BP: (110-131)/(58-77) 126/77 (02/02 0934) SpO2:  [95 %-98 %] 95 % (02/02 0934) Last BM Date: 07/17/20  Intake/Output from previous day: 02/01 0701 - 02/02 0700 In: 4326 [P.O.:1440; I.V.:2486; IV Piggyback:399.9] Out: 1350 [Urine:1350] Intake/Output this shift: Total I/O In: 240 [P.O.:240] Out: -   PE: Gen:  Alert, NAD, pleasant Pulm:  rate and effort normal Abd: Soft, NT/ND Skin: warm and dry GU: perirectal abscess s/p I&D, incision ~1cm open without any cellulitis or active drainage  Lab Results:  Recent Labs    07/19/20 0209 07/20/20 0332  WBC 14.2* 16.2*  HGB 14.1 12.1*  HCT 42.6 37.5*  PLT 198 212   BMET Recent Labs    07/19/20 0209 07/20/20 0332  NA 136 139  K 4.0 3.9  CL 103 108  CO2 22 22  GLUCOSE 190* 119*  BUN 14 16  CREATININE 0.84 0.82  CALCIUM 8.8* 8.2*   PT/INR Recent Labs    07/18/20 0149 07/19/20 0209  LABPROT 12.9 16.5*  INR 1.0 1.4*   CMP     Component Value Date/Time   NA 139 07/20/2020 0332   K 3.9 07/20/2020 0332   CL 108 07/20/2020 0332   CO2 22 07/20/2020 0332   GLUCOSE 119 (H) 07/20/2020 0332   BUN 16 07/20/2020 0332   CREATININE 0.82 07/20/2020 0332   CREATININE 0.70 12/22/2014 1605   CALCIUM 8.2 (L) 07/20/2020 0332   PROT 6.7 07/18/2020 1040   ALBUMIN 3.9 07/18/2020 1040   AST 34 07/18/2020 1040   ALT 35 07/18/2020 1040   ALKPHOS 51 07/18/2020 1040   BILITOT 1.1 07/18/2020 1040   GFRNONAA >60 07/20/2020 0332   GFRNONAA >89 12/22/2014 1605   GFRAA >60 08/03/2019 0320   GFRAA >89 12/22/2014 1605   Lipase     Component  Value Date/Time   LIPASE 38 08/03/2019 0320       Studies/Results: No results found.  Anti-infectives: Anti-infectives (From admission, onward)   Start     Dose/Rate Route Frequency Ordered Stop   07/19/20 0000  amoxicillin-clavulanate (AUGMENTIN) 875-125 MG tablet        1 tablet Oral 2 times daily 07/19/20 0918 07/26/20 2359   07/18/20 1200  ceFEPIme (MAXIPIME) 2 g in sodium chloride 0.9 % 100 mL IVPB        2 g 200 mL/hr over 30 Minutes Intravenous Every 8 hours 07/18/20 1026     07/18/20 1200  vancomycin (VANCOCIN) IVPB 1000 mg/200 mL premix        1,000 mg 100 mL/hr over 120 Minutes Intravenous Every 12 hours 07/18/20 1026     07/18/20 1030  ceFEPIme (MAXIPIME) 2 g in sodium chloride 0.9 % 100 mL IVPB  Status:  Discontinued        2 g 200 mL/hr over 30 Minutes Intravenous  Once 07/18/20 1015 07/18/20 1026   07/18/20 1030  metroNIDAZOLE (FLAGYL) IVPB 500 mg        500 mg 100 mL/hr over 60 Minutes Intravenous Every 8 hours 07/18/20 1015     07/18/20 0200  vancomycin (VANCOCIN) IVPB 1000 mg/200 mL  premix        1,000 mg 200 mL/hr over 60 Minutes Intravenous  Once 07/18/20 0155 07/18/20 0727   07/18/20 0200  piperacillin-tazobactam (ZOSYN) IVPB 3.375 g        3.375 g 100 mL/hr over 30 Minutes Intravenous  Once 07/18/20 0155 07/18/20 0727       Assessment/Plan HTN HLD BMI 39.03 Hypogonadism ?DM - A1c pending  Perirectal abscess s/p IRRIGATION AND DEBRIDEMENT PERIRECTAL ABSCESS 1/31 Dr. Thermon Leyland  - POD#2 - Cx Rare E coli, reintubated, report pending - Packing removed 2/1. Continue daily showers/sitz baths. Rx for 7 days augmentin sent to pharmacy. Add daily miralax/colace and a dulcolax suppository today.  [place] for discharge from surgical standpoint. Discharge instructions and follow up info on AVS. Work note written for out of work until follow up appointment, but ok to return to work sooner once pain controlled.  ID - zosyn x1, maxipime/vancomycin/flagyl  1/31>> VTE - SCDs, ok for chemical DVT prophylaxis from surgical standpoint FEN - reg diet Foley - none Follow up - [facility] clinic   LOS: 2 days    Wellington Hampshire, [facility] 07/20/2020, 9:36 AM Please see Amion for pager number during day hours 7:00am-4:30pm

## 2020-07-21 LAB — AEROBIC/ANAEROBIC CULTURE W GRAM STAIN (SURGICAL/DEEP WOUND)

## 2020-07-23 LAB — CULTURE, BLOOD (ROUTINE X 2)
Culture: NO GROWTH
Culture: NO GROWTH
Special Requests: ADEQUATE
Special Requests: ADEQUATE

## 2020-07-26 ENCOUNTER — Telehealth: Payer: Self-pay | Admitting: Internal Medicine

## 2020-07-26 MED ORDER — CONTOUR NEXT TEST VI STRP: ORAL_STRIP | 11 refills | Status: AC

## 2020-07-26 NOTE — Telephone Encounter (Signed)
Patient called again re: Pharmacy needs detailed directions for RX for glucose blood (CONTOUR NEXT TEST) test strip per insurance requirements

## 2020-07-26 NOTE — Telephone Encounter (Signed)
Patient called to advise that his pharmacy is not able to fill the contour next test strip RX.  Call back number is [phone]

## 2020-07-26 NOTE — Telephone Encounter (Signed)
I have corrected the direction. Resent Rx to pharmacy.  Message left for patient to return my call.

## 2020-07-29 NOTE — Telephone Encounter (Signed)
Per DPR, left detail message for patient that Rx sent as requested.

## 2020-10-03 ENCOUNTER — Other Ambulatory Visit: Payer: Self-pay | Admitting: Internal Medicine

## 2020-10-20 ENCOUNTER — Other Ambulatory Visit: Payer: Self-pay

## 2020-10-20 ENCOUNTER — Other Ambulatory Visit (INDEPENDENT_AMBULATORY_CARE_PROVIDER_SITE_OTHER): Payer: BC Managed Care – PPO

## 2020-10-20 DIAGNOSIS — E291 Testicular hypofunction: Secondary | ICD-10-CM | POA: Diagnosis not present

## 2020-10-20 DIAGNOSIS — E1142 Type 2 diabetes mellitus with diabetic polyneuropathy: Secondary | ICD-10-CM

## 2020-10-20 DIAGNOSIS — E785 Hyperlipidemia, unspecified: Secondary | ICD-10-CM | POA: Diagnosis not present

## 2020-10-20 LAB — COMPREHENSIVE METABOLIC PANEL
ALT: 23 U/L (ref 0–53)
AST: 24 U/L (ref 0–37)
Albumin: 4.6 g/dL (ref 3.5–5.2)
Alkaline Phosphatase: 53 U/L (ref 39–117)
BUN: 12 mg/dL (ref 6–23)
CO2: 28 mEq/L (ref 19–32)
Calcium: 10 mg/dL (ref 8.4–10.5)
Chloride: 101 mEq/L (ref 96–112)
Creatinine, Ser: 0.99 mg/dL (ref 0.40–1.50)
GFR: 91.56 mL/min (ref >60.00)
Glucose, Bld: 103 mg/dL — ABNORMAL HIGH (ref 70–99)
Potassium: 3.5 mEq/L (ref 3.5–5.1)
Sodium: 138 mEq/L (ref 135–145)
Total Bilirubin: 0.8 mg/dL (ref 0.2–1.2)
Total Protein: 7.7 g/dL (ref 6.0–8.3)

## 2020-10-20 LAB — CBC
HCT: 43.6 % (ref 39.0–52.0)
Hemoglobin: 14.9 g/dL (ref 13.0–17.0)
MCHC: 34.1 g/dL (ref 30.0–36.0)
MCV: 87.7 fl (ref 78.0–100.0)
Platelets: 265 10*3/uL (ref 150.0–400.0)
RBC: 4.97 Mil/uL (ref 4.22–5.81)
RDW: 13.7 % (ref 11.5–15.5)
WBC: 7.6 10*3/uL (ref 4.0–10.5)

## 2020-10-20 LAB — LIPID PANEL
Cholesterol: 163 mg/dL (ref 0–200)
HDL: 37.7 mg/dL — ABNORMAL LOW (ref >39.00)
LDL Cholesterol: 86 mg/dL (ref 0–99)
NonHDL: 125.11
Total CHOL/HDL Ratio: 4
Triglycerides: 195 mg/dL — ABNORMAL HIGH (ref 0.0–149.0)
VLDL: 39 mg/dL (ref 0.0–40.0)

## 2020-10-20 LAB — TESTOSTERONE: Testosterone: 491.7 ng/dL (ref 300.00–890.00)

## 2020-10-20 LAB — HEMOGLOBIN A1C: Hgb A1c MFr Bld: 6 % (ref 4.6–6.5)

## 2020-10-22 ENCOUNTER — Other Ambulatory Visit: Payer: Self-pay | Admitting: Internal Medicine

## 2020-10-24 NOTE — Telephone Encounter (Signed)
Please advise 

## 2020-10-28 ENCOUNTER — Encounter: Payer: Self-pay | Admitting: Internal Medicine

## 2020-10-28 ENCOUNTER — Other Ambulatory Visit: Payer: Self-pay

## 2020-10-28 ENCOUNTER — Ambulatory Visit (INDEPENDENT_AMBULATORY_CARE_PROVIDER_SITE_OTHER): Payer: BC Managed Care – PPO | Admitting: Internal Medicine

## 2020-10-28 VITALS — BP 124/74 | HR 80 | Ht 70.0 in | Wt 271.5 lb

## 2020-10-28 DIAGNOSIS — E1142 Type 2 diabetes mellitus with diabetic polyneuropathy: Secondary | ICD-10-CM | POA: Diagnosis not present

## 2020-10-28 DIAGNOSIS — E291 Testicular hypofunction: Secondary | ICD-10-CM

## 2020-10-28 DIAGNOSIS — D352 Benign neoplasm of pituitary gland: Secondary | ICD-10-CM

## 2020-10-28 DIAGNOSIS — E785 Hyperlipidemia, unspecified: Secondary | ICD-10-CM

## 2020-10-28 LAB — POCT GLUCOSE (DEVICE FOR HOME USE): POC Glucose: 189 mg/dl — AB (ref 70–99)

## 2020-10-28 NOTE — Patient Instructions (Signed)
-   Continue  Androgel ( testosterone ) at FOUR pumps daily  - Continue Atorvastatin 20 mg daily at bedtime     Choose healthy, lower carb lower calorie snacks: toss salad, vegetables, cottage cheese, peanut butter, low fat cheese / string cheese, lower sodium deli meat, tuna salad or chicken salad

## 2020-10-28 NOTE — Progress Notes (Signed)
Name: Cody Sullivan  Age/ Sex: 45 y.o., male   MRN/ DOB: [id number], [birth year]     PCP: Kristen Loader, FNP   Reason for Endocrinology Evaluation: Type 2 Diabetes Mellitus  Initial Endocrine Consultative Visit: 04/10/18    PATIENT IDENTIFIER: [name] is a 45 y.o. male with a past medical history of OSA on CPAP, dyslipidemia and obesity . The patient has followed with Endocrinology clinic since 04/10/18 for consultative assistance with management of his diabetes.  DIABETIC HISTORY:  Cody Sullivan was diagnosed with T2DM in 2019, her was offered Metformin at the time of his diagnosis by his PCP., but the patient opted for diet control and lifestyle changes. His hemoglobin A1c has ranged from 6.3%  in 10/2017, peaking at 6.5% in 03/2018 .   Low Testosterone:   He was found to have a low testosterone in 03/2018 ( T. Testosterone 126 ng/dL) with inappropriately normal LH/FSH. 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   He is S/P Right frontal craniotomy on 08/04/2018 through Dr. Alfred Levins   He has been tired since 2017, he has noted somewhat decrease in libido, no difficulty with erections. He has noted gynecomastia for a while, no pain or discharge from the nipples. He also noted a subjective decrease in testicular size ~ 01/2018. He has 2 biological kids. He has not noticed any decrease in frequency of shaving.  He has chronic occasional headaches.   No known FH  of prostate cancer.   Testosterone started 01/2019  SUBJECTIVE:   Today (10/28/2020): Cody Sullivan is here for afollow up on his diabetes management and hypogonadism. He checks his blood sugars 2 time a week, preprandial to breakfast. The patient has not had hypoglycemic episodes since the last clinic visit.  He was admitted in 06/2020 due to severe sepsis secondary to perirectal abscess he is status post I&D as well as  antibiotics.  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) He admits to inactivity as well as dietary indiscretion while staying home   He is S/P right frontal craniotomy on 08/04/2018 for arachnoid cysts.Headaches stable MRI stable   In term of his low testosterone he has no side effects to the androgel  Denies nausea or diarrhea     HOME ENDOCRINE REGIMEN:  Androgel 4 pumps daily  Atorvastatin 20 mg daily     METER DOWNLOAD SUMMARY: Did not bring      DIABETIC COMPLICATIONS: Microvascular complications:    Denies: Neuropathy, CKD or retinopathy   Last Eye Exam: Completed 2021   Macrovascular complications:    Denies: CAD, CVA, PVD   HISTORY:  Past Medical History:  Past Medical History:  Diagnosis Date  . Allergy   . Diabetes mellitus without complication ([facility])   . Hypertension   . Kidney stones   . Sleep apnea    cpap   Past Surgical History:  Past Surgical History:  Procedure Laterality Date  . ANKLE SURGERY  2003   right   . cracked chest  2007   equipment fell on chest and cracked his chest, had pain related to injury, cardiologist confirmed not related to heart.    . CRANIOTOMY Right 08/04/2018   Right frontal - cyst removal  . INCISION AND DRAINAGE PERIRECTAL ABSCESS N/A 07/18/2020   Procedure: IRRIGATION AND DEBRIDEMENT PERIRECTAL  ABSCESS;  Surgeon: Felicie Morn, MD;  Location: [facility];  Service: General;  Laterality: N/A;  . LITHOTRIPSY  2010  . nose fracture  2000    Social History:  reports that he has never smoked. He has never used smokeless tobacco. He reports that he does not drink alcohol and does not use drugs.  Family History: No family history of prostate ca.    HOME MEDICATIONS: Allergies as of 10/28/2020   No Known Allergies     Medication List       Accurate as of Oct 28, 2020  4:08 PM. If you have any questions, ask your nurse or doctor.        STOP  taking these medications   bisacodyl 10 MG suppository Commonly known as: DULCOLAX Stopped by: Dorita Sciara, MD   docusate sodium 100 MG capsule Commonly known as: COLACE Stopped by: Dorita Sciara, MD   ibuprofen 200 MG tablet Commonly known as: Motrin IB Stopped by: Dorita Sciara, MD   oxyCODONE 5 MG immediate release tablet Commonly known as: Oxy IR/ROXICODONE Stopped by: Dorita Sciara, MD   polyethylene glycol 17 g packet Commonly known as: MIRALAX / GLYCOLAX Stopped by: Dorita Sciara, MD     TAKE these medications   acetaminophen 500 MG tablet Commonly known as: TYLENOL Take 1,000 mg by mouth every 6 (six) hours as needed for mild pain.   atorvastatin 20 MG tablet Commonly known as: LIPITOR TAKE 1 TABLET BY MOUTH EVERY DAY   Contour Next EZ w/Device Kit 1 Device by Does not apply route 2 (two) times daily.   Contour Next Test test strip Generic drug: glucose blood Use as instructed to check blood sugar once daily   lisinopril-hydrochlorothiazide 20-25 MG tablet Commonly known as: ZESTORETIC TAKE 1 TABLET BY MOUTH EVERY DAY   methocarbamol 500 MG tablet Commonly known as: ROBAXIN Take 500 mg by mouth every 6 (six) hours as needed for muscle spasms.   Microlet Lancets Misc Use to check blood sugars once a day with Contour Next glucometer   Testosterone 20.25 MG/ACT (1.62%) Gel APPLY 2 PUMPS ON EACH ARM DAILY         OBJECTIVE:   Vital Signs: BP 124/74   Pulse 80   Ht _0  (1.778 m)   Wt 271 lb 8 oz (123.2 kg)   SpO2 98%   BMI 38.96 kg/m   Wt Readings from Last 3 Encounters:  10/28/20 271 lb 8 oz (123.2 kg)  07/18/20 248 lb 14.4 oz (112.9 kg)  04/29/20 244 lb 4 oz (110.8 kg)     Exam: General: Pt appears well and is in NAD  Lungs: Clear with good BS bilat with no rales, rhonchi, or wheezes  Heart: RRR   Extremities:  Trace edema around ankles  Neuro: MS is good with appropriate affect, pt is alert and  Ox3    DM foot exam: 05/01/2020 The skin of the feet is intact without sores or ulcerations.  The pedal pulses are 2+ on right and 2+ on left. The sensation is absent to a screening 5.07, 10 gram monofilament bilaterally at the toes.           DATA REVIEWED:   Lab Results  Component Value Date   HGBA1C 6.0 10/20/2020   HGBA1C 5.9 (A) 04/29/2020   HGBA1C 5.6 10/23/2019    Results for Cody Sullivan, Cody Sullivan (MRN [id number]) as of 10/28/2020 07:11  Ref. Range 10/20/2020 12:17  Sodium  Latest Ref Range: 135 - 145 mEq/L 138  Potassium Latest Ref Range: 3.5 - 5.1 mEq/L 3.5  Chloride Latest Ref Range: 96 - 112 mEq/L 101  CO2 Latest Ref Range: 19 - 32 mEq/L 28  Glucose Latest Ref Range: 70 - 99 mg/dL 103 (H)  BUN Latest Ref Range: 6 - 23 mg/dL 12  Creatinine Latest Ref Range: 0.40 - 1.50 mg/dL 0.99  Calcium Latest Ref Range: 8.4 - 10.5 mg/dL 10.0  Alkaline Phosphatase Latest Ref Range: 39 - 117 U/L 53  Albumin Latest Ref Range: 3.5 - 5.2 g/dL 4.6  AST Latest Ref Range: 0 - 37 U/L 24  ALT Latest Ref Range: 0 - 53 U/L 23  Total Protein Latest Ref Range: 6.0 - 8.3 g/dL 7.7  Total Bilirubin Latest Ref Range: 0.2 - 1.2 mg/dL 0.8  GFR Latest Ref Range: >60.00 mL/min 91.56  Total CHOL/HDL Ratio Unknown 4  Cholesterol Latest Ref Range: 0 - 200 mg/dL 163  HDL Cholesterol Latest Ref Range: >39.00 mg/dL 37.70 (L)  LDL (calc) Latest Ref Range: 0 - 99 mg/dL 86  NonHDL Unknown 125.11  Triglycerides Latest Ref Range: 0.0 - 149.0 mg/dL 195.0 (H)  VLDL Latest Ref Range: 0.0 - 40.0 mg/dL 39.0  WBC Latest Ref Range: 4.0 - 10.5 K/uL 7.6  RBC Latest Ref Range: 4.22 - 5.81 Mil/uL 4.97  Hemoglobin Latest Ref Range: 13.0 - 17.0 g/dL 14.9  HCT Latest Ref Range: 39.0 - 52.0 % 43.6  MCV Latest Ref Range: 78.0 - 100.0 fl 87.7  MCHC Latest Ref Range: 30.0 - 36.0 g/dL 34.1  RDW Latest Ref Range: 11.5 - 15.5 % 13.7  Platelets Latest Ref Range: 150.0 - 400.0 K/uL 265.0  Hemoglobin A1C Latest Ref Range: 4.6 - 6.5 %  6.0  Testosterone Latest Ref Range: 300.00 - 890.00 ng/dL 491.70   MRI 08/23/2020  08/23/2020 8:20 AM EST  IMPRESSION:   1. No significant change since 08/25/2019.  2. 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  3. Normal MRI of the sella/pituitary gland without and with contrast. ASSESSMENT / PLAN / RECOMMENDATIONS:   1)  Type 2 Diabetes Mellitus, in remission with neuropathy - Most recent A1c of 6.0 %. Goal A1c < 7.0 %.   -Despite recent weight gain his A1c continues to be stable -No changes, emphasized low-carb diet.  Unfortunately he is unable to exercise due to back issues  EDUCATION / INSTRUCTIONS:  BG monitoring instructions: Patient is instructed to check his blood sugars 2 times a week, fasting .  2) Pituitary Microadenoma:   - MRI 08/2020 shows stable right middle arachnoid cyst, no evidence of pituitary microadenoma on this MRI  - Clinically and biochemicaly there's no evidence of hyper or hyposecretion    3) Hypogonadotrophic hypogonadism:    - Testosterone level is optimal - PSA and Hct normal   Medications Continue Androgel  4 pumps daily   4) Dyslipidemia :  - LDL at goal, but triglyceride levels have trended up.  Discussed the importance of weight loss, low-carb and low-fat diet -Continue current dose of atorvastatin  Medication Continue atorvastatin 20 mg QHS   F/U in 6 months    Signed electronically by: Mack Guise, MD  [facility]  [address], [place] [address] Phone: [phone] FAX: [phone]   CC: Kristen Loader, [place] [address] Phone: [phone]  Fax: [phone]  Return to Endocrinology clinic as below:  Future Appointments  Date Time Provider [place]  04/24/2021  8:00 AM [facility] None  05/01/2021  3:40 PM Ikesha Siller, Melanie Crazier, MD [facility] None

## 2020-12-13 IMAGING — CR DG ABDOMEN 1V
2 series · 2 of 2 positions shown · non-contrast
Comparison: CT 01/20/2019

CLINICAL DATA: Abdominal pain. History of stone disease. Vomiting.

EXAM:
ABDOMEN - 1 VIEW

[t abdomen supine (1 of 2)]
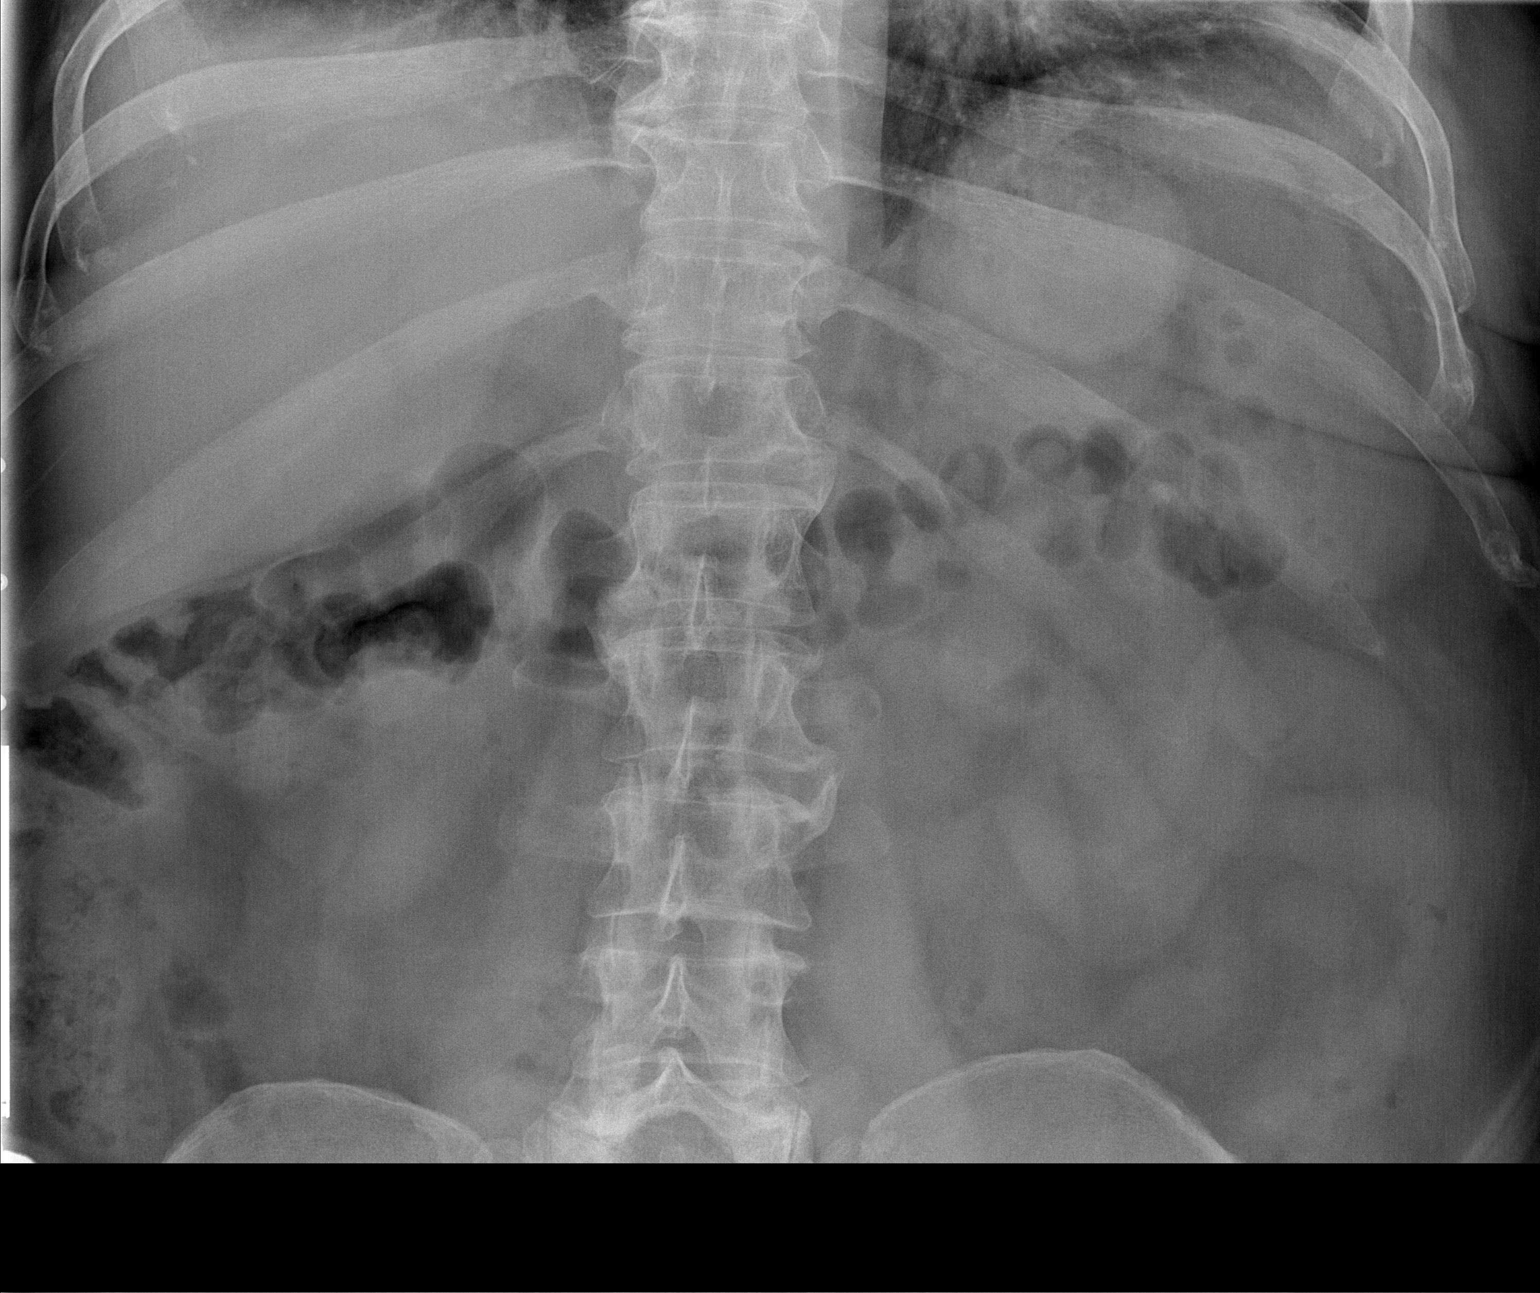

[t abdomen supine (2 of 2)]
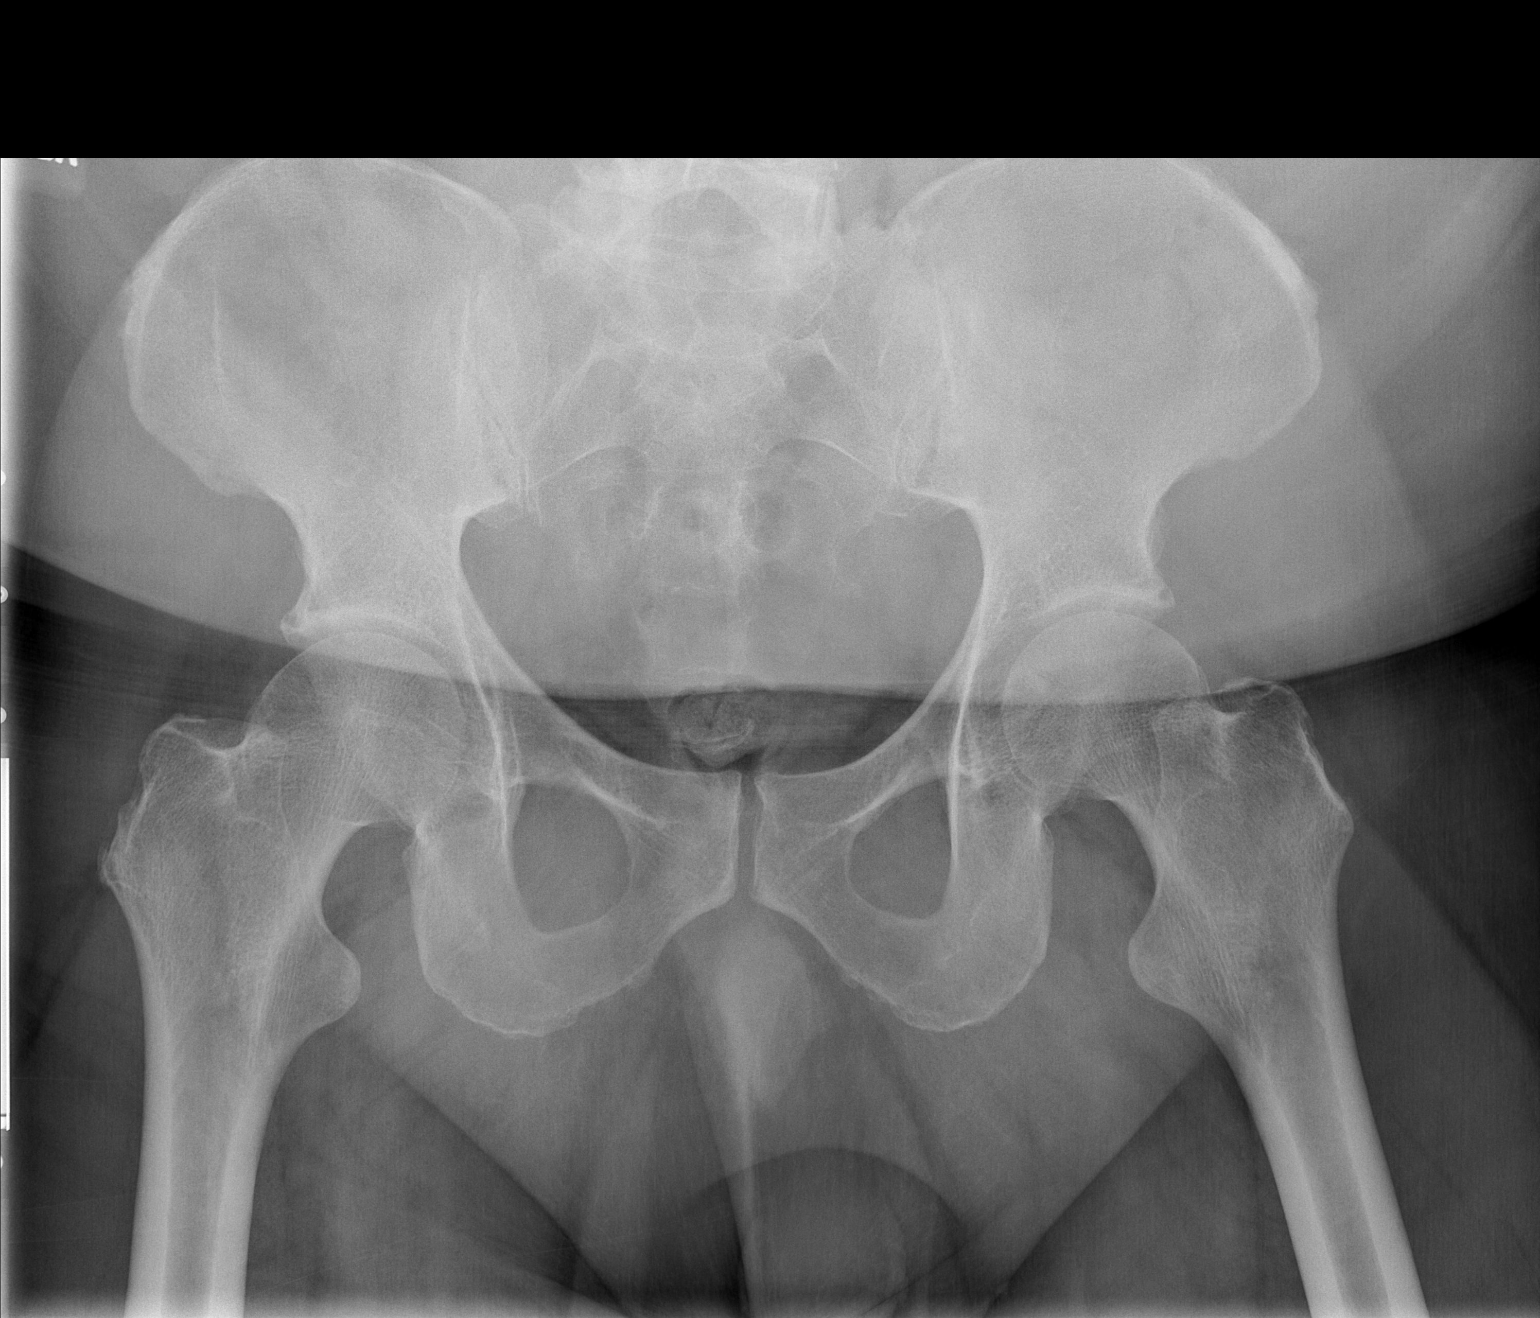

[2 of 2 positions shown; findings below may reference images not displayed]

FINDINGS: Bowel gas pattern is normal. No abnormal abdominal or pelvic
calcifications. Ordinary mild degenerative changes affect the lumbar
spine.
IMPRESSION: No visible urinary tract stone disease.  Bowel gas pattern normal.

## 2021-03-04 IMAGING — DX DG CHEST 1V
2 series · 2 of 2 positions shown · non-contrast
Comparison: Prior chest radiograph 07/25/2017 and earlier

CLINICAL DATA: Physical exam.  Additional provided: Nonsmoker

EXAM:
CHEST  1 VIEW

[chest pa (1 of 2)]
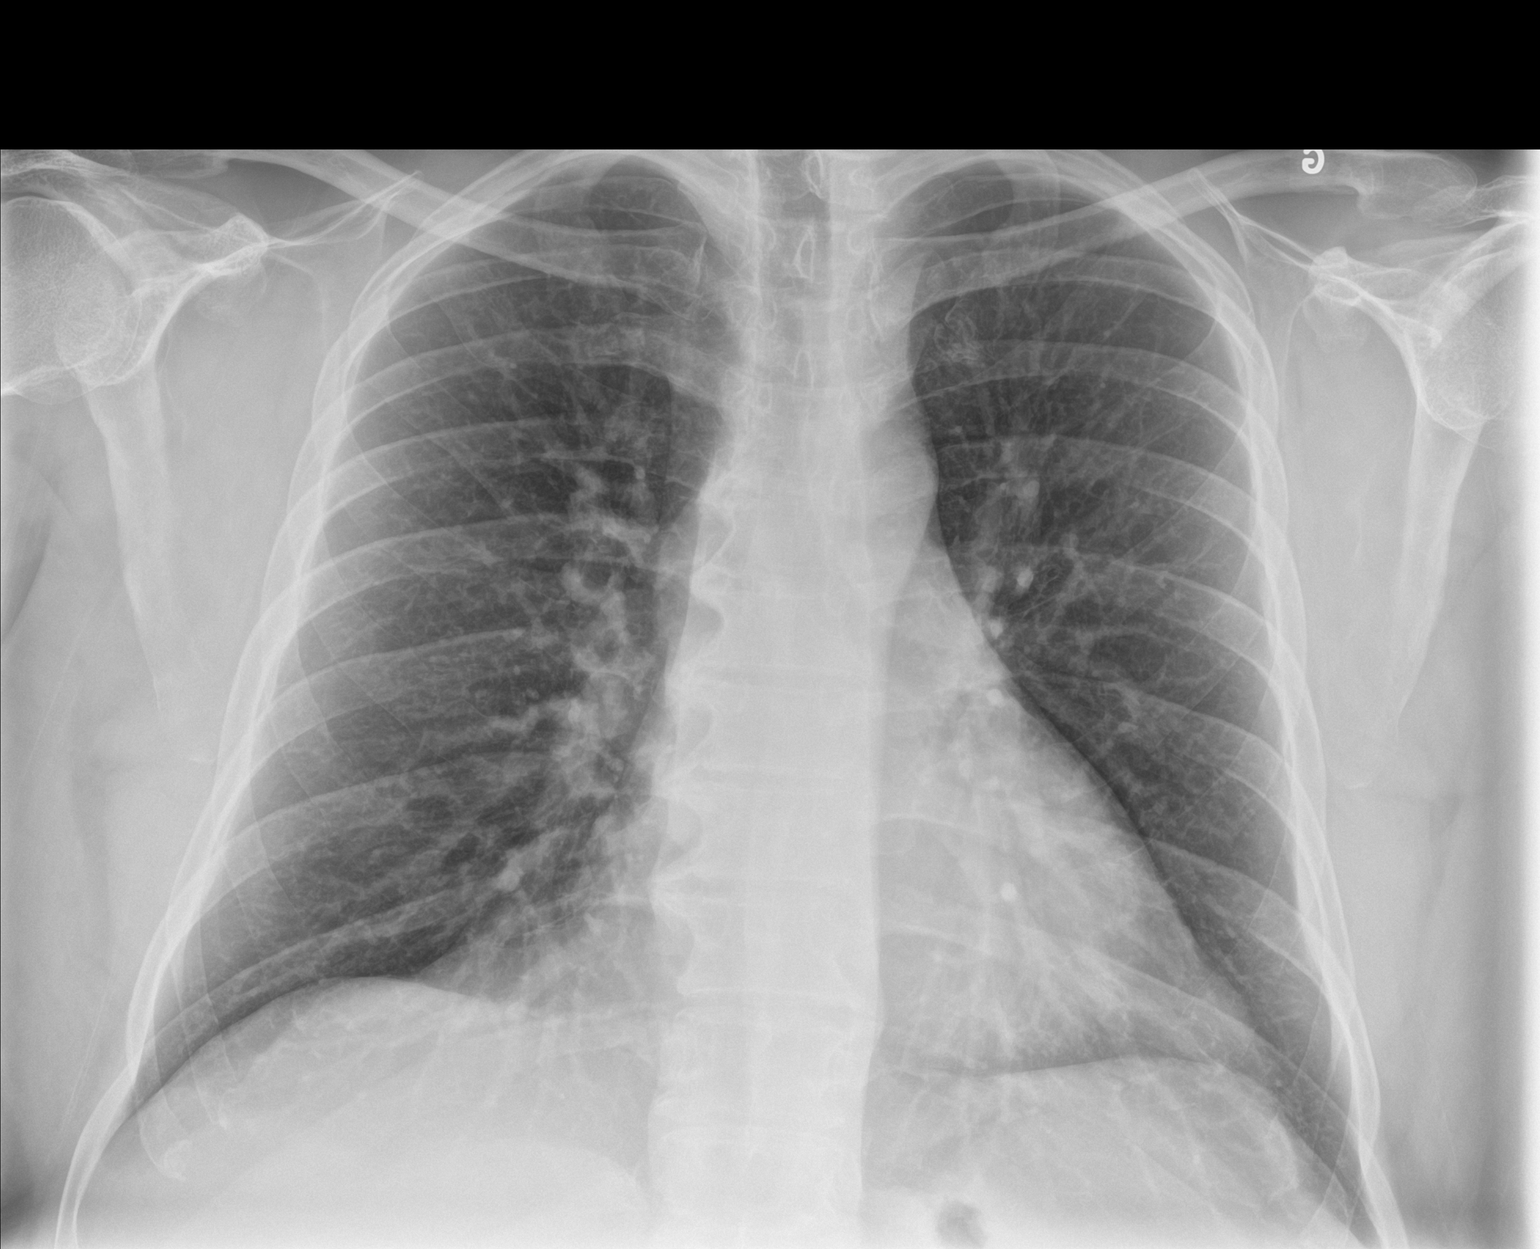

[chest pa (2 of 2)]
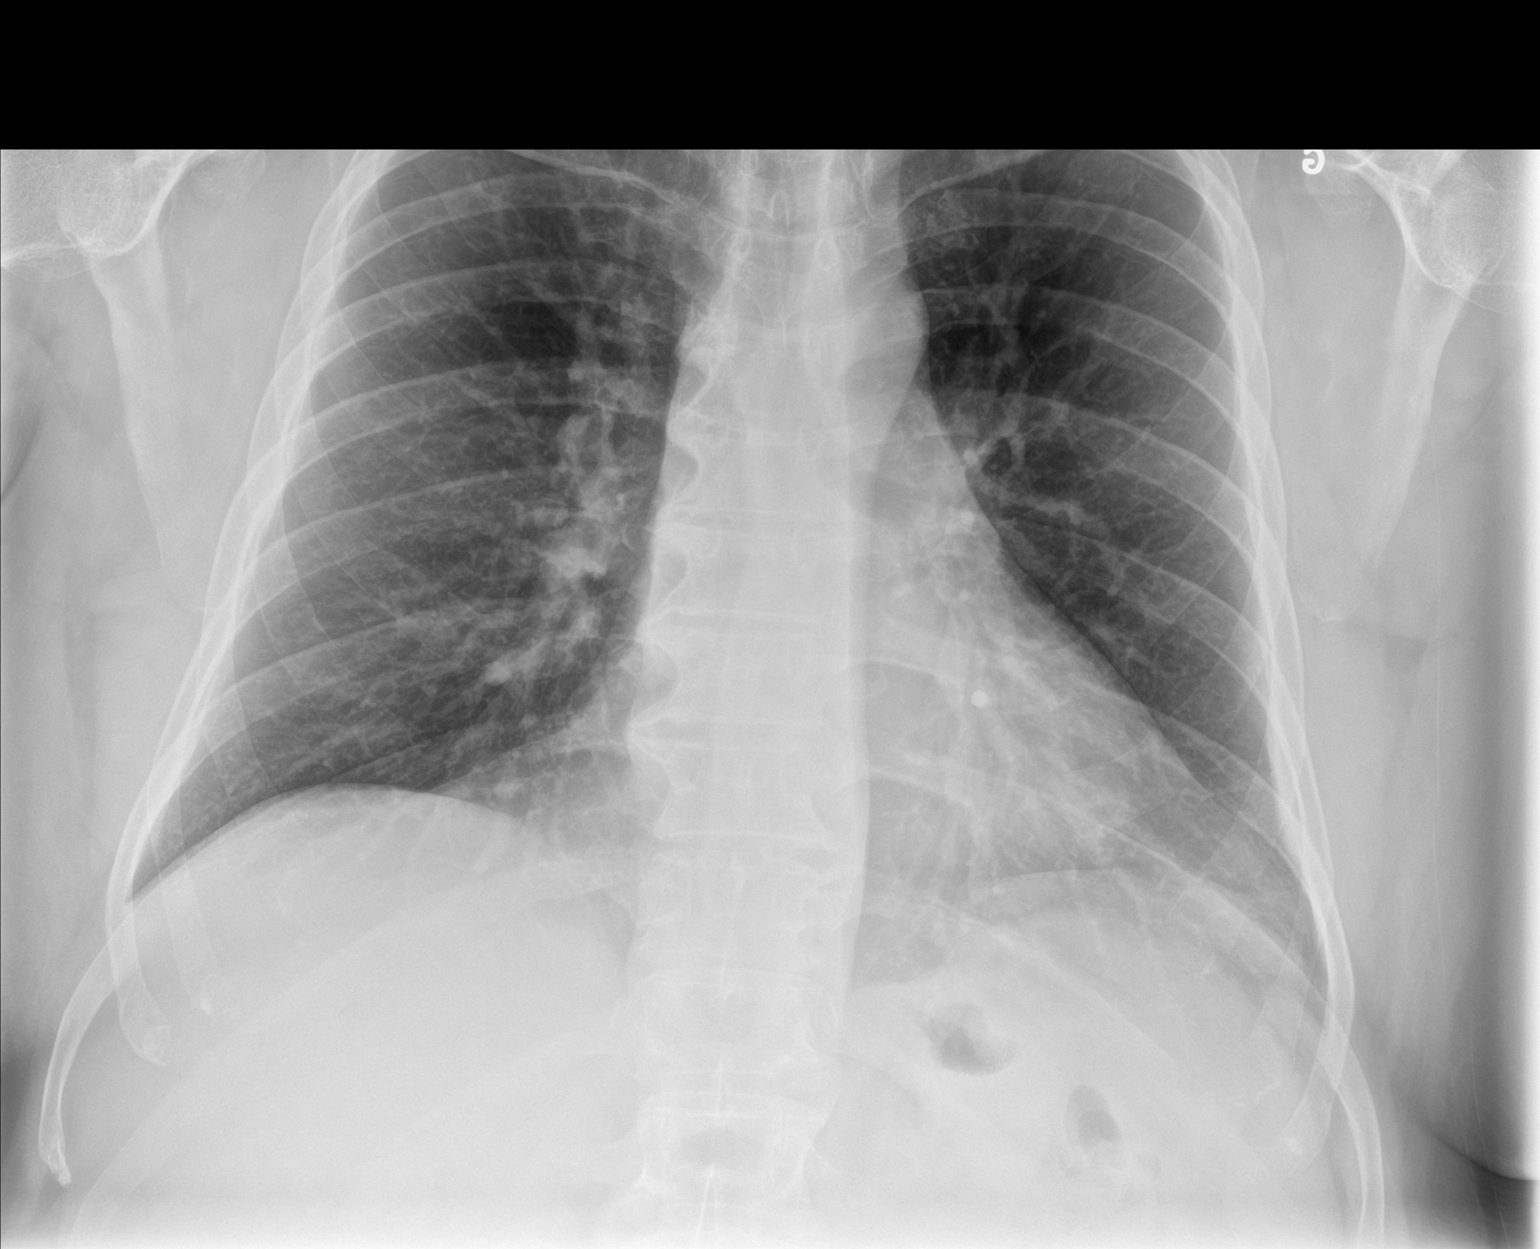

[2 of 2 positions shown; findings below may reference images not displayed]

FINDINGS: Heart size within normal limits.

There is no appreciable airspace consolidation.

No evidence of pleural effusion or pneumothorax.

No acute bony abnormality identified.  Thoracic spondylosis.
IMPRESSION: No evidence of active cardiopulmonary disease.

Thoracic spondylosis.

## 2021-03-13 ENCOUNTER — Telehealth: Payer: Self-pay

## 2021-03-13 NOTE — Telephone Encounter (Signed)
Patient called back, states insurance company said they had sent [place] a prior authorization request form for his testosterone prescription

## 2021-03-13 NOTE — Telephone Encounter (Signed)
Patient called and was advised to contact insurance to find out what alternatives his insurance will cover.

## 2021-03-13 NOTE — Telephone Encounter (Signed)
Vm left for patient to callback and find out what alternatives his insurance will cover.

## 2021-03-13 NOTE — Telephone Encounter (Signed)
Insurance no longer covering. Patient cost is $750. Alternative requested.

## 2021-03-14 ENCOUNTER — Telehealth: Payer: Self-pay | Admitting: Pharmacy Technician

## 2021-03-14 NOTE — Telephone Encounter (Signed)
Patient Advocate Encounter   Received notification from [facility] that prior authorization for TESTORONE GEL is required.   PA submitted on 9/27/ Key  [id number] Status is pending    [facility] will continue to follow   Burney Gauze, CPhT Patient [facility] Phone: [phone] Fax:  [phone]

## 2021-03-20 ENCOUNTER — Other Ambulatory Visit (HOSPITAL_COMMUNITY): Payer: Self-pay

## 2021-03-20 NOTE — Telephone Encounter (Signed)
[facility] Patient Advocate Encounter  Prior Authorization for Testosterone 20.25mg /1.25gm (1.62%) gel has been approved.    PA# [place] [employer] [id number] [phone] YY Effective dates: 03/17/21 through 03/17/22   Armanda Magic, CPhT Patient Advocate [facility] Phone: [phone] Fax:  [phone]

## 2021-03-23 NOTE — Telephone Encounter (Signed)
ERROR

## 2021-04-24 ENCOUNTER — Other Ambulatory Visit (INDEPENDENT_AMBULATORY_CARE_PROVIDER_SITE_OTHER): Payer: BC Managed Care – PPO

## 2021-04-24 ENCOUNTER — Other Ambulatory Visit: Payer: Self-pay

## 2021-04-24 DIAGNOSIS — E785 Hyperlipidemia, unspecified: Secondary | ICD-10-CM

## 2021-04-24 DIAGNOSIS — E291 Testicular hypofunction: Secondary | ICD-10-CM

## 2021-04-24 DIAGNOSIS — E1142 Type 2 diabetes mellitus with diabetic polyneuropathy: Secondary | ICD-10-CM

## 2021-04-24 LAB — COMPREHENSIVE METABOLIC PANEL
ALT: 26 U/L (ref 0–53)
AST: 23 U/L (ref 0–37)
Albumin: 4.4 g/dL (ref 3.5–5.2)
Alkaline Phosphatase: 59 U/L (ref 39–117)
BUN: 12 mg/dL (ref 6–23)
CO2: 27 mEq/L (ref 19–32)
Calcium: 9.7 mg/dL (ref 8.4–10.5)
Chloride: 100 mEq/L (ref 96–112)
Creatinine, Ser: 0.92 mg/dL (ref 0.40–1.50)
GFR: 99.63 mL/min (ref >60.00)
Glucose, Bld: 107 mg/dL — ABNORMAL HIGH (ref 70–99)
Potassium: 4 mEq/L (ref 3.5–5.1)
Sodium: 137 mEq/L (ref 135–145)
Total Bilirubin: 0.6 mg/dL (ref 0.2–1.2)
Total Protein: 7.4 g/dL (ref 6.0–8.3)

## 2021-04-24 LAB — CBC
HCT: 45.3 % (ref 39.0–52.0)
Hemoglobin: 15.1 g/dL (ref 13.0–17.0)
MCHC: 33.3 g/dL (ref 30.0–36.0)
MCV: 88.2 fl (ref 78.0–100.0)
Platelets: 239 10*3/uL (ref 150.0–400.0)
RBC: 5.14 Mil/uL (ref 4.22–5.81)
RDW: 13.5 % (ref 11.5–15.5)
WBC: 9.4 10*3/uL (ref 4.0–10.5)

## 2021-04-24 LAB — LIPID PANEL
Cholesterol: 165 mg/dL (ref 0–200)
HDL: 39.8 mg/dL (ref >39.00)
LDL Cholesterol: 92 mg/dL (ref 0–99)
NonHDL: 125.07
Total CHOL/HDL Ratio: 4
Triglycerides: 165 mg/dL — ABNORMAL HIGH (ref 0.0–149.0)
VLDL: 33 mg/dL (ref 0.0–40.0)

## 2021-04-24 LAB — HEMOGLOBIN A1C: Hgb A1c MFr Bld: 6.5 % (ref 4.6–6.5)

## 2021-04-24 LAB — TESTOSTERONE: Testosterone: 285.41 ng/dL — ABNORMAL LOW (ref 300.00–890.00)

## 2021-05-01 ENCOUNTER — Encounter: Payer: Self-pay | Admitting: Internal Medicine

## 2021-05-01 ENCOUNTER — Other Ambulatory Visit: Payer: Self-pay

## 2021-05-01 ENCOUNTER — Ambulatory Visit (INDEPENDENT_AMBULATORY_CARE_PROVIDER_SITE_OTHER): Payer: BC Managed Care – PPO | Admitting: Internal Medicine

## 2021-05-01 VITALS — BP 118/76 | HR 75 | Ht 70.0 in | Wt 279.0 lb

## 2021-05-01 DIAGNOSIS — E291 Testicular hypofunction: Secondary | ICD-10-CM

## 2021-05-01 DIAGNOSIS — E785 Hyperlipidemia, unspecified: Secondary | ICD-10-CM | POA: Diagnosis not present

## 2021-05-01 DIAGNOSIS — E230 Hypopituitarism: Secondary | ICD-10-CM

## 2021-05-01 DIAGNOSIS — E1142 Type 2 diabetes mellitus with diabetic polyneuropathy: Secondary | ICD-10-CM

## 2021-05-01 DIAGNOSIS — D352 Benign neoplasm of pituitary gland: Secondary | ICD-10-CM | POA: Diagnosis not present

## 2021-05-01 MED ORDER — TESTOSTERONE 20.25 MG/ACT (1.62%) TD GEL
TRANSDERMAL | 5 refills | Status: DC
Start: 2021-05-01 — End: 2021-05-01

## 2021-05-01 MED ORDER — ATORVASTATIN CALCIUM 20 MG PO TABS: 20.0000 mg | ORAL_TABLET | Freq: Every day | ORAL | 3 refills | Status: DC

## 2021-05-01 MED ORDER — TESTOSTERONE 20.25 MG/ACT (1.62%) TD GEL: 81.0000 mg | Freq: Every day | TRANSDERMAL | 1 refills | Status: DC

## 2021-05-01 NOTE — Patient Instructions (Signed)
-   Continue  Androgel ( testosterone ) at FOUR pumps daily  - Continue Atorvastatin 20 mg daily at bedtime

## 2021-05-01 NOTE — Progress Notes (Signed)
Name: Cody Sullivan  Age/ Sex: 45 y.o., male   MRN/ DOB: [id number], October 13, 1975     PCP: Soundra Pilon, FNP   Reason for Endocrinology Evaluation: Type 2 Diabetes Mellitus  Initial Endocrine Consultative Visit: 04/10/18    PATIENT IDENTIFIER: Cody Sullivan is a 45 y.o. male with a past medical history of OSA on CPAP, dyslipidemia and obesity . The patient has followed with Endocrinology clinic since 04/10/18 for consultative assistance with management of his diabetes.  DIABETIC HISTORY:  Cody Sullivan was diagnosed with T2DM in 2019, her was offered Metformin at the time of his diagnosis by his PCP., but the patient opted for diet control and lifestyle changes. His hemoglobin A1c has ranged from 6.3%  in 10/2017, peaking at 6.5% in 03/2018 .   Low Testosterone:   He was found to have a low testosterone in 03/2018 ( T. Testosterone 126 ng/dL) with inappropriately normal LH/FSH. 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   He is S/P Right frontal craniotomy on 08/04/2018 through Dr. Gwendlyn Deutscher   He has been tired since 2017, he has noted somewhat decrease in libido, no difficulty with erections. He has noted gynecomastia for a while, no pain or discharge from the nipples. He also noted a subjective decrease in testicular size ~ 01/2018. He has 2 biological kids. He has not noticed any decrease in frequency of shaving.  He has chronic occasional headaches.     No known FH  of prostate cancer.   Testosterone started 01/2019  SUBJECTIVE:   Today (05/01/2021): Cody Sullivan is here for afollow up on his diabetes management and hypogonadism. He checks his blood sugars 2 time a week, preprandial to breakfast. The patient has not had hypoglycemic episodes since the last clinic visit.  He has been noted with weight gain    He continues to follow up with  Dr. Manson Passey (neurosurgery) for back  issues, PT was not helpful . He is not back to work   Has noted LE edema    He was admitted in 06/2020 due to severe sepsis secondary to perirectal abscess he is status post I&D as well as antibiotics.    He is S/P right frontal craniotomy on 08/04/2018 for arachnoid cysts.Headaches stable MRI stable   In term of his low testosterone he has no side effects to the androgel      HOME ENDOCRINE REGIMEN:  Androgel 4 pumps daily  Atorvastatin 20 mg daily       METER DOWNLOAD SUMMARY: 10/24-11/12/2020  Average Number Tests/Day = 0.3 Overall Mean FS Glucose = 147 Standard Deviation = 6  BG Ranges: Low = 141 High = 153    Hypoglycemic Events/30 Days: BG < 50 = 0 Episodes of symptomatic severe hypoglycemia = 0        DIABETIC COMPLICATIONS: Microvascular complications:   Denies: Neuropathy, CKD or retinopathy  Last Eye Exam: Completed 2021   Macrovascular complications:   Denies: CAD, CVA, PVD   HISTORY:  Past Medical History:  Past Medical History:  Diagnosis Date   Allergy    Diabetes mellitus without complication ([facility])    Hypertension    Kidney stones    Sleep apnea    cpap   Past Surgical History:  Past Surgical History:  Procedure Laterality Date   ANKLE SURGERY  2003   right    cracked chest  2007   equipment fell on chest and cracked his chest, had pain related to injury, cardiologist confirmed not related to heart.     CRANIOTOMY Right 08/04/2018   Right frontal - cyst removal   INCISION AND DRAINAGE PERIRECTAL ABSCESS N/A 07/18/2020   Procedure: IRRIGATION AND DEBRIDEMENT PERIRECTAL ABSCESS;  Surgeon: Felicie Morn, MD;  Location: [facility];  Service: General;  Laterality: N/A;   LITHOTRIPSY  2010   nose fracture  2000   Social History:  reports that he has never smoked. He has never used smokeless tobacco. He reports that he does not drink alcohol and does not use drugs. Family History: No family history of prostate ca.     HOME MEDICATIONS: Allergies as of 05/01/2021   No Known Allergies      Medication List        Accurate as of May 01, 2021  3:48 PM. If you have any questions, ask your nurse or doctor.          acetaminophen 500 MG tablet Commonly known as: TYLENOL Take 1,000 mg by mouth every 6 (six) hours as needed for mild pain.   atorvastatin 20 MG tablet Commonly known as: LIPITOR TAKE 1 TABLET BY MOUTH EVERY DAY   b complex vitamins capsule Take 1 capsule by mouth daily.   Contour Next EZ w/Device Kit 1 Device by Does not apply route 2 (two) times daily.   Contour Next Test test strip Generic drug: glucose blood Use as instructed to check blood sugar once daily   lisinopril-hydrochlorothiazide 20-25 MG tablet Commonly known as: ZESTORETIC TAKE 1 TABLET BY MOUTH EVERY DAY   methocarbamol 500 MG tablet Commonly known as: ROBAXIN Take 500 mg by mouth every 6 (six) hours as needed for muscle spasms.   Microlet Lancets Misc Use to check blood sugars once a day with Contour Next glucometer   Testosterone 20.25 MG/ACT (1.62%) Gel APPLY 2 PUMPS ON EACH ARM DAILY          OBJECTIVE:   Vital Signs: BP 118/76 (BP Location: Left Arm, Patient Position: Sitting, Cuff Size: Large)   Pulse 75   Ht $R'5\' 10"'kU$  (1.778 m)   Wt 279 lb (126.6 kg)   SpO2 97%   BMI 40.03 kg/m   Wt Readings from Last 3 Encounters:  05/01/21 279 lb (126.6 kg)  10/28/20 271 lb 8 oz (123.2 kg)  07/18/20 248 lb 14.4 oz (112.9 kg)     Exam: General: Pt appears well and is in NAD  Lungs: Clear with good BS bilat with no rales, rhonchi, or wheezes  Heart: RRR   Extremities:  Trace edema around ankles  Neuro: MS is good with appropriate affect, pt is alert and Ox3    DM foot exam: 05/01/2021 The skin of the feet is intact without sores or ulcerations.  The pedal pulses are 2+ on right and 2+ on left. The sensation is absent to a screening 5.07, 10 gram monofilament bilaterally at the toes.             DATA REVIEWED:   Lab Results  Component Value Date   HGBA1C 6.5 04/24/2021   HGBA1C 6.0 10/20/2020   HGBA1C 5.9 (A) 04/29/2020   Results for Cody Sullivan, Cody Sullivan (MRN [id number]) as of 05/01/2021 15:43  Ref. Range 04/24/2021 08:19  Sodium Latest Ref Range: 135 - 145 mEq/L 137  Potassium Latest Ref Range: 3.5 - 5.1 mEq/L 4.0  Chloride Latest Ref Range: 96 - 112 mEq/L 100  CO2 Latest Ref Range: 19 - 32 mEq/L 27  Glucose Latest Ref Range: 70 - 99 mg/dL 107 (H)  BUN Latest Ref Range: 6 - 23 mg/dL 12  Creatinine Latest Ref Range: 0.40 - 1.50 mg/dL 0.92  Calcium Latest Ref Range: 8.4 - 10.5 mg/dL 9.7  Alkaline Phosphatase Latest Ref Range: 39 - 117 U/L 59  Albumin Latest Ref Range: 3.5 - 5.2 g/dL 4.4  AST Latest Ref Range: 0 - 37 U/L 23  ALT Latest Ref Range: 0 - 53 U/L 26  Total Protein Latest Ref Range: 6.0 - 8.3 g/dL 7.4  Total Bilirubin Latest Ref Range: 0.2 - 1.2 mg/dL 0.6  GFR Latest Ref Range: >60.00 mL/min 99.63  Total CHOL/HDL Ratio Unknown 4  Cholesterol Latest Ref Range: 0 - 200 mg/dL 165  HDL Cholesterol Latest Ref Range: >39.00 mg/dL 39.80  LDL (calc) Latest Ref Range: 0 - 99 mg/dL 92  NonHDL Unknown 125.07  Triglycerides Latest Ref Range: 0.0 - 149.0 mg/dL 165.0 (H)  VLDL Latest Ref Range: 0.0 - 40.0 mg/dL 33.0    MRI 08/23/2020  08/23/2020 8:20 AM EST   IMPRESSION:   1. No significant change since 08/25/2019.  2. 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  3. Normal MRI of the sella/pituitary gland without and with contrast. ASSESSMENT / PLAN / RECOMMENDATIONS:   1)  Type 2 Diabetes Mellitus, in remission with neuropathy - Most recent A1c of 6.5 %. Goal A1c < 7.0 %.    -A1c continues at goal but trending up  - [facility].  Unfortunately he is unable to exercise due to back issues  EDUCATION / INSTRUCTIONS: BG monitoring instructions: Patient is instructed to check his blood sugars 2 times a week, fasting .     2)  Pituitary Microadenoma:   - MRI 08/2020 shows stable right middle arachnoid cyst, no evidence of pituitary microadenoma on this MRI  - Clinically and biochemicaly there's no evidence of hyper or hyposecretion    3) Hypogonadotrophic hypogonadism:    - Testosterone level is below level, he denies changes in dosing , will continue same dose for now, if testosterone continues to be low, will switch to IM injections  - PSA and Hct normal   Medications Continue Androgel  4 pumps daily    4) Dyslipidemia :  - LDL at goal, but triglyceride high but trending down  -Continue current dose of atorvastatin   Medication Continue atorvastatin 20 mg QHS   F/U in 6 months    Signed electronically by: Mack Guise, MD  [facility]  [address] [place], [place] [address] Phone: [phone] FAX: [phone]   CC: Kristen Loader, [place] [address] Phone: [phone]  Fax: [phone]  Return to Endocrinology clinic as below: No future appointments.

## 2021-05-02 ENCOUNTER — Telehealth: Payer: Self-pay | Admitting: Internal Medicine

## 2021-05-02 ENCOUNTER — Other Ambulatory Visit (HOSPITAL_COMMUNITY): Payer: Self-pay

## 2021-05-02 NOTE — Telephone Encounter (Signed)
Pt calling to inform that his pharmacy no longer carries Testosterone 20.25 MG/ACT (1.62%) GEL   Pt needs an alternative.  [id number] - [place], [place] - [address]  [address], [place] [address]  Phone:  [phone]    Pt contact [phone]

## 2021-05-03 ENCOUNTER — Telehealth: Payer: Self-pay

## 2021-05-03 ENCOUNTER — Other Ambulatory Visit (HOSPITAL_COMMUNITY): Payer: Self-pay

## 2021-05-03 NOTE — Telephone Encounter (Signed)
Active PA on file covers the Testosterone pump.  Called ins. & they have put in an override for [facility].

## 2021-05-03 NOTE — Telephone Encounter (Signed)
error 

## 2021-05-04 ENCOUNTER — Other Ambulatory Visit: Payer: Self-pay | Admitting: Internal Medicine

## 2021-05-04 ENCOUNTER — Other Ambulatory Visit (HOSPITAL_COMMUNITY): Payer: Self-pay

## 2021-05-04 MED ORDER — TESTOSTERONE 25 MG/2.5GM (1%) TD GEL: 81.0000 mg | Freq: Every day | TRANSDERMAL | 5 refills | Status: DC

## 2021-05-17 ENCOUNTER — Telehealth: Payer: Self-pay

## 2021-05-17 NOTE — Telephone Encounter (Signed)
Per patient insurance will no longer cover the Testosterone 25 MG/2.5 gm 1% Gel. He is seeking another option for medication.   Patient last dose on hand will applied on tomorrow 05/18/2021. He wants to know if a replacement can be sent to the pharmacy?   Call back phone [phone].

## 2021-05-18 ENCOUNTER — Other Ambulatory Visit (HOSPITAL_COMMUNITY): Payer: Self-pay

## 2021-05-18 ENCOUNTER — Encounter: Payer: Self-pay | Admitting: Internal Medicine

## 2021-05-18 MED ORDER — TESTOSTERONE 20.25 MG/1.25GM (1.62%) TD GEL: 81.0000 mg | Freq: Every day | TRANSDERMAL | 4 refills | Status: DC

## 2021-05-18 NOTE — Telephone Encounter (Signed)
No PA is needed for the gel packets.  Also, the copay for the patient will be $8.14.  This is not a mail order so the pt. will have to pick it up.

## 2021-05-18 NOTE — Telephone Encounter (Signed)
Mychart messaged received and forwarded to provider.

## 2021-06-09 ENCOUNTER — Telehealth: Payer: BC Managed Care – PPO | Admitting: Physician Assistant

## 2021-06-09 DIAGNOSIS — J019 Acute sinusitis, unspecified: Secondary | ICD-10-CM | POA: Diagnosis not present

## 2021-06-09 DIAGNOSIS — B9689 Other specified bacterial agents as the cause of diseases classified elsewhere: Secondary | ICD-10-CM

## 2021-06-09 MED ORDER — AMOXICILLIN-POT CLAVULANATE 875-125 MG PO TABS: 1.0000 | ORAL_TABLET | Freq: Two times a day (BID) | ORAL | 0 refills | Status: DC

## 2021-06-09 MED ORDER — PROMETHAZINE-DM 6.25-15 MG/5ML PO SYRP: 5.0000 mL | ORAL_SOLUTION | Freq: Four times a day (QID) | ORAL | 0 refills | Status: DC | PRN

## 2021-06-09 NOTE — Progress Notes (Signed)

## 2021-06-09 NOTE — Addendum Note (Signed)
Addended by: Mar Daring on: 06/09/2021 09:33 AM   Modules accepted: Orders

## 2021-10-23 ENCOUNTER — Other Ambulatory Visit (INDEPENDENT_AMBULATORY_CARE_PROVIDER_SITE_OTHER): Payer: BC Managed Care – PPO

## 2021-10-23 DIAGNOSIS — E291 Testicular hypofunction: Secondary | ICD-10-CM | POA: Diagnosis not present

## 2021-10-23 DIAGNOSIS — E785 Hyperlipidemia, unspecified: Secondary | ICD-10-CM | POA: Diagnosis not present

## 2021-10-23 DIAGNOSIS — E230 Hypopituitarism: Secondary | ICD-10-CM

## 2021-10-23 DIAGNOSIS — E1142 Type 2 diabetes mellitus with diabetic polyneuropathy: Secondary | ICD-10-CM

## 2021-10-23 LAB — COMPREHENSIVE METABOLIC PANEL
ALT: 34 U/L (ref 0–53)
AST: 22 U/L (ref 0–37)
Albumin: 4.4 g/dL (ref 3.5–5.2)
Alkaline Phosphatase: 57 U/L (ref 39–117)
BUN: 10 mg/dL (ref 6–23)
CO2: 25 mEq/L (ref 19–32)
Calcium: 9.7 mg/dL (ref 8.4–10.5)
Chloride: 98 mEq/L (ref 96–112)
Creatinine, Ser: 0.9 mg/dL (ref 0.40–1.50)
GFR: 101.93 mL/min (ref >60.00)
Glucose, Bld: 107 mg/dL — ABNORMAL HIGH (ref 70–99)
Potassium: 4.2 mEq/L (ref 3.5–5.1)
Sodium: 137 mEq/L (ref 135–145)
Total Bilirubin: 0.8 mg/dL (ref 0.2–1.2)
Total Protein: 7.4 g/dL (ref 6.0–8.3)

## 2021-10-23 LAB — MICROALBUMIN / CREATININE URINE RATIO
Creatinine,U: 213.1 mg/dL
Microalb Creat Ratio: 0.6 mg/g (ref 0.0–30.0)
Microalb, Ur: 1.3 mg/dL (ref 0.0–1.9)

## 2021-10-23 LAB — LIPID PANEL
Cholesterol: 161 mg/dL (ref 0–200)
HDL: 34.9 mg/dL — ABNORMAL LOW (ref >39.00)
LDL Cholesterol: 92 mg/dL (ref 0–99)
NonHDL: 126.06
Total CHOL/HDL Ratio: 5
Triglycerides: 170 mg/dL — ABNORMAL HIGH (ref 0.0–149.0)
VLDL: 34 mg/dL (ref 0.0–40.0)

## 2021-10-23 LAB — CBC
HCT: 45.7 % (ref 39.0–52.0)
Hemoglobin: 15.2 g/dL (ref 13.0–17.0)
MCHC: 33.2 g/dL (ref 30.0–36.0)
MCV: 88.6 fl (ref 78.0–100.0)
Platelets: 250 10*3/uL (ref 150.0–400.0)
RBC: 5.16 Mil/uL (ref 4.22–5.81)
RDW: 14.5 % (ref 11.5–15.5)
WBC: 11.1 10*3/uL — ABNORMAL HIGH (ref 4.0–10.5)

## 2021-10-23 LAB — HEMOGLOBIN A1C: Hgb A1c MFr Bld: 6.8 % — ABNORMAL HIGH (ref 4.6–6.5)

## 2021-10-23 LAB — PSA: PSA: 0.67 ng/mL (ref 0.10–4.00)

## 2021-10-23 LAB — TESTOSTERONE: Testosterone: 263.73 ng/dL — ABNORMAL LOW (ref 300.00–890.00)

## 2021-10-27 ENCOUNTER — Encounter: Payer: Self-pay | Admitting: Internal Medicine

## 2021-10-30 ENCOUNTER — Ambulatory Visit: Payer: BC Managed Care – PPO | Admitting: Internal Medicine

## 2021-10-30 ENCOUNTER — Encounter: Payer: Self-pay | Admitting: Internal Medicine

## 2021-10-30 VITALS — BP 120/70 | HR 68 | Ht 70.0 in | Wt 283.0 lb

## 2021-10-30 DIAGNOSIS — E1142 Type 2 diabetes mellitus with diabetic polyneuropathy: Secondary | ICD-10-CM | POA: Diagnosis not present

## 2021-10-30 DIAGNOSIS — D352 Benign neoplasm of pituitary gland: Secondary | ICD-10-CM | POA: Diagnosis not present

## 2021-10-30 DIAGNOSIS — E291 Testicular hypofunction: Secondary | ICD-10-CM | POA: Diagnosis not present

## 2021-10-30 DIAGNOSIS — E785 Hyperlipidemia, unspecified: Secondary | ICD-10-CM | POA: Diagnosis not present

## 2021-10-30 DIAGNOSIS — R739 Hyperglycemia, unspecified: Secondary | ICD-10-CM

## 2021-10-30 LAB — POCT GLUCOSE (DEVICE FOR HOME USE): POC Glucose: 102 mg/dl — AB (ref 70–99)

## 2021-10-30 MED ORDER — TESTOSTERONE CYPIONATE 100 MG/ML IJ SOLN: 100.0000 mg | INTRAMUSCULAR | 0 refills | Status: DC

## 2021-10-30 MED ORDER — "EASYPOINT NEEDLE/SYRINGE 25G X 5/8"" 3 ML MISC": 1.0000 | 3 refills | Status: DC

## 2021-10-30 MED ORDER — "NEEDLE (DISP) 22G X 1"" MISC": 1.0000 | 3 refills | Status: AC

## 2021-10-30 NOTE — Progress Notes (Signed)
? ?Name: Cody Sullivan  ?Age/ Sex: 46 y.o., male   ?MRN/ DOB: [id number], 05/06/1976    ? ?PCP: Kristen Loader, FNP   ?Reason for Endocrinology Evaluation: Type 2 Diabetes Mellitus  ?Initial Endocrine Consultative Visit: 04/10/18  ? ? ?PATIENT IDENTIFIER: [name] is a 46 y.o. male with a past medical history of OSA on CPAP, dyslipidemia and obesity . The patient has followed with Endocrinology clinic since 04/10/18 for consultative assistance with management of his diabetes. ? ?DIABETIC HISTORY:  ?Cody Sullivan was diagnosed with T2DM in 2019, her was offered Metformin at the time of his diagnosis by his PCP., but the patient opted for diet control and lifestyle changes. His hemoglobin A1c has ranged from 6.3%  in 10/2017, peaking at 6.5% in 03/2018 . ? ? ?Low Testosterone:  ? ?He was found to have a low testosterone in 03/2018 ( T. Testosterone 126 ng/dL) with inappropriately normal LH/FSH. 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  ? ?He is S/P Right frontal craniotomy on 08/04/2018 through Dr. Alfred Levins  ? ?He has been tired since 2017, he has noted somewhat decrease in libido, no difficulty with erections. He has noted gynecomastia for a while, no pain or discharge from the nipples. He also noted a subjective decrease in testicular size ~ 01/2018. He has 2 biological kids. He has not noticed any decrease in frequency of shaving.  ?He has chronic occasional headaches. ?  ?  ?No known FH  of prostate cancer.  ? ?Testosterone started 01/2019 ? ?SUBJECTIVE:  ? ?Today (10/30/2021): Cody Sullivan is here for afollow up on his diabetes management and hypogonadism. He checks his blood sugars 0  time a week. The patient has not had hypoglycemic episodes since the last clinic visit. ? ?He was started on [employer] last week  ?He has made dietary changes  ?Denies nausea, vomiting or diarrhea  ? ?He continues to follow up  with  Dr. Owens Shark (neurosurgery) for back issues ? ? ? ?He is S/P right frontal craniotomy on 08/04/2018 for arachnoid cysts.Headaches stable MRI stable ? ? ?In term of his low testosterone he has no side effects to the androgel ? ? ? ? ? ?HOME ENDOCRINE REGIMEN:  ?Androgel 4 pumps daily  ?Atorvastatin 20 mg daily  ? ? ? ? ? ?METER DOWNLOAD SUMMARY: did not bring  ? ? ? ? ?DIABETIC COMPLICATIONS: ?Microvascular complications:  ? ?Denies: Neuropathy, CKD or retinopathy  ?Last Eye Exam: Completed 2022 ? ? ?Macrovascular complications:  ? ?Denies: CAD, CVA, PVD ? ? ?HISTORY:  ?Past Medical History:  ?Past Medical History:  ?Diagnosis Date  ? Allergy   ? Diabetes mellitus without complication ([place])   ? Hypertension   ? Kidney stones   ? Sleep apnea   ? cpap  ? ?Past Surgical History:  ?Past Surgical History:  ?Procedure Laterality Date  ? ANKLE SURGERY  2003  ? right   ? cracked chest  2007  ? equipment fell on chest and cracked his chest, had pain related to injury, cardiologist confirmed not related to heart.    ? CRANIOTOMY Right 08/04/2018  ? Right frontal - cyst removal  ? INCISION AND DRAINAGE PERIRECTAL ABSCESS N/A 07/18/2020  ? Procedure: IRRIGATION AND DEBRIDEMENT PERIRECTAL ABSCESS;  Surgeon: Felicie Morn, MD;  Location: [facility];  Service: General;  Laterality: N/A;  ? LITHOTRIPSY  2010  ?  nose fracture  2000  ? ?Social History:  reports that he has never smoked. He has never used smokeless tobacco. He reports that he does not drink alcohol and does not use drugs. ?Family History: No family history of prostate ca.  ? ? ?HOME MEDICATIONS: ?Allergies as of 10/30/2021   ?No Known Allergies ?  ? ?  ?Medication List  ?  ? ?  ? Accurate as of Oct 30, 2021  4:20 PM. If you have any questions, ask your nurse or doctor.  ?  ?  ? ?  ? ?STOP taking these medications   ? ?amoxicillin-clavulanate 875-125 MG tablet ?Commonly known as: AUGMENTIN ?Stopped by: Dorita Sciara, MD ?  ?promethazine-dextromethorphan  6.25-15 MG/5ML syrup ?Commonly known as: PROMETHAZINE-DM ?Stopped by: Dorita Sciara, MD ?  ?Testosterone 20.25 MG/1.25GM (1.62%) Gel ?Stopped by: Dorita Sciara, MD ?  ? ?  ? ?TAKE these medications   ? ?acetaminophen 500 MG tablet ?Commonly known as: TYLENOL ?Take 1,000 mg by mouth every 6 (six) hours as needed for mild pain. ?  ?atorvastatin 20 MG tablet ?Commonly known as: LIPITOR ?Take 1 tablet (20 mg total) by mouth daily. ?  ?b complex vitamins capsule ?Take 1 capsule by mouth daily. ?  ?Contour Next EZ w/Device Kit ?1 Device by Does not apply route 2 (two) times daily. ?  ?Contour Next Test test strip ?Generic drug: glucose blood ?Use as instructed to check blood sugar once daily ?  ?EasyPoint Needle/Syringe 25G X 5/8" 3 ML Misc ?Generic drug: SYRINGE-NEEDLE (DISP) 3 ML ?1 Device by Does not apply route every 14 (fourteen) days. ?Started by: Dorita Sciara, MD ?  ?lisinopril-hydrochlorothiazide 20-25 MG tablet ?Commonly known as: ZESTORETIC ?TAKE 1 TABLET BY MOUTH EVERY DAY ?  ?meloxicam 15 MG tablet ?Commonly known as: MOBIC ?Take 15 mg by mouth daily. ?  ?methocarbamol 500 MG tablet ?Commonly known as: ROBAXIN ?Take 500 mg by mouth every 6 (six) hours as needed for muscle spasms. ?  ?Microlet Lancets Misc ?Use to check blood sugars once a day with Contour Next glucometer ?  ?NEEDLE (DISP) 22 G 22G X 1" Misc ?1 Device by Does not apply route every 14 (fourteen) days. ?Started by: Dorita Sciara, MD ?  ?NON FORMULARY ?Golo dietary supplements ?  ?Testosterone Cypionate 100 MG/ML Soln ?Inject 100 mg as directed every 14 (fourteen) days. ?Started by: Dorita Sciara, MD ?  ? ?  ?  ? ? ?OBJECTIVE:  ? ?Vital Signs: BP 120/70 (BP Location: Left Arm, Patient Position: Sitting, Cuff Size: Large)   Pulse 68   Ht _0  (1.778 m)   Wt 283 lb (128.4 kg)   SpO2 97%   BMI 40.61 kg/m?  ? ?Wt Readings from Last 3 Encounters:  ?10/30/21 283 lb (128.4 kg)  ?05/01/21 279 lb (126.6 kg)   ?10/28/20 271 lb 8 oz (123.2 kg)  ? ? ? ?Exam: ?General: Pt appears well and is in NAD  ?Lungs: Clear with good BS bilat with no rales, rhonchi, or wheezes  ?Heart: RRR   ?Extremities:  Trace edema around ankles  ?Neuro: MS is good with appropriate affect, pt is alert and Ox3  ? ? ?DM foot exam: 05/01/2021 ?The skin of the feet is intact without sores or ulcerations.  ?The pedal pulses are 2+ on right and 2+ on left. ?The sensation is absent to a screening 5.07, 10 gram monofilament bilaterally at the toes.  ?  ?  ?   ? ? ? ?  DATA REVIEWED: ?  ?Lab Results  ?Component Value Date  ? HGBA1C 6.8 (H) 10/23/2021  ? HGBA1C 6.5 04/24/2021  ? HGBA1C 6.0 10/20/2020  ? ? ? Latest Reference Range & Units 10/23/21 09:07  ?Sodium 135 - 145 mEq/L 137  ?Potassium 3.5 - 5.1 mEq/L 4.2  ?Chloride 96 - 112 mEq/L 98  ?CO2 19 - 32 mEq/L 25  ?Glucose 70 - 99 mg/dL 107 (H)  ?BUN 6 - 23 mg/dL 10  ?Creatinine 0.40 - 1.50 mg/dL 0.90  ?Calcium 8.4 - 10.5 mg/dL 9.7  ?Alkaline Phosphatase 39 - 117 U/L 57  ?Albumin 3.5 - 5.2 g/dL 4.4  ?AST 0 - 37 U/L 22  ?ALT 0 - 53 U/L 34  ?Total Protein 6.0 - 8.3 g/dL 7.4  ?Total Bilirubin 0.2 - 1.2 mg/dL 0.8  ?GFR >60.00 mL/min 101.93  ?Total CHOL/HDL Ratio  5  ?Cholesterol 0 - 200 mg/dL 161  ?HDL Cholesterol >39.00 mg/dL 34.90 (L)  ?LDL (calc) 0 - 99 mg/dL 92  ?MICROALB/CREAT RATIO 0.0 - 30.0 mg/g 0.6  ?NonHDL  126.06  ?Triglycerides 0.0 - 149.0 mg/dL 170.0 (H)  ?VLDL 0.0 - 40.0 mg/dL 34.0  ? ? Latest Reference Range & Units 10/23/21 09:07  ?Glucose 70 - 99 mg/dL 107 (H)  ?Hemoglobin A1C 4.6 - 6.5 % 6.8 (H)  ?Testosterone 300.00 - 890.00 ng/dL 263.73 (L)  ?PSA 0.10 - 4.00 ng/mL 0.67  ? ? ? ? ? ?MRI 08/23/2020 ? ?08/23/2020 8:20 AM EST   ?IMPRESSION:  ? ?1. No significant change since 08/25/2019. ? ?2. 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 ? ?3. Normal MRI of the sella/pituitary gland without and with contrast. ?ASSESSMENT / PLAN / RECOMMENDATIONS:  ? ?1)  Type 2 Diabetes Mellitus, in  remission with neuropathy - Most recent A1c of 6.8 %. Goal A1c < 7.0 %.  ?  ?-A1c continues at goal but trending up  ?- Emphasized low-carb diet.  Unfortunately he is unable to exercise due to back issues ?-He recently started

## 2021-10-30 NOTE — Patient Instructions (Signed)
Testosterone 100 mg every 14 days  ?

## 2021-11-01 ENCOUNTER — Other Ambulatory Visit (HOSPITAL_BASED_OUTPATIENT_CLINIC_OR_DEPARTMENT_OTHER): Payer: Self-pay

## 2021-11-01 ENCOUNTER — Encounter (HOSPITAL_BASED_OUTPATIENT_CLINIC_OR_DEPARTMENT_OTHER): Payer: Self-pay

## 2021-11-01 MED ORDER — TESTOSTERONE CYPIONATE 200 MG/ML IM SOLN
100.0000 mg | INTRAMUSCULAR | 1 refills | Status: DC
  Filled 2021-11-01 – 2021-11-03 (×2): qty 2, 28d supply, fill #0
  Filled 2021-12-15: qty 2, 28d supply, fill #1

## 2021-11-01 MED ORDER — "SYRINGE/NEEDLE (DISP) 25G X 5/8"" 3 ML MISC"
1.0000 | 3 refills | Status: DC
  Filled 2021-11-01: qty 10, 300d supply, fill #0
  Filled 2021-11-14: qty 10, 10d supply, fill #0
  Filled 2022-01-08: qty 10, 140d supply, fill #0
  Filled 2022-08-07: qty 6, 84d supply, fill #0

## 2021-11-01 NOTE — Addendum Note (Signed)
Addended by: Dorita Sciara on: 11/01/2021 03:05 PM ? ? Modules accepted: Orders ? ?

## 2021-11-02 ENCOUNTER — Other Ambulatory Visit (HOSPITAL_BASED_OUTPATIENT_CLINIC_OR_DEPARTMENT_OTHER): Payer: Self-pay

## 2021-11-03 ENCOUNTER — Telehealth: Payer: Self-pay | Admitting: Pharmacy Technician

## 2021-11-03 ENCOUNTER — Other Ambulatory Visit (HOSPITAL_BASED_OUTPATIENT_CLINIC_OR_DEPARTMENT_OTHER): Payer: Self-pay

## 2021-11-03 ENCOUNTER — Other Ambulatory Visit (HOSPITAL_COMMUNITY): Payer: Self-pay

## 2021-11-03 NOTE — Telephone Encounter (Signed)
PA has been submitted.

## 2021-11-03 NOTE — Telephone Encounter (Signed)
Patient Advocate Encounter  Received notification from [employer] that prior authorization for TESTOSTERONE CYPIONATE '200MG'$  is required.   PA submitted on 5.19.23 [id number] Status is pending   [facility] will continue to follow  Luciano Cutter, CPhT Patient Advocate Phone: [phone]

## 2021-11-06 ENCOUNTER — Other Ambulatory Visit (HOSPITAL_BASED_OUTPATIENT_CLINIC_OR_DEPARTMENT_OTHER): Payer: Self-pay

## 2021-11-14 ENCOUNTER — Other Ambulatory Visit (HOSPITAL_COMMUNITY): Payer: Self-pay

## 2021-11-14 ENCOUNTER — Other Ambulatory Visit (HOSPITAL_BASED_OUTPATIENT_CLINIC_OR_DEPARTMENT_OTHER): Payer: Self-pay

## 2021-11-14 NOTE — Telephone Encounter (Signed)
PA questions populated. I was able to complete the PA request and submit it.

## 2021-11-14 NOTE — Telephone Encounter (Signed)
Patient Advocate Encounter  Prior Authorization for TESTOSTERONE CYPIONATE '200MG'$  has been approved.    PA# PA Case ID: [id number] Effective dates: 11/14/21 through 11/14/24  Per Test Claim Patients co-pay is $10.   Spoke with Pharmacy to Process.  Patient Advocate Fax:  [phone]

## 2021-11-14 NOTE — Telephone Encounter (Signed)
Patient Advocate Encounter  Questions didn't populate timely in CMM previously, therefore expired. Started a new PA. Questions aren't populating now either.   PA startedon 11/14/21 [id number] Will complete once questions appear.

## 2021-11-15 ENCOUNTER — Other Ambulatory Visit (HOSPITAL_BASED_OUTPATIENT_CLINIC_OR_DEPARTMENT_OTHER): Payer: Self-pay

## 2021-12-05 ENCOUNTER — Other Ambulatory Visit (HOSPITAL_BASED_OUTPATIENT_CLINIC_OR_DEPARTMENT_OTHER): Payer: Self-pay

## 2021-12-15 ENCOUNTER — Other Ambulatory Visit (HOSPITAL_BASED_OUTPATIENT_CLINIC_OR_DEPARTMENT_OTHER): Payer: Self-pay

## 2021-12-26 ENCOUNTER — Other Ambulatory Visit: Payer: Self-pay

## 2021-12-26 ENCOUNTER — Other Ambulatory Visit (HOSPITAL_BASED_OUTPATIENT_CLINIC_OR_DEPARTMENT_OTHER): Payer: Self-pay

## 2021-12-26 ENCOUNTER — Encounter (HOSPITAL_BASED_OUTPATIENT_CLINIC_OR_DEPARTMENT_OTHER): Payer: Self-pay | Admitting: Pharmacist

## 2021-12-26 ENCOUNTER — Other Ambulatory Visit: Payer: Self-pay | Admitting: Internal Medicine

## 2022-01-08 ENCOUNTER — Other Ambulatory Visit (HOSPITAL_BASED_OUTPATIENT_CLINIC_OR_DEPARTMENT_OTHER): Payer: Self-pay

## 2022-01-08 ENCOUNTER — Other Ambulatory Visit: Payer: Self-pay | Admitting: Internal Medicine

## 2022-01-08 MED ORDER — TESTOSTERONE CYPIONATE 200 MG/ML IM SOLN
100.0000 mg | INTRAMUSCULAR | 1 refills | Status: DC
  Filled 2022-01-08: qty 2, 56d supply, fill #0
  Filled 2022-01-09: qty 2, 28d supply, fill #0
  Filled 2022-02-06: qty 2, 28d supply, fill #1

## 2022-01-09 ENCOUNTER — Other Ambulatory Visit (HOSPITAL_BASED_OUTPATIENT_CLINIC_OR_DEPARTMENT_OTHER): Payer: Self-pay

## 2022-01-31 ENCOUNTER — Other Ambulatory Visit (INDEPENDENT_AMBULATORY_CARE_PROVIDER_SITE_OTHER): Payer: BC Managed Care – PPO

## 2022-01-31 ENCOUNTER — Other Ambulatory Visit (HOSPITAL_BASED_OUTPATIENT_CLINIC_OR_DEPARTMENT_OTHER): Payer: Self-pay

## 2022-01-31 ENCOUNTER — Telehealth: Payer: Self-pay | Admitting: Internal Medicine

## 2022-01-31 DIAGNOSIS — E291 Testicular hypofunction: Secondary | ICD-10-CM | POA: Diagnosis not present

## 2022-01-31 NOTE — Telephone Encounter (Signed)
MEDICATION: Testosterone Cypionate  PHARMACY:  [facility] CONTACTED [place]?  yes  IS THIS A 90 DAY SUPPLY : unknown  IS PATIENT OUT OF MEDICATION: yes  IF NOT; HOW MUCH IS LEFT:   LAST APPOINTMENT DATE: '@7'$ /24/2023  NEXT APPOINTMENT DATE:'@9'$ /12/2021  DO WE HAVE YOUR PERMISSION TO LEAVE A DETAILED MESSAGE?: yes  OTHER COMMENTS:    **Let patient know to contact pharmacy at the end of the day to make sure medication is ready. **  ** Please notify patient to allow 48-72 hours to process**  **Encourage patient to contact the pharmacy for refills or they can request refills through [facility]**

## 2022-01-31 NOTE — Telephone Encounter (Signed)
Pharmacy states that patient has another refill on file and that it will be due to refill on 02/08/22. Patient states that he is 0.'5mg'$  every 14 days and that he was just making sure medication would be there. He didn't think he had another refill at the pharmacy.

## 2022-02-04 LAB — TESTOSTERONE, TOTAL, LC/MS/MS: Testosterone, Total, LC-MS-MS: 739 ng/dL (ref 250–1100)

## 2022-02-06 ENCOUNTER — Other Ambulatory Visit (HOSPITAL_BASED_OUTPATIENT_CLINIC_OR_DEPARTMENT_OTHER): Payer: Self-pay

## 2022-02-22 ENCOUNTER — Ambulatory Visit: Payer: BC Managed Care – PPO | Admitting: Internal Medicine

## 2022-02-22 ENCOUNTER — Encounter: Payer: Self-pay | Admitting: Internal Medicine

## 2022-02-22 ENCOUNTER — Other Ambulatory Visit (HOSPITAL_BASED_OUTPATIENT_CLINIC_OR_DEPARTMENT_OTHER): Payer: Self-pay

## 2022-02-22 VITALS — BP 124/80 | HR 60 | Ht 70.0 in | Wt 256.0 lb

## 2022-02-22 DIAGNOSIS — E1142 Type 2 diabetes mellitus with diabetic polyneuropathy: Secondary | ICD-10-CM

## 2022-02-22 DIAGNOSIS — E291 Testicular hypofunction: Secondary | ICD-10-CM | POA: Diagnosis not present

## 2022-02-22 DIAGNOSIS — D352 Benign neoplasm of pituitary gland: Secondary | ICD-10-CM | POA: Diagnosis not present

## 2022-02-22 DIAGNOSIS — E785 Hyperlipidemia, unspecified: Secondary | ICD-10-CM | POA: Diagnosis not present

## 2022-02-22 LAB — POCT GLUCOSE (DEVICE FOR HOME USE): POC Glucose: 84 mg/dl (ref 70–99)

## 2022-02-22 LAB — POCT GLYCOSYLATED HEMOGLOBIN (HGB A1C): Hemoglobin A1C: 5.7 % — AB (ref 4.0–5.6)

## 2022-02-22 MED ORDER — TESTOSTERONE CYPIONATE 200 MG/ML IM SOLN
100.0000 mg | INTRAMUSCULAR | 5 refills | Status: DC
Start: 2022-02-22 — End: 2022-08-08
  Filled 2022-02-22: qty 2, 56d supply, fill #0
  Filled 2022-03-02: qty 2, 28d supply, fill #0
  Filled 2022-03-30: qty 2, 28d supply, fill #1
  Filled 2022-04-23 – 2022-04-25 (×2): qty 2, 28d supply, fill #2
  Filled 2022-05-21: qty 2, 28d supply, fill #3
  Filled 2022-06-12: qty 2, 28d supply, fill #4

## 2022-02-22 NOTE — Progress Notes (Signed)
Name: Cody Sullivan  Age/ Sex: 46 y.o., male   MRN/ DOB: [id number], Jun 14, 1976     PCP: Soundra Pilon, FNP   Reason for Endocrinology Evaluation: Type 2 Diabetes Mellitus  Initial Endocrine Consultative Visit: 04/10/18    PATIENT IDENTIFIER: Cody Sullivan is a 46 y.o. male with a past medical history of OSA on CPAP, dyslipidemia and obesity . The patient has followed with Endocrinology clinic since 04/10/18 for consultative assistance with management of his diabetes.  DIABETIC HISTORY:  Cody Sullivan was diagnosed with T2DM in 2019, her was offered Metformin at the time of his diagnosis by his PCP., but the patient opted for diet control and lifestyle changes. His hemoglobin A1c has ranged from 6.3%  in 10/2017, peaking at 6.5% in 03/2018 .   Low Testosterone:   He was found to have a low testosterone in 03/2018 ( T. Testosterone 126 ng/dL) with inappropriately normal LH/FSH. 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   He is S/P Right frontal craniotomy on 08/04/2018 through Dr. Gwendlyn Deutscher   He has been tired since 2017, he has noted somewhat decrease in libido, no difficulty with erections. He has noted gynecomastia for a while, no pain or discharge from the nipples. He also noted a subjective decrease in testicular size ~ 01/2018. He has 2 biological kids. He has not noticed any decrease in frequency of shaving.  He has chronic occasional headaches.     No known FH  of prostate cancer.   Testosterone started 01/2019  SUBJECTIVE:   Today (02/22/2022): Cody Sullivan is here for afollow up on his diabetes management and hypogonadism. He checks his blood sugars 0  time a week. The patient has not had hypoglycemic episodes since the last clinic visit.  He was started on [employer] , continues with weight loss   He continues to follow up with  Dr. Manson Passey (neurosurgery) for back issues He is  scheduled for MRI followed by a follow up  He is S/P right frontal craniotomy on 08/04/2018 for arachnoid cysts.Headaches stable MRI stable  Denies nausea, vomiting  Denies rash with testosterone injection Denies mood changes    HOME ENDOCRINE REGIMEN:  Testosterone Cypionate 200mg /mL  Atorvastatin 20 mg daily       METER DOWNLOAD SUMMARY: did not bring      DIABETIC COMPLICATIONS: Microvascular complications:   Denies: Neuropathy, CKD or retinopathy  Last Eye Exam: Completed 2022   Macrovascular complications:   Denies: CAD, CVA, PVD   HISTORY:  Past Medical History:  Past Medical History:  Diagnosis Date   Allergy    Diabetes mellitus without complication ([facility])    Hypertension    Kidney stones    Sleep apnea    cpap   Past Surgical History:  Past Surgical History:  Procedure Laterality Date   ANKLE SURGERY  2003   right    cracked chest  2007   equipment fell on chest and cracked his chest, had pain related to injury, cardiologist confirmed not related to heart.     CRANIOTOMY Right 08/04/2018   Right frontal - cyst removal   INCISION AND DRAINAGE PERIRECTAL ABSCESS N/A 07/18/2020   Procedure: IRRIGATION AND DEBRIDEMENT PERIRECTAL ABSCESS;  Surgeon: 07/20/2020, MD;  Location: [facility];  Service: General;  Laterality: N/A;   LITHOTRIPSY  2010   nose fracture  2000  Social History:  reports that he has never smoked. He has never used smokeless tobacco. He reports that he does not drink alcohol and does not use drugs. Family History: No family history of prostate ca.    HOME MEDICATIONS: Allergies as of 02/22/2022   No Known Allergies      Medication List        Accurate as of February 22, 2022 12:55 PM. If you have any questions, ask your nurse or doctor.          acetaminophen 500 MG tablet Commonly known as: TYLENOL Take 1,000 mg by mouth every 6 (six) hours as needed for mild pain.   atorvastatin 20 MG tablet Commonly known  as: LIPITOR Take 1 tablet (20 mg total) by mouth daily.   b complex vitamins capsule Take 1 capsule by mouth daily.   Contour Next EZ w/Device Kit 1 Device by Does not apply route 2 (two) times daily.   Contour Next Test test strip Generic drug: glucose blood Use as instructed to check blood sugar once daily   lisinopril-hydrochlorothiazide 20-25 MG tablet Commonly known as: ZESTORETIC TAKE 1 TABLET BY MOUTH EVERY DAY   meloxicam 15 MG tablet Commonly known as: MOBIC Take 15 mg by mouth daily.   methocarbamol 500 MG tablet Commonly known as: ROBAXIN Take 500 mg by mouth every 6 (six) hours as needed for muscle spasms.   Microlet Lancets Misc Use to check blood sugars once a day with Contour Next glucometer   NEEDLE (DISP) 22 G 22G X 1" Misc 1 Device by Does not apply route every 14 (fourteen) days.   NON FORMULARY [employer] dietary supplements   SYRINGE-NEEDLE (DISP) 3 ML 25G X 5/8" 3 ML Misc Use as directed every 14 days. (1 Device by Does not apply route every 14 (fourteen) days.)   testosterone cypionate 200 MG/ML injection Commonly known as: DEPOTESTOSTERONE CYPIONATE Inject 0.5 mLs (100 mg total) into the muscle every 14 (fourteen) days. What changed: how to take this Changed by: Dorita Sciara, MD          OBJECTIVE:   Vital Signs: BP 124/80 (BP Location: Left Arm, Patient Position: Sitting, Cuff Size: Large)   Pulse 60   Ht $R'5\' 10"'nG$  (1.778 m)   Wt 256 lb (116.1 kg)   SpO2 94%   BMI 36.73 kg/m   Wt Readings from Last 3 Encounters:  02/22/22 256 lb (116.1 kg)  10/30/21 283 lb (128.4 kg)  05/01/21 279 lb (126.6 kg)     Exam: General: Pt appears well and is in NAD  Lungs: Clear with good BS bilat with no rales, rhonchi, or wheezes  Heart: RRR   Extremities:  Trace edema around ankles  Neuro: MS is good with appropriate affect, pt is alert and Ox3    DM foot exam: 05/01/2021 The skin of the feet is intact without sores or ulcerations.  The  pedal pulses are 2+ on right and 2+ on left. The sensation is absent to a screening 5.07, 10 gram monofilament bilaterally at the toes.            DATA REVIEWED:   Lab Results  Component Value Date   HGBA1C 5.7 (A) 02/22/2022   HGBA1C 6.8 (H) 10/23/2021   HGBA1C 6.5 04/24/2021    Latest Reference Range & Units Most Recent  Testosterone, Total, LC-MS-MS 250 - 1,100 ng/dL 739 01/31/22 08:10     Latest Reference Range & Units 10/23/21 09:07  Sodium 135 -  145 mEq/L 137  Potassium 3.5 - 5.1 mEq/L 4.2  Chloride 96 - 112 mEq/L 98  CO2 19 - 32 mEq/L 25  Glucose 70 - 99 mg/dL 107 (H)  BUN 6 - 23 mg/dL 10  Creatinine 0.40 - 1.50 mg/dL 0.90  Calcium 8.4 - 10.5 mg/dL 9.7  Alkaline Phosphatase 39 - 117 U/L 57  Albumin 3.5 - 5.2 g/dL 4.4  AST 0 - 37 U/L 22  ALT 0 - 53 U/L 34  Total Protein 6.0 - 8.3 g/dL 7.4  Total Bilirubin 0.2 - 1.2 mg/dL 0.8  GFR >60.00 mL/min 101.93  Total CHOL/HDL Ratio  5  Cholesterol 0 - 200 mg/dL 161  HDL Cholesterol >39.00 mg/dL 34.90 (L)  LDL (calc) 0 - 99 mg/dL 92  MICROALB/CREAT RATIO 0.0 - 30.0 mg/g 0.6  NonHDL  126.06  Triglycerides 0.0 - 149.0 mg/dL 170.0 (H)  VLDL 0.0 - 40.0 mg/dL 34.0    Latest Reference Range & Units 10/23/21 09:07  Glucose 70 - 99 mg/dL 107 (H)  Hemoglobin A1C 4.6 - 6.5 % 6.8 (H)  Testosterone 300.00 - 890.00 ng/dL 263.73 (L)  PSA 0.10 - 4.00 ng/mL 0.67       MRI 08/23/2020  08/23/2020 8:20 AM EST   IMPRESSION:   1. No significant change since 08/25/2019.  2. 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  3. Normal MRI of the sella/pituitary gland without and with contrast. ASSESSMENT / PLAN / RECOMMENDATIONS:   1)  Type 2 Diabetes Mellitus, in remission with neuropathy - Most recent A1c of 5.7 %. Goal A1c < 7.0 %.    -A1c continues at goal  -Praised patient on weight loss , he continues to be on [employer] -Continue lifestyle changes -No medications needed  EDUCATION / INSTRUCTIONS: BG monitoring  instructions: Patient is instructed to check his blood sugars 2 times a week, fasting .     2) Pituitary Microadenoma:   - MRI 08/2020 shows stable right middle arachnoid cyst, no evidence of pituitary microadenoma on this MRI  - Clinically and biochemicaly there's no evidence of hyper or hyposecretion except for low testosterone -Continues to follow-up with neurosurgery    3) Hypogonadotrophic hypogonadism:    - Testosterone level is below level, he has been having difficulty obtaining testosterone to the pharmacy -We have opted to switch to IM testosterone in May 2023 - PSA and Hct normal -Repeat testosterone levels are normal   Medications  Continue testosterone cypionate 200 mg/mL, take 100 mg  ( 0.5 mL) q. 14 days   4) Dyslipidemia :  -Lipid panel acceptable -Continue current dose of atorvastatin   Medication Continue atorvastatin 20 mg QHS   F/U in 6 months    Signed electronically by: Mack Guise, MD  [facility] Endocrinology  [facility] [place], [place] [address] Phone: [phone] FAX: [phone]   CC: Kristen Loader, [place] [address] Phone: [phone]  Fax: [phone]  Return to Endocrinology clinic as below: Future Appointments  Date Time Provider [place]  05/03/2022  9:50 AM Hailei Besser, Melanie Crazier, MD [facility] None  08/24/2022  8:00 AM [facility] None  08/28/2022 10:30 AM Prateek Knipple, Melanie Crazier, MD [facility] None

## 2022-03-01 ENCOUNTER — Other Ambulatory Visit (HOSPITAL_BASED_OUTPATIENT_CLINIC_OR_DEPARTMENT_OTHER): Payer: Self-pay

## 2022-03-02 ENCOUNTER — Other Ambulatory Visit (HOSPITAL_BASED_OUTPATIENT_CLINIC_OR_DEPARTMENT_OTHER): Payer: Self-pay

## 2022-03-30 ENCOUNTER — Other Ambulatory Visit (HOSPITAL_BASED_OUTPATIENT_CLINIC_OR_DEPARTMENT_OTHER): Payer: Self-pay

## 2022-03-31 ENCOUNTER — Other Ambulatory Visit: Payer: Self-pay | Admitting: Internal Medicine

## 2022-04-09 ENCOUNTER — Emergency Department (HOSPITAL_BASED_OUTPATIENT_CLINIC_OR_DEPARTMENT_OTHER)
Admission: EM | Admit: 2022-04-09 | Discharge: 2022-04-09 | Disposition: A | Discharge status: Home / Self Care | Payer: BC Managed Care – PPO | Attending: Emergency Medicine | Admitting: Emergency Medicine

## 2022-04-09 ENCOUNTER — Emergency Department (HOSPITAL_BASED_OUTPATIENT_CLINIC_OR_DEPARTMENT_OTHER): Payer: BC Managed Care – PPO

## 2022-04-09 ENCOUNTER — Other Ambulatory Visit: Payer: Self-pay

## 2022-04-09 ENCOUNTER — Encounter (HOSPITAL_BASED_OUTPATIENT_CLINIC_OR_DEPARTMENT_OTHER): Payer: Self-pay | Admitting: Emergency Medicine

## 2022-04-09 DIAGNOSIS — R519 Headache, unspecified: Secondary | ICD-10-CM | POA: Diagnosis present

## 2022-04-09 DIAGNOSIS — B029 Zoster without complications: Secondary | ICD-10-CM | POA: Diagnosis not present

## 2022-04-09 LAB — BASIC METABOLIC PANEL
Anion gap: 7 (ref 5–15)
BUN: 13 mg/dL (ref 6–20)
CO2: 24 mmol/L (ref 22–32)
Calcium: 9.2 mg/dL (ref 8.9–10.3)
Chloride: 106 mmol/L (ref 98–111)
Creatinine, Ser: 0.77 mg/dL (ref 0.61–1.24)
GFR, Estimated: >60 mL/min (ref >60)
Glucose, Bld: 132 mg/dL — ABNORMAL HIGH (ref 70–99)
Potassium: 3.9 mmol/L (ref 3.5–5.1)
Sodium: 137 mmol/L (ref 135–145)

## 2022-04-09 LAB — CBC WITH DIFFERENTIAL/PLATELET
Abs Immature Granulocytes: 0.02 10*3/uL (ref 0.00–0.07)
Basophils Absolute: 0.1 10*3/uL (ref 0.0–0.1)
Basophils Relative: 1 %
Eosinophils Absolute: 0.2 10*3/uL (ref 0.0–0.5)
Eosinophils Relative: 1 %
HCT: 48.4 % (ref 39.0–52.0)
Hemoglobin: 15.9 g/dL (ref 13.0–17.0)
Immature Granulocytes: 0 %
Lymphocytes Relative: 19 %
Lymphs Abs: 2 10*3/uL (ref 0.7–4.0)
MCH: 28.4 pg (ref 26.0–34.0)
MCHC: 32.9 g/dL (ref 30.0–36.0)
MCV: 86.4 fL (ref 80.0–100.0)
Monocytes Absolute: 0.6 10*3/uL (ref 0.1–1.0)
Monocytes Relative: 5 %
Neutro Abs: 7.9 10*3/uL — ABNORMAL HIGH (ref 1.7–7.7)
Neutrophils Relative %: 74 %
Platelets: 247 10*3/uL (ref 150–400)
RBC: 5.6 MIL/uL (ref 4.22–5.81)
RDW: 14.2 % (ref 11.5–15.5)
WBC: 10.7 10*3/uL — ABNORMAL HIGH (ref 4.0–10.5)
nRBC: 0 % (ref 0.0–0.2)

## 2022-04-09 MED ORDER — ACETAMINOPHEN 325 MG PO TABS
650.0000 mg | ORAL_TABLET | Freq: Once | ORAL | Status: AC
  Administered 2022-04-09: 650 mg via ORAL
  Filled 2022-04-09: qty 2

## 2022-04-09 MED ORDER — HYDROCODONE-ACETAMINOPHEN 5-325 MG PO TABS: 1.0000 | ORAL_TABLET | Freq: Four times a day (QID) | ORAL | 0 refills | Status: DC | PRN

## 2022-04-09 MED ORDER — VALACYCLOVIR HCL 1 G PO TABS: 1000.0000 mg | ORAL_TABLET | Freq: Three times a day (TID) | ORAL | 0 refills | Status: DC

## 2022-04-09 MED ORDER — HYDROCODONE-ACETAMINOPHEN 5-325 MG PO TABS: 1.0000 | ORAL_TABLET | Freq: Once | ORAL | Status: DC

## 2022-04-09 MED ORDER — VALACYCLOVIR HCL 500 MG PO TABS
1000.0000 mg | ORAL_TABLET | Freq: Once | ORAL | Status: AC
  Administered 2022-04-09: 1000 mg via ORAL
  Filled 2022-04-09: qty 2

## 2022-04-09 MED ORDER — FLUORESCEIN SODIUM 1 MG OP STRP
1.0000 | ORAL_STRIP | Freq: Once | OPHTHALMIC | Status: AC
  Administered 2022-04-09: 1 via OPHTHALMIC
  Filled 2022-04-09: qty 1

## 2022-04-09 MED ORDER — TETRACAINE HCL 0.5 % OP SOLN
2.0000 [drp] | Freq: Once | OPHTHALMIC | Status: AC
  Administered 2022-04-09: 2 [drp] via OPHTHALMIC
  Filled 2022-04-09: qty 4

## 2022-04-09 NOTE — ED Triage Notes (Signed)
Per pt painful red rash to R upper forehead  x 1 week, eye pain that worsened on Saturday, stabbing head pain, ear pain and facial pain x 1 week also. Pt has a t shunt to drain an arachnoid cyst. EDP to triage to [facility].

## 2022-04-09 NOTE — ED Provider Notes (Signed)
[facility]  Provider Note  CSN: [id number] Arrival date & time: 04/09/22 0258  History Chief Complaint  Patient presents with   Headache    Cody Sullivan is a 46 y.o. 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 He reports several days of worsening, severe sharp, burning pain in R side of his face, R eye and radiating into his occipital area. He denies any fevers or blurry vision. Pain is not typical for him.    Home Medications Prior to Admission medications   Medication Sig Start Date End Date Taking? Authorizing Provider  HYDROcodone-acetaminophen (NORCO/VICODIN) 5-325 MG tablet Take 1 tablet by mouth every 6 (six) hours as needed for severe pain. 04/09/22  Yes Truddie Hidden, MD  valACYclovir (VALTREX) 1000 MG tablet Take 1 tablet (1,000 mg total) by mouth 3 (three) times daily. 04/09/22  Yes Truddie Hidden, MD  acetaminophen (TYLENOL) 500 MG tablet Take 1,000 mg by mouth every 6 (six) hours as needed for mild pain.    [provider]  atorvastatin (LIPITOR) 20 MG tablet TAKE 1 TABLET BY MOUTH EVERY DAY 04/02/22   Shamleffer, Melanie Crazier, MD  b complex vitamins capsule Take 1 capsule by mouth daily.    [provider]  Blood Glucose Monitoring Suppl (CONTOUR NEXT EZ) w/Device KIT 1 Device by Does not apply route 2 (two) times daily. 04/10/18   Shamleffer, Melanie Crazier, MD  glucose blood (CONTOUR NEXT TEST) test strip Use as instructed to check blood sugar once daily 07/26/20   Shamleffer, Melanie Crazier, MD  lisinopril-hydrochlorothiazide (PRINZIDE,ZESTORETIC) 20-25 MG tablet TAKE 1 TABLET BY MOUTH EVERY DAY Patient taking differently: Take 1 tablet by mouth daily. 08/14/18   Richardo Priest, MD  meloxicam (MOBIC) 15 MG tablet Take 15 mg by mouth daily. 10/23/21   [provider]  methocarbamol (ROBAXIN) 500 MG tablet Take 500 mg by mouth every 6 (six) hours  as needed for muscle spasms. Patient not taking: Reported on 02/22/2022 07/08/20   [provider]  Kinder MISC Use to check blood sugars once a day with Contour Next glucometer 04/10/18   Shamleffer, Melanie Crazier, MD  NEEDLE, DISP, 22 G 22G X 1" MISC 1 Device by Does not apply route every 14 (fourteen) days. 10/30/21   Shamleffer, Melanie Crazier, MD  NON FORMULARY Golo dietary supplements    [provider]  SYRINGE-NEEDLE, DISP, 3 ML 25G X 5/8" 3 ML MISC 1 Device by Does not apply route every 14 (fourteen) days. 11/01/21   Shamleffer, Melanie Crazier, MD  testosterone cypionate (DEPOTESTOSTERONE CYPIONATE) 200 MG/ML injection Inject 0.5 mLs (100 mg total) into the muscle every 14 (fourteen) days. 02/22/22   Shamleffer, Melanie Crazier, MD     Allergies    Patient has no known allergies.   Review of Systems   Review of Systems Please see HPI for pertinent positives and negatives  Physical Exam BP 123/81   Pulse 72   Temp 98.4 F (36.9 C) (Oral)   Resp 18   Ht _0  (1.778 m)   Wt 113.4 kg   SpO2 94%   BMI 35.87 kg/m   Physical Exam Vitals and nursing note reviewed.  Constitutional:      Appearance: Normal appearance.  HENT:     Head: Normocephalic and atraumatic.     Comments: Shingles rash to R forehead, no Hutchinson's sign,     Right  Ear: Tympanic membrane normal.     Left Ear: Tympanic membrane normal.     Nose: Nose normal.     Mouth/Throat:     Mouth: Mucous membranes are moist.  Eyes:     Extraocular Movements: Extraocular movements intact.     Conjunctiva/sclera: Conjunctivae normal.     Pupils: Pupils are equal, round, and reactive to light.     Comments: globe appears grossly normal, some erythema to right lower lid, no pain with light   Cardiovascular:     Rate and Rhythm: Normal rate.  Pulmonary:     Effort: Pulmonary effort is normal.     Breath sounds: Normal breath sounds.  Abdominal:     General: Abdomen is flat.      Palpations: Abdomen is soft.     Tenderness: There is no abdominal tenderness.  Musculoskeletal:        General: No swelling. Normal range of motion.     Cervical back: Neck supple.  Skin:    General: Skin is warm and dry.  Neurological:     General: No focal deficit present.     Mental Status: He is alert.  Psychiatric:        Mood and Affect: Mood normal.     ED Results / Procedures / Treatments   EKG None  Procedures Procedures  Medications Ordered in the ED Medications  fluorescein ophthalmic strip 1 strip (has no administration in time range)  tetracaine (PONTOCAINE) 0.5 % ophthalmic solution 2 drop (has no administration in time range)  valACYclovir (VALTREX) tablet 1,000 mg (has no administration in time range)    Initial Impression and Plan  Patient with exam concerning for shingles on his R V1 nerve distribution. Given his history of arachnoid cyst and shunt, will check CT and basic labs as well. Will need fluorescein stain to eval dendritic lesions.   ED Course   Clinical Course as of 04/09/22 0505  Mon Apr 09, 2022  0457 CBC and BMP are unremarkable.  [CS]  [id number] I personally viewed the images from radiology studies and agree with radiologist interpretation: CT demonstrates known arachnoid cyst. He has tubing in the area, similar in description to MRI done in [facility] system earlier in the year. He does not have any fluorescein uptake on Wood's lamp exam. Plan treatment for zoster, does not currently have signs of ocular involvement or meningoencephalitis. Rx for Valtrex and Norco for pain. Recommend close PCP and Ophtho follow up for recheck and RTED for any worsening symptoms, fever, confusion, etc.  [CS]    Clinical Course User Index [CS] Truddie Hidden, MD     MDM Rules/Calculators/A&P Medical Decision Making Problems Addressed: Herpes zoster without complication: acute illness or injury  Amount and/or Complexity of Data Reviewed Labs: ordered.  Decision-making details documented in ED Course. Radiology: ordered and independent interpretation performed. Decision-making details documented in ED Course.  Risk Prescription drug management.    Final Clinical Impression(s) / ED Diagnoses Final diagnoses:  Herpes zoster without complication    Rx / DC Orders ED Discharge Orders          Ordered    valACYclovir (VALTREX) 1000 MG tablet  3 times daily        04/09/22 0505    HYDROcodone-acetaminophen (NORCO/VICODIN) 5-325 MG tablet  Every 6 hours PRN        04/09/22 0505             Truddie Hidden, MD 04/09/22 [phone]

## 2022-04-23 ENCOUNTER — Other Ambulatory Visit (HOSPITAL_BASED_OUTPATIENT_CLINIC_OR_DEPARTMENT_OTHER): Payer: Self-pay

## 2022-04-25 ENCOUNTER — Other Ambulatory Visit (HOSPITAL_BASED_OUTPATIENT_CLINIC_OR_DEPARTMENT_OTHER): Payer: Self-pay

## 2022-05-03 ENCOUNTER — Ambulatory Visit: Payer: BC Managed Care – PPO | Admitting: Internal Medicine

## 2022-05-21 ENCOUNTER — Other Ambulatory Visit (HOSPITAL_BASED_OUTPATIENT_CLINIC_OR_DEPARTMENT_OTHER): Payer: Self-pay

## 2022-06-12 ENCOUNTER — Other Ambulatory Visit (HOSPITAL_BASED_OUTPATIENT_CLINIC_OR_DEPARTMENT_OTHER): Payer: Self-pay

## 2022-06-13 ENCOUNTER — Other Ambulatory Visit (HOSPITAL_BASED_OUTPATIENT_CLINIC_OR_DEPARTMENT_OTHER): Payer: Self-pay

## 2022-07-17 ENCOUNTER — Encounter (HOSPITAL_BASED_OUTPATIENT_CLINIC_OR_DEPARTMENT_OTHER): Payer: Self-pay | Admitting: Pharmacist

## 2022-07-17 ENCOUNTER — Other Ambulatory Visit (HOSPITAL_BASED_OUTPATIENT_CLINIC_OR_DEPARTMENT_OTHER): Payer: Self-pay

## 2022-07-17 MED ORDER — DULERA 200-5 MCG/ACT IN AERO
2.0000 | INHALATION_SPRAY | Freq: Two times a day (BID) | RESPIRATORY_TRACT | 1 refills | Status: AC
  Filled 2022-07-17: qty 13, 30d supply, fill #0

## 2022-07-17 MED ORDER — HYDROCOD POLI-CHLORPHE POLI ER 10-8 MG/5ML PO SUER
5.0000 mL | Freq: Two times a day (BID) | ORAL | 0 refills | Status: DC | PRN
  Filled 2022-07-17: qty 70, 7d supply, fill #0

## 2022-07-18 ENCOUNTER — Other Ambulatory Visit (HOSPITAL_BASED_OUTPATIENT_CLINIC_OR_DEPARTMENT_OTHER): Payer: Self-pay

## 2022-07-19 ENCOUNTER — Other Ambulatory Visit (HOSPITAL_BASED_OUTPATIENT_CLINIC_OR_DEPARTMENT_OTHER): Payer: Self-pay

## 2022-07-24 ENCOUNTER — Other Ambulatory Visit (HOSPITAL_BASED_OUTPATIENT_CLINIC_OR_DEPARTMENT_OTHER): Payer: Self-pay

## 2022-07-25 ENCOUNTER — Other Ambulatory Visit (HOSPITAL_BASED_OUTPATIENT_CLINIC_OR_DEPARTMENT_OTHER): Payer: Self-pay

## 2022-07-25 MED ORDER — FLUTICASONE-SALMETEROL 250-50 MCG/ACT IN AEPB
1.0000 | INHALATION_SPRAY | Freq: Two times a day (BID) | RESPIRATORY_TRACT | 1 refills | Status: AC
  Filled 2022-07-25: qty 60, 30d supply, fill #0

## 2022-08-03 ENCOUNTER — Other Ambulatory Visit (HOSPITAL_BASED_OUTPATIENT_CLINIC_OR_DEPARTMENT_OTHER): Payer: Self-pay

## 2022-08-05 ENCOUNTER — Other Ambulatory Visit (HOSPITAL_BASED_OUTPATIENT_CLINIC_OR_DEPARTMENT_OTHER): Payer: Self-pay

## 2022-08-06 ENCOUNTER — Other Ambulatory Visit (HOSPITAL_BASED_OUTPATIENT_CLINIC_OR_DEPARTMENT_OTHER): Payer: Self-pay

## 2022-08-07 ENCOUNTER — Other Ambulatory Visit (HOSPITAL_BASED_OUTPATIENT_CLINIC_OR_DEPARTMENT_OTHER): Payer: Self-pay

## 2022-08-08 ENCOUNTER — Encounter: Payer: Self-pay | Admitting: Internal Medicine

## 2022-08-08 ENCOUNTER — Other Ambulatory Visit (HOSPITAL_BASED_OUTPATIENT_CLINIC_OR_DEPARTMENT_OTHER): Payer: Self-pay

## 2022-08-08 MED ORDER — "SYRINGE/NEEDLE (DISP) 25G X 5/8"" 3 ML MISC": 1.0000 | 3 refills | Status: AC

## 2022-08-08 MED ORDER — TESTOSTERONE CYPIONATE 200 MG/ML IM SOLN: 100.0000 mg | INTRAMUSCULAR | 5 refills | Status: DC

## 2022-08-24 ENCOUNTER — Other Ambulatory Visit (INDEPENDENT_AMBULATORY_CARE_PROVIDER_SITE_OTHER): Payer: BC Managed Care – PPO

## 2022-08-24 DIAGNOSIS — E291 Testicular hypofunction: Secondary | ICD-10-CM

## 2022-08-24 LAB — CBC
HCT: 49.5 % (ref 39.0–52.0)
Hemoglobin: 16.6 g/dL (ref 13.0–17.0)
MCHC: 33.5 g/dL (ref 30.0–36.0)
MCV: 89.2 fl (ref 78.0–100.0)
Platelets: 267 10*3/uL (ref 150.0–400.0)
RBC: 5.56 Mil/uL (ref 4.22–5.81)
RDW: 14.7 % (ref 11.5–15.5)
WBC: 9.2 10*3/uL (ref 4.0–10.5)

## 2022-08-24 LAB — COMPREHENSIVE METABOLIC PANEL
ALT: 27 U/L (ref 0–53)
AST: 20 U/L (ref 0–37)
Albumin: 4.1 g/dL (ref 3.5–5.2)
Alkaline Phosphatase: 55 U/L (ref 39–117)
BUN: 10 mg/dL (ref 6–23)
CO2: 28 mEq/L (ref 19–32)
Calcium: 10 mg/dL (ref 8.4–10.5)
Chloride: 98 mEq/L (ref 96–112)
Creatinine, Ser: 0.88 mg/dL (ref 0.40–1.50)
GFR: 102.03 mL/min (ref >60.00)
Glucose, Bld: 109 mg/dL — ABNORMAL HIGH (ref 70–99)
Potassium: 3.9 mEq/L (ref 3.5–5.1)
Sodium: 136 mEq/L (ref 135–145)
Total Bilirubin: 0.9 mg/dL (ref 0.2–1.2)
Total Protein: 7.1 g/dL (ref 6.0–8.3)

## 2022-08-24 LAB — TESTOSTERONE: Testosterone: 739.65 ng/dL (ref 300.00–890.00)

## 2022-08-24 LAB — PSA: PSA: 0.97 ng/mL (ref 0.10–4.00)

## 2022-08-28 ENCOUNTER — Encounter: Payer: Self-pay | Admitting: Internal Medicine

## 2022-08-28 ENCOUNTER — Ambulatory Visit (INDEPENDENT_AMBULATORY_CARE_PROVIDER_SITE_OTHER): Payer: BC Managed Care – PPO | Admitting: Internal Medicine

## 2022-08-28 VITALS — BP 134/82 | HR 83 | Ht 70.0 in | Wt 280.0 lb

## 2022-08-28 DIAGNOSIS — D352 Benign neoplasm of pituitary gland: Secondary | ICD-10-CM | POA: Diagnosis not present

## 2022-08-28 DIAGNOSIS — E291 Testicular hypofunction: Secondary | ICD-10-CM | POA: Diagnosis not present

## 2022-08-28 DIAGNOSIS — E785 Hyperlipidemia, unspecified: Secondary | ICD-10-CM

## 2022-08-28 DIAGNOSIS — E1142 Type 2 diabetes mellitus with diabetic polyneuropathy: Secondary | ICD-10-CM | POA: Diagnosis not present

## 2022-08-28 LAB — POCT GLYCOSYLATED HEMOGLOBIN (HGB A1C): Hemoglobin A1C: 6.3 % — AB (ref 4.0–5.6)

## 2022-08-28 MED ORDER — LISINOPRIL-HYDROCHLOROTHIAZIDE 20-25 MG PO TABS: 1.0000 | ORAL_TABLET | Freq: Every day | ORAL | 3 refills | Status: AC

## 2022-08-28 MED ORDER — ATORVASTATIN CALCIUM 20 MG PO TABS: 20.0000 mg | ORAL_TABLET | Freq: Every day | ORAL | 3 refills | Status: DC

## 2022-08-28 NOTE — Progress Notes (Signed)
Name: Cody Sullivan  Age/ Sex: 47 y.o., male   MRN/ DOB: [id number], Jun 17, 1976     PCP: Cody Loader, FNP   Reason for Endocrinology Evaluation: Type 2 Diabetes Mellitus  Initial Endocrine Consultative Visit: 04/10/18    PATIENT IDENTIFIER: [name] is a 47 y.o. male with a past medical history of OSA on CPAP, dyslipidemia and obesity . The patient has followed with Endocrinology clinic since 04/10/18 for consultative assistance with management of his diabetes.  DIABETIC HISTORY:  Cody Sullivan was diagnosed with T2DM in 2019, her was offered Metformin at the time of his diagnosis by his PCP., but the patient opted for diet control and lifestyle changes. His hemoglobin A1c has ranged from 6.3%  in 10/2017, peaking at 6.5% in 03/2018 .   Low Testosterone:   He was found to have a low testosterone in 03/2018 ( T. Testosterone 126 ng/dL) with inappropriately normal LH/FSH. 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   He is S/P Right frontal craniotomy on 08/04/2018 through Cody Sullivan   He has been tired since 2017, he has noted somewhat decrease in libido, no difficulty with erections. He has noted gynecomastia for a while, no pain or discharge from the nipples. He also noted a subjective decrease in testicular size ~ 01/2018. He has 2 biological kids. He has not noticed any decrease in frequency of shaving.  He has chronic occasional headaches.     No known FH  of prostate cancer.   Testosterone started 01/2019  SUBJECTIVE:   Today (08/28/2022): Cody Sullivan is here for afollow up on his diabetes management and hypogonadism. He checks his blood sugars 0  time a week. The patient has not had hypoglycemic episodes since the last clinic visit.    He continues to follow up with  Dr. Owens Shark (neurosurgery) for back issues He is S/P right frontal craniotomy on 08/04/2018 for arachnoid  cysts.   He has been noted with weight gain since stopping the [employer], as it became cost prohibitive  Denies rash  Energy levels is low  Denies nausea, vomiting  Denies vision changes  Continues with chronic headache   HOME ENDOCRINE REGIMEN:  Testosterone Cypionate '200mg'$ /mL , 100 mg  Q14  Atorvastatin 20 mg daily       METER DOWNLOAD SUMMARY: did not bring      DIABETIC COMPLICATIONS: Microvascular complications:   Denies: Neuropathy, CKD or retinopathy  Last Eye Exam: Completed 2023   Macrovascular complications:   Denies: CAD, CVA, PVD   HISTORY:  Past Medical History:  Past Medical History:  Diagnosis Date   Allergy    Diabetes mellitus without complication ([place])    Hypertension    Kidney stones    Sleep apnea    cpap   Past Surgical History:  Past Surgical History:  Procedure Laterality Date   ANKLE SURGERY  2003   right    cracked chest  2007   equipment fell on chest and cracked his chest, had pain related to injury, cardiologist confirmed not related to heart.     CRANIOTOMY Right 08/04/2018   Right frontal - cyst removal   INCISION AND DRAINAGE PERIRECTAL ABSCESS N/A 07/18/2020   Procedure: IRRIGATION AND DEBRIDEMENT PERIRECTAL ABSCESS;  Surgeon: Cody Morn, MD;  Location: [facility];  Service: General;  Laterality: N/A;   LITHOTRIPSY  2010  nose fracture  2000   Social History:  reports that he has never smoked. He has never used smokeless tobacco. He reports that he does not drink alcohol and does not use drugs. Family History: No family history of prostate ca.    HOME MEDICATIONS: Allergies as of 08/28/2022   No Known Allergies      Medication List        Accurate as of August 28, 2022 10:21 AM. If you have any questions, ask your nurse or doctor.          STOP taking these medications    HYDROcodone-acetaminophen 5-325 MG tablet Commonly known as: NORCO/VICODIN Stopped by: Cody Sciara, MD   meloxicam  15 MG tablet Commonly known as: MOBIC Stopped by: Cody Sciara, MD   methocarbamol 500 MG tablet Commonly known as: ROBAXIN Stopped by: Cody Sciara, MD       TAKE these medications    acetaminophen 500 MG tablet Commonly known as: TYLENOL Take 1,000 mg by mouth every 6 (six) hours as needed for mild pain.   atorvastatin 20 MG tablet Commonly known as: LIPITOR TAKE 1 TABLET BY MOUTH EVERY DAY   b complex vitamins capsule Take 1 capsule by mouth daily.   chlorpheniramine-HYDROcodone 10-8 MG/5ML Commonly known as: TUSSIONEX Take 5 mLs by mouth every 12 (twelve) hours.   Contour Next EZ w/Device Kit 1 Device by Does not apply route 2 (two) times daily.   Contour Next Test test strip Generic drug: glucose blood Use as instructed to check blood sugar once daily   Dulera 200-5 MCG/ACT Aero Generic drug: mometasone-formoterol Inhale 2 puffs into the lungs 2 (two) times daily.   fluticasone-salmeterol 250-50 MCG/ACT Aepb Commonly known as: Advair Diskus Inhale 1 puff into the lungs 2 (two) times daily.   lisinopril-hydrochlorothiazide 20-25 MG tablet Commonly known as: ZESTORETIC TAKE 1 TABLET BY MOUTH EVERY DAY   Microlet Lancets Misc Use to check blood sugars once a day with Contour Next glucometer   NEEDLE (DISP) 22 G 22G X 1" Misc 1 Device by Does not apply route every 14 (fourteen) days.   NON FORMULARY Golo dietary supplements   SYRINGE-NEEDLE (DISP) 3 ML 25G X 5/8" 3 ML Misc Use as directed every 14 days. (1 Device by Does not apply route every 14 (fourteen) days.)   testosterone cypionate 200 MG/ML injection Commonly known as: DEPOTESTOSTERONE CYPIONATE Inject 0.5 mLs (100 mg total) into the muscle every 14 (fourteen) days.   valACYclovir 1000 MG tablet Commonly known as: VALTREX Take 1 tablet (1,000 mg total) by mouth 3 (three) times daily.          OBJECTIVE:   Vital Signs: BP 134/82 (BP Location: Left Arm, Patient Position:  Sitting, Cuff Size: Large)   Pulse 83   Ht '5\' 10"'$  (1.778 m)   Wt 280 lb (127 kg)   SpO2 95%   BMI 40.18 kg/m   Wt Readings from Last 3 Encounters:  08/28/22 280 lb (127 kg)  04/09/22 250 lb (113.4 kg)  02/22/22 256 lb (116.1 kg)     Exam: General: Pt appears well and is in NAD  Lungs: Clear with good BS bilat with no rales, rhonchi, or wheezes  Heart: RRR   Extremities:  Trace edema around ankles  Neuro: MS is good with appropriate affect, pt is alert and Ox3    DM foot exam: 08/28/2022 The skin of the feet is intact without sores or ulcerations.  The pedal pulses  are 2+ on right and 2+ on left. The sensation is decreased to a screening 5.07, 10 gram monofilament bilaterally at the toes.      DATA REVIEWED:   Lab Results  Component Value Date   HGBA1C 5.7 (A) 02/22/2022   HGBA1C 6.8 (H) 10/23/2021   HGBA1C 6.5 04/24/2021    Latest Reference Range & Units 08/24/22 08:09  Sodium 135 - 145 mEq/L 136  Potassium 3.5 - 5.1 mEq/L 3.9  Chloride 96 - 112 mEq/L 98  CO2 19 - 32 mEq/L 28  Glucose 70 - 99 mg/dL 109 (H)  BUN 6 - 23 mg/dL 10  Creatinine 0.40 - 1.50 mg/dL 0.88  Calcium 8.4 - 10.5 mg/dL 10.0  Alkaline Phosphatase 39 - 117 U/L 55  Albumin 3.5 - 5.2 g/dL 4.1  AST 0 - 37 U/L 20  ALT 0 - 53 U/L 27  Total Protein 6.0 - 8.3 g/dL 7.1  Total Bilirubin 0.2 - 1.2 mg/dL 0.9  GFR >60.00 mL/min 102.03    Latest Reference Range & Units 08/24/22 08:09  WBC 4.0 - 10.5 K/uL 9.2  RBC 4.22 - 5.81 Mil/uL 5.56  Hemoglobin 13.0 - 17.0 g/dL 16.6  HCT 39.0 - 52.0 % 49.5  MCV 78.0 - 100.0 fl 89.2  MCHC 30.0 - 36.0 g/dL 33.5  RDW 11.5 - 15.5 % 14.7  Platelets 150.0 - 400.0 K/uL 267.0    Latest Reference Range & Units 08/24/22 08:09  Testosterone 300.00 - 890.00 ng/dL 739.65  PSA 0.10 - 4.00 ng/mL 0.97     MRI 08/23/2020  08/23/2020 8:20 AM EST   IMPRESSION:   1. No significant change since 08/25/2019.  2. 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  3.  Normal MRI of the sella/pituitary gland without and with contrast. ASSESSMENT / PLAN / RECOMMENDATIONS:   1)  Type 2 Diabetes Mellitus, in remission with neuropathy - Most recent A1c of 6.3 %. Goal A1c < 7.0 %.    -A1c continues at goal  -He will continue with diet and exercise   EDUCATION / INSTRUCTIONS: BG monitoring instructions: Patient is instructed to check his blood sugars 2 times a week, fasting .     2) Pituitary Microadenoma:   - MRI 08/2020 shows stable right middle arachnoid cyst, no evidence of pituitary microadenoma on this MRI  - Clinically and biochemicaly there's no evidence of hyper or hyposecretion except for low testosterone -Continues to follow-up with neurosurgery    3) Hypogonadotrophic hypogonadism:    - Testosterone level within normal range  -We have opted to switch to IM testosterone in May 2023 - PSA and Hct normal  Medications  Continue testosterone cypionate 200 mg/mL, take 100 mg  ( 0.5 mL) q. 14 days   4) Dyslipidemia :  -Lipid panel acceptable -Continue current dose of atorvastatin   Medication Continue atorvastatin 20 mg QHS  5) HTN:  - BP well controlled  - Pt needs refill on lisinopril-HCTZ which was sent today    F/U in 6 months    Signed electronically by: Mack Guise, MD  [facility]  [address], [place] [address] Phone: [phone] FAX: [phone]   CC: Cody Sullivan, [place] [address] Phone: [phone]  Fax: [phone]  Return to Endocrinology clinic as below: Future Appointments  Date Time Provider [place]  08/28/2022 10:30 AM Breyden Jeudy, Melanie Crazier, MD [facility] None

## 2022-10-25 ENCOUNTER — Encounter: Payer: Self-pay | Admitting: Internal Medicine

## 2022-10-26 ENCOUNTER — Telehealth: Payer: Self-pay

## 2022-10-26 NOTE — Telephone Encounter (Signed)
Patient Advocate Encounter   Received notification from pt msgs that prior authorization is required for Testosterone Cypionate 200MG /ML  Submitted: 10/26/22 Key [id number]  Status is pending

## 2022-11-01 ENCOUNTER — Other Ambulatory Visit (HOSPITAL_COMMUNITY): Payer: Self-pay

## 2022-11-14 ENCOUNTER — Other Ambulatory Visit (HOSPITAL_COMMUNITY): Payer: Self-pay

## 2022-11-19 NOTE — Telephone Encounter (Signed)
Pt has been notified.

## 2022-11-19 NOTE — Telephone Encounter (Signed)
It looks like an error occurred in [facility] when the PA was submitted  PA resubmitted for expedited review Key [id number]  Status is APPROVED  Effective through 11/19/2023

## 2022-11-19 NOTE — Telephone Encounter (Signed)
Good Morning,  Mr. Burford has reached out to [place] stating that he still needs this PA done and that his insurance never received anything regarding this. Can we please check on PA.   Thanks,

## 2022-12-15 ENCOUNTER — Other Ambulatory Visit: Payer: Self-pay | Admitting: Internal Medicine

## 2022-12-17 ENCOUNTER — Other Ambulatory Visit: Payer: Self-pay

## 2022-12-17 MED ORDER — TESTOSTERONE CYPIONATE 200 MG/ML IM SOLN: 100.0000 mg | INTRAMUSCULAR | 0 refills | Status: DC

## 2022-12-17 NOTE — Telephone Encounter (Signed)
Last Refill: 08/08/22 Last OV: 08/28/22 Pending OV: 03/01/23  Medication: Testosterone   Medication has been pended

## 2023-01-07 ENCOUNTER — Other Ambulatory Visit: Payer: Self-pay

## 2023-01-07 ENCOUNTER — Other Ambulatory Visit: Payer: Self-pay | Admitting: Internal Medicine

## 2023-01-07 DIAGNOSIS — E291 Testicular hypofunction: Secondary | ICD-10-CM

## 2023-01-07 MED ORDER — TESTOSTERONE CYPIONATE 200 MG/ML IM SOLN: 100.0000 mg | INTRAMUSCULAR | 5 refills | Status: DC

## 2023-01-08 ENCOUNTER — Other Ambulatory Visit: Payer: Self-pay | Admitting: Internal Medicine

## 2023-01-08 DIAGNOSIS — E291 Testicular hypofunction: Secondary | ICD-10-CM

## 2023-01-08 MED ORDER — TESTOSTERONE CYPIONATE 200 MG/ML IM SOLN: 100.0000 mg | INTRAMUSCULAR | 5 refills | Status: DC

## 2023-02-25 ENCOUNTER — Other Ambulatory Visit (INDEPENDENT_AMBULATORY_CARE_PROVIDER_SITE_OTHER): Payer: 59

## 2023-02-25 DIAGNOSIS — E291 Testicular hypofunction: Secondary | ICD-10-CM | POA: Diagnosis not present

## 2023-02-25 DIAGNOSIS — D352 Benign neoplasm of pituitary gland: Secondary | ICD-10-CM

## 2023-02-25 DIAGNOSIS — E785 Hyperlipidemia, unspecified: Secondary | ICD-10-CM

## 2023-02-25 DIAGNOSIS — E1142 Type 2 diabetes mellitus with diabetic polyneuropathy: Secondary | ICD-10-CM

## 2023-02-25 LAB — CBC
HCT: 52.1 % — ABNORMAL HIGH (ref 39.0–52.0)
Hemoglobin: 17 g/dL (ref 13.0–17.0)
MCHC: 32.6 g/dL (ref 30.0–36.0)
MCV: 91.1 fl (ref 78.0–100.0)
Platelets: 251 10*3/uL (ref 150.0–400.0)
RBC: 5.72 Mil/uL (ref 4.22–5.81)
RDW: 14.6 % (ref 11.5–15.5)
WBC: 9.7 10*3/uL (ref 4.0–10.5)

## 2023-02-25 LAB — LIPID PANEL
Cholesterol: 150 mg/dL (ref 0–200)
HDL: 38.5 mg/dL — ABNORMAL LOW (ref >39.00)
LDL Cholesterol: 76 mg/dL (ref 0–99)
NonHDL: 111.03
Total CHOL/HDL Ratio: 4
Triglycerides: 173 mg/dL — ABNORMAL HIGH (ref 0.0–149.0)
VLDL: 34.6 mg/dL (ref 0.0–40.0)

## 2023-02-25 LAB — MICROALBUMIN / CREATININE URINE RATIO
Creatinine,U: 350.4 mg/dL
Microalb Creat Ratio: 0.5 mg/g (ref 0.0–30.0)
Microalb, Ur: 1.9 mg/dL (ref 0.0–1.9)

## 2023-02-25 LAB — TESTOSTERONE: Testosterone: 394.48 ng/dL (ref 300.00–890.00)

## 2023-02-25 LAB — T4, FREE: Free T4: 0.95 ng/dL (ref 0.60–1.60)

## 2023-02-25 LAB — HEMOGLOBIN A1C: Hgb A1c MFr Bld: 6.7 % — ABNORMAL HIGH (ref 4.6–6.5)

## 2023-02-25 LAB — TSH: TSH: 3.04 u[IU]/mL (ref 0.35–5.50)

## 2023-03-01 ENCOUNTER — Ambulatory Visit: Payer: 59 | Admitting: Internal Medicine

## 2023-03-01 ENCOUNTER — Encounter: Payer: Self-pay | Admitting: Internal Medicine

## 2023-03-01 VITALS — BP 122/70 | HR 71 | Ht 70.0 in | Wt 278.0 lb

## 2023-03-01 DIAGNOSIS — E785 Hyperlipidemia, unspecified: Secondary | ICD-10-CM | POA: Diagnosis not present

## 2023-03-01 DIAGNOSIS — E1142 Type 2 diabetes mellitus with diabetic polyneuropathy: Secondary | ICD-10-CM | POA: Diagnosis not present

## 2023-03-01 DIAGNOSIS — E230 Hypopituitarism: Secondary | ICD-10-CM | POA: Diagnosis not present

## 2023-03-01 LAB — POCT GLUCOSE (DEVICE FOR HOME USE): POC Glucose: 148 mg/dL — AB (ref 70–99)

## 2023-03-01 MED ORDER — ATORVASTATIN CALCIUM 20 MG PO TABS: 20.0000 mg | ORAL_TABLET | Freq: Every day | ORAL | 3 refills | Status: DC

## 2023-03-01 NOTE — Progress Notes (Signed)
Name: Cody Sullivan  Age/ Sex: 47 y.o., male   MRN/ DOB: [id number], 09-28-1975     PCP: Soundra Pilon, FNP   Reason for Endocrinology Evaluation: Type 2 Diabetes Mellitus  Initial Endocrine Consultative Visit: 04/10/18    PATIENT IDENTIFIER: [name] is a 47 y.o. male with a past medical history of OSA on CPAP, dyslipidemia and obesity . The patient has followed with Endocrinology clinic since 04/10/18 for consultative assistance with management of his diabetes.  DIABETIC HISTORY:  Cody Sullivan was diagnosed with T2DM in 2019, her was offered Metformin at the time of his diagnosis by his PCP., but the patient opted for diet control and lifestyle changes. His hemoglobin A1c has ranged from 6.3%  in 10/2017, peaking at 6.5% in 03/2018 .   Low Testosterone:   He was found to have a low testosterone in 03/2018 ( T. Testosterone 126 ng/dL) with inappropriately normal LH/FSH. 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   He is S/P Right frontal craniotomy on 08/04/2018 through Dr. Gwendlyn Deutscher   He has been tired since 2017, he has noted somewhat decrease in libido, no difficulty with erections. He has noted gynecomastia for a while, no pain or discharge from the nipples. He also noted a subjective decrease in testicular size ~ 01/2018. He has 2 biological kids. He has not noticed any decrease in frequency of shaving.  He has chronic occasional headaches.     No known FH  of prostate cancer.   Testosterone started 01/2019  SUBJECTIVE:   Today (03/01/2023): Cody Sullivan is here for afollow up on his diabetes management and hypogonadism. He checks his blood sugars 0  time a week. The patient has not had hypoglycemic episodes since the last clinic visit.   He continues to follow-up with the sleep center for OSA, on CPAP He continues to follow up with  Dr. Manson Passey (neurosurgery) for back  issues, annually. He is S/P right frontal craniotomy on 08/04/2018 for arachnoid cysts.   Weight has been stable  Continues with chronic stable headaches No GI symptoms    HOME ENDOCRINE REGIMEN:  Testosterone Cypionate 200mg /mL , 100 mg  (0.24mL ) Q14  Atorvastatin 20 mg daily       METER DOWNLOAD SUMMARY: did not bring      DIABETIC COMPLICATIONS: Microvascular complications:   Denies: Neuropathy, CKD or retinopathy  Last Eye Exam: Completed 2023   Macrovascular complications:   Denies: CAD, CVA, PVD   HISTORY:  Past Medical History:  Past Medical History:  Diagnosis Date   Allergy    Diabetes mellitus without complication (HCC)    Hypertension    Kidney stones    Sleep apnea    cpap   Past Surgical History:  Past Surgical History:  Procedure Laterality Date   ANKLE SURGERY  2003   right    cracked chest  2007   equipment fell on chest and cracked his chest, had pain related to injury, cardiologist confirmed not related to heart.     CRANIOTOMY Right 08/04/2018   Right frontal - cyst removal   INCISION AND DRAINAGE PERIRECTAL ABSCESS N/A 07/18/2020   Procedure: IRRIGATION AND DEBRIDEMENT PERIRECTAL ABSCESS;  Surgeon: Quentin Ore, MD;  Location: [facility];  Service: General;  Laterality: N/A;   LITHOTRIPSY  2010   nose fracture  2000   Social History:  reports that he has never smoked. He has never used smokeless tobacco. He reports that he does not drink alcohol and does not use drugs. Family History: No family history of prostate ca.    HOME MEDICATIONS: Allergies as of 03/01/2023   No Known Allergies      Medication List        Accurate as of March 01, 2023  9:54 AM. If you have any questions, ask your nurse or doctor.          acetaminophen 500 MG tablet Commonly known as: TYLENOL Take 1,000 mg by mouth every 6 (six) hours as needed for mild pain.   atorvastatin 20 MG tablet Commonly known as: LIPITOR Take 1 tablet (20  mg total) by mouth daily.   b complex vitamins capsule Take 1 capsule by mouth daily.   chlorpheniramine-HYDROcodone 10-8 MG/5ML Commonly known as: TUSSIONEX Take 5 mLs by mouth every 12 (twelve) hours.   Contour Next EZ w/Device Kit 1 Device by Does not apply route 2 (two) times daily.   Contour Next Test test strip Generic drug: glucose blood Use as instructed to check blood sugar once daily   Dulera 200-5 MCG/ACT Aero Generic drug: mometasone-formoterol Inhale 2 puffs into the lungs 2 (two) times daily.   fluticasone-salmeterol 250-50 MCG/ACT Aepb Commonly known as: Advair Diskus Inhale 1 puff into the lungs 2 (two) times daily.   HYDROcodone-acetaminophen 5-325 MG tablet Commonly known as: NORCO/VICODIN 1 tablet as needed Orally every 6 hrs for 5 days   lisinopril-hydrochlorothiazide 20-25 MG tablet Commonly known as: ZESTORETIC Take 1 tablet by mouth daily.   meloxicam 15 MG tablet Commonly known as: MOBIC 1 tablet Orally Once a day for 90 days As needed for back pain   Microlet Lancets Misc Use to check blood sugars once a day with Contour Next glucometer   NEEDLE (DISP) 22 G 22G X 1" Misc 1 Device by Does not apply route every 14 (fourteen) days.   NON FORMULARY Golo dietary supplements   SYRINGE-NEEDLE (DISP) 3 ML 25G X 5/8" 3 ML Misc Use as directed every 14 days. (1 Device by Does not apply route every 14 (fourteen) days.)   testosterone cypionate 200 MG/ML injection Commonly known as: DEPOTESTOSTERONE CYPIONATE Inject 0.5 mLs (100 mg total) into the muscle every 14 (fourteen) days.   valACYclovir 1000 MG tablet Commonly known as: VALTREX Take 1 tablet (1,000 mg total) by mouth 3 (three) times daily.   Vitamin D3 25 MCG (1000 UT) Caps 1 capsule Orally Once a day          OBJECTIVE:   Vital Signs: BP 122/70 (BP Location: Left Arm, Patient Position: Sitting, Cuff Size: Large)   Pulse 71   Ht 5\' 10"  (1.778 m)   Wt 278 lb (126.1 kg)   SpO2  97%   BMI 39.89 kg/m   Wt Readings from Last 3 Encounters:  03/01/23 278 lb (126.1 kg)  08/28/22 280 lb (127 kg)  04/09/22 250 lb (113.4 kg)     Exam: General: Pt appears well and is in NAD  Lungs: Clear with good BS bilat with no rales, rhonchi, or wheezes  Heart: RRR   Extremities:  Trace edema around ankles  Neuro: MS is good with appropriate affect, pt is alert and Ox3    DM foot exam: 08/28/2022 The skin of the feet is intact without sores or ulcerations.  The pedal pulses are 2+ on right and 2+ on left. The sensation is decreased  to a screening 5.07, 10 gram monofilament bilaterally at the toes.      DATA REVIEWED:   Lab Results  Component Value Date   HGBA1C 6.7 (H) 02/25/2023   HGBA1C 6.3 (A) 08/28/2022   HGBA1C 5.7 (A) 02/22/2022     Latest Reference Range & Units 02/25/23 08:12  Total CHOL/HDL Ratio  4  Cholesterol 0 - 200 mg/dL 865  HDL Cholesterol >78.46 mg/dL 96.29 (L)  LDL (calc) 0 - 99 mg/dL 76  MICROALB/CREAT RATIO 0.0 - 30.0 mg/g 0.5  NonHDL  111.03  Triglycerides 0.0 - 149.0 mg/dL 528.4 (H)  VLDL 0.0 - 13.2 mg/dL 44.0  WBC 4.0 - 10.2 K/uL 9.7  RBC 4.22 - 5.81 Mil/uL 5.72  Hemoglobin 13.0 - 17.0 g/dL 72.5  HCT 36.6 - 44.0 % 52.1 (H)  MCV 78.0 - 100.0 fl 91.1  MCHC 30.0 - 36.0 g/dL 34.7  RDW 42.5 - 95.6 % 14.6  Platelets 150.0 - 400.0 K/uL 251.0    Latest Reference Range & Units 02/25/23 08:12  Hemoglobin A1C 4.6 - 6.5 % 6.7 (H)  Testosterone 300.00 - 890.00 ng/dL 387.56  TSH 4.33 - 2.95 uIU/mL 3.04  T4,Free(Direct) 0.60 - 1.60 ng/dL 1.88     Latest Reference Range & Units 02/25/23 08:12  Creatinine,U mg/dL 416.6  Microalb, Ur 0.0 - 1.9 mg/dL 1.9  MICROALB/CREAT RATIO 0.0 - 30.0 mg/g 0.5        Latest Reference Range & Units 08/24/22 08:09  Sodium 135 - 145 mEq/L 136  Potassium 3.5 - 5.1 mEq/L 3.9  Chloride 96 - 112 mEq/L 98  CO2 19 - 32 mEq/L 28  Glucose 70 - 99 mg/dL 063 (H)  BUN 6 - 23 mg/dL 10  Creatinine 0.16 - 0.10 mg/dL  9.32  Calcium 8.4 - 35.5 mg/dL 73.2  Alkaline Phosphatase 39 - 117 U/L 55  Albumin 3.5 - 5.2 g/dL 4.1  AST 0 - 37 U/L 20  ALT 0 - 53 U/L 27  Total Protein 6.0 - 8.3 g/dL 7.1  Total Bilirubin 0.2 - 1.2 mg/dL 0.9  GFR >20.25 mL/min 102.03      MRI 08/23/2020  08/23/2020 8:20 AM EST   IMPRESSION:   1. No significant change since 08/25/2019.  2. 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  3. Normal MRI of the sella/pituitary gland without and with contrast. ASSESSMENT / PLAN / RECOMMENDATIONS:   1)  Type 2 Diabetes Mellitus, in remission with neuropathy - Most recent A1c of 6.7 %. Goal A1c < 7.0 %.    -A1c continues at goal but it has been trending up -He will continue with diet and exercise  -A referral has been placed to our CDE to discuss low-carb diet  EDUCATION / INSTRUCTIONS: BG monitoring instructions: Patient is instructed to check his blood sugars 2 times a week, fasting .     2) Pituitary Microadenoma:   - MRI 08/2020 shows stable right middle arachnoid cyst, no evidence of pituitary microadenoma on this MRI  - Clinically  there's no evidence of hyper or hyposecretion except for low testosterone     3) Hypogonadotrophic hypogonadism:    -Testosterone remains within normal range  Medications  Continue testosterone cypionate 200 mg/mL, take 100 mg  ( 0.5 mL) q. 14 days   4) Dyslipidemia :  -Lipid panel shows slight elevation in triglyceride -LDL at goal   Medication Continue atorvastatin 20 mg QHS    F/U in 6 months    Signed electronically by:  Abby Raelyn Mora, MD  [facility] Endocrinology  [facility] [address] [place], [place] [address] Phone: [phone] FAX: [phone]   CC: Soundra Pilon, FNP [address] [place] [address] Phone: [phone]  Fax: [phone]  Return to Endocrinology clinic as below: No future appointments.

## 2023-06-17 ENCOUNTER — Other Ambulatory Visit: Payer: Self-pay | Admitting: Internal Medicine

## 2023-06-17 DIAGNOSIS — E291 Testicular hypofunction: Secondary | ICD-10-CM

## 2023-06-17 NOTE — Telephone Encounter (Signed)
Testosterone refill request complete

## 2023-07-15 ENCOUNTER — Other Ambulatory Visit (HOSPITAL_BASED_OUTPATIENT_CLINIC_OR_DEPARTMENT_OTHER): Payer: Self-pay

## 2023-07-15 MED FILL — Testosterone Cypionate IM Inj in Oil 200 MG/ML: INTRAMUSCULAR | 28 days supply | Qty: 2 | Fill #0 | Status: AC

## 2023-07-23 ENCOUNTER — Ambulatory Visit: Payer: Self-pay | Admitting: Surgery

## 2023-07-23 ENCOUNTER — Encounter (HOSPITAL_COMMUNITY): Payer: Self-pay | Admitting: General Surgery

## 2023-07-23 ENCOUNTER — Other Ambulatory Visit: Payer: Self-pay

## 2023-07-23 NOTE — H&P (View-Only) (Signed)
 History of Present Illness: Cody Sullivan is a 48 y.o. male who is seen today as an office consultation for evaluation of New Patient (Eval and drainage perirectal abscess )   Began having perianal pain and pressure about 6 days ago, which has gotten progressively worse.  When he sits, he feels severe shooting pain up into the pelvis.  He has not had any spontaneous drainage from the area.  He previously had a perianal abscess drained in 2022, and currently has similar symptoms.  He reports a fever of 100.4 yesterday.  He is afebrile here in the office.   He has a history of diabetes and sleep apnea, and last A1c was 6.7.  He does not take any blood thinners.     Review of Systems: A complete review of systems was obtained from the patient.  I have reviewed this information and discussed as appropriate with the patient.  See HPI as well for other ROS.     Medical History: Past Medical History Past Medical History: Diagnosis Date  Sleep apnea         Problem List There is no problem list on file for this patient.     Past Surgical History Past Surgical History: Procedure Laterality Date  cranio right Right        Allergies No Known Allergies    Medications Ordered Prior to Encounter Current Outpatient Medications on File Prior to Visit Medication Sig Dispense Refill  atorvastatin  (LIPITOR) 20 MG tablet Take 20 mg by mouth once daily      cholecalciferol 1000 unit tablet Take by mouth      lisinopriL -hydroCHLOROthiazide  (ZESTORETIC ) 20-25 mg tablet Take 1 tablet by mouth once daily      meloxicam (MOBIC) 15 MG tablet Take 15 mg by mouth once daily        No current facility-administered medications on file prior to visit.      Family History Family History Problem Relation Age of Onset  Colon cancer Mother        Tobacco Use History Social History    Tobacco Use Smoking Status Never Smokeless Tobacco Never      Social History Social History     Socioeconomic History  Marital status: Married Tobacco Use  Smoking status: Never  Smokeless tobacco: Never Substance and Sexual Activity  Alcohol use: Never  Drug use: Never    Social Drivers of Manufacturing Engineer Strain: High Risk (04/17/2023)   Received from [facility]   Overall Financial Resource Strain (CARDIA)    Difficulty of Paying Living Expenses: Very hard Food Insecurity: Food Insecurity Present (04/17/2023)   Received from [facility]   Hunger Vital Sign    Worried About Running Out of Food in the Last Year: Often true    Ran Out of Food in the Last Year: Often true Transportation Needs: No Transportation Needs (04/17/2023)   Received from [facility]   PRAPARE - Transportation    Lack of Transportation (Medical): No    Lack of Transportation (Non-Medical): No Physical Activity: Unknown (04/17/2023)   Received from [facility]   Exercise Vital Sign    Days of Exercise per Week: Patient declined Stress: Stress Concern Present (04/17/2023)   Received from [facility] - Occupational Stress Questionnaire    Feeling of Stress : Very much Social Connections: Socially Integrated (04/17/2023)   Received from [facility]   Social Network    How would  you rate your social network (family, work, friends)?: Good participation with social networks Housing Stability: Unknown (07/23/2023)   Housing Stability Vital Sign    Homeless in the Last Year: No      Objective:   Vitals:   07/23/23 1407 BP: 126/88 Pulse: 98 Temp: 37.1 C (98.8 F) SpO2: 98% Weight: (!) 125.8 kg (277 lb 6.4 oz) PainSc:   8 PainLoc: Buttocks   There is no height or weight on file to calculate BMI.   Physical Exam Vitals reviewed.  Constitutional:      General: He is not in acute distress. HENT:     Head: Normocephalic and atraumatic.  Pulmonary:     Effort: Pulmonary effort is normal. No respiratory distress.   Genitourinary:    Comments: Area of induration, erythema, and fluctuance posterior and just to the right of midline near the anal verge.  There is tenderness to palpation, with no spontaneous drainage. Skin:    General: Skin is warm and dry.     Coloration: Skin is not jaundiced.  Neurological:     General: No focal deficit present.     Mental Status: He is alert and oriented to person, place, and time.      Procedure Note: The perianal skin was prepped with chlorhexidine , and the area of induration was infiltrated with 1% lidocaine  with epinephrine .  A skin incision was made using an 11 blade scalpel, however there was no return of purulent drainage.  On probing the wound the abscess cavity is not identified.  The patient was significantly uncomfortable, and the procedure was aborted.     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  Based on review of his previous op note from 2022, this appears to be in the same location as previously, raising concern for an underlying perianal fistula.  I attempted drainage in the office today without success, and the patient was significantly uncomfortable.  Patient does not have systemic signs of sepsis.  I discussed drainage in the OR this evening, however he needs to pick up his child and would prefer wait until tomorrow.  Will arrange for this to be done in the operating room tomorrow at [facility] by Dr. Ebbie.  Patient was instructed to remain NPO after midnight tonight, and I sent a prescription for oral Augmentin  to start today, as well as hydrocodone   for pain relief. All questions were answered.   Leonor Dawn, MD [facility] Surgery General, Hepatobiliary and Pancreatic Surgery 07/23/23 3:21 PM

## 2023-07-23 NOTE — H&P (Signed)
 History of Present Illness: Cody Sullivan is a 48 y.o. male who is seen today as an office consultation for evaluation of New Patient (Eval and drainage perirectal abscess )   Began having perianal pain and pressure about 6 days ago, which has gotten progressively worse.  When he sits, he feels severe shooting pain up into the pelvis.  He has not had any spontaneous drainage from the area.  He previously had a perianal abscess drained in 2022, and currently has similar symptoms.  He reports a fever of 100.4 yesterday.  He is afebrile here in the office.   He has a history of diabetes and sleep apnea, and last A1c was 6.7.  He does not take any blood thinners.     Review of Systems: A complete review of systems was obtained from the patient.  I have reviewed this information and discussed as appropriate with the patient.  See HPI as well for other ROS.     Medical History: Past Medical History Past Medical History: Diagnosis Date  Sleep apnea         Problem List There is no problem list on file for this patient.     Past Surgical History Past Surgical History: Procedure Laterality Date  cranio right Right        Allergies No Known Allergies    Medications Ordered Prior to Encounter Current Outpatient Medications on File Prior to Visit Medication Sig Dispense Refill  atorvastatin  (LIPITOR) 20 MG tablet Take 20 mg by mouth once daily      cholecalciferol 1000 unit tablet Take by mouth      lisinopriL -hydroCHLOROthiazide  (ZESTORETIC ) 20-25 mg tablet Take 1 tablet by mouth once daily      meloxicam (MOBIC) 15 MG tablet Take 15 mg by mouth once daily        No current facility-administered medications on file prior to visit.      Family History Family History Problem Relation Age of Onset  Colon cancer Mother        Tobacco Use History Social History    Tobacco Use Smoking Status Never Smokeless Tobacco Never      Social History Social History     Socioeconomic History  Marital status: Married Tobacco Use  Smoking status: Never  Smokeless tobacco: Never Substance and Sexual Activity  Alcohol use: Never  Drug use: Never    Social Drivers of Manufacturing Engineer Strain: High Risk (04/17/2023)   Received from [facility]   Overall Financial Resource Strain (CARDIA)    Difficulty of Paying Living Expenses: Very hard Food Insecurity: Food Insecurity Present (04/17/2023)   Received from [facility]   Hunger Vital Sign    Worried About Running Out of Food in the Last Year: Often true    Ran Out of Food in the Last Year: Often true Transportation Needs: No Transportation Needs (04/17/2023)   Received from [facility]   PRAPARE - Transportation    Lack of Transportation (Medical): No    Lack of Transportation (Non-Medical): No Physical Activity: Unknown (04/17/2023)   Received from [facility]   Exercise Vital Sign    Days of Exercise per Week: Patient declined Stress: Stress Concern Present (04/17/2023)   Received from [facility]ational Health - Occupational Stress Questionnaire    Feeling of Stress : Very much Social Connections: Socially Integrated (04/17/2023)   Received from [facility]   Social Network    How would  you rate your social network (family, work, friends)?: Good participation with social networks Housing Stability: Unknown (07/23/2023)   Housing Stability Vital Sign    Homeless in the Last Year: No      Objective:   Vitals:   07/23/23 1407 BP: 126/88 Pulse: 98 Temp: 37.1 C (98.8 F) SpO2: 98% Weight: (!) 125.8 kg (277 lb 6.4 oz) PainSc:   8 PainLoc: Buttocks   There is no height or weight on file to calculate BMI.   Physical Exam Vitals reviewed.  Constitutional:      General: He is not in acute distress. HENT:     Head: Normocephalic and atraumatic.  Pulmonary:     Effort: Pulmonary effort is normal. No respiratory distress.   Genitourinary:    Comments: Area of induration, erythema, and fluctuance posterior and just to the right of midline near the anal verge.  There is tenderness to palpation, with no spontaneous drainage. Skin:    General: Skin is warm and dry.     Coloration: Skin is not jaundiced.  Neurological:     General: No focal deficit present.     Mental Status: He is alert and oriented to person, place, and time.      Procedure Note: The perianal skin was prepped with chlorhexidine , and the area of induration was infiltrated with 1% lidocaine  with epinephrine .  A skin incision was made using an 11 blade scalpel, however there was no return of purulent drainage.  On probing the wound the abscess cavity is not identified.  The patient was significantly uncomfortable, and the procedure was aborted.     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  Based on review of his previous op note from 2022, this appears to be in the same location as previously, raising concern for an underlying perianal fistula.  I attempted drainage in the office today without success, and the patient was significantly uncomfortable.  Patient does not have systemic signs of sepsis.  I discussed drainage in the OR this evening, however he needs to pick up his child and would prefer wait until tomorrow.  Will arrange for this to be done in the operating room tomorrow at [facility] by Dr. Ebbie.  Patient was instructed to remain NPO after midnight tonight, and I sent a prescription for oral Augmentin  to start today, as well as hydrocodone   for pain relief. All questions were answered.   Leonor Dawn, MD [facility] Surgery General, Hepatobiliary and Pancreatic Surgery 07/23/23 3:21 PM

## 2023-07-23 NOTE — Progress Notes (Signed)
 PCP - none Cardiologist - none Endocrinology - Dr Donell Butts  Chest x-ray - n/a EKG - DOS Stress Test - years ago per patient for work physical ECHO - n/a Cardiac Cath - n/a  ICD Pacemaker/Loop - n/a  Sleep Study -  Yes CPAP - uses CPAP nightly  Diabetes Type 2, no meds, diet controlled, does not check blood sugar  Aspirin and Blood Thinner Instructions:  n/a  ERAS - Clear liquids til 8:45 AM DOS  Anesthesia review: Yes  STOP now taking any Aspirin (unless otherwise instructed by your surgeon), Aleve, Naproxen, Ibuprofen , Motrin , Mobic, Advil , Goody's, BC's, all herbal medications, fish oil, and all vitamins.   Coronavirus Screening Do you have any of the following symptoms:  Cough Yes Fever (>100.95F)  yes/no: No Runny nose yes/no: No Sore throat yes/no: No Difficulty breathing/shortness of breath  yes/no: No  Have you traveled in the last 14 days and where? yes/no: No  Patient verbalized understanding of instructions that were given via phone.

## 2023-07-24 ENCOUNTER — Ambulatory Visit (HOSPITAL_BASED_OUTPATIENT_CLINIC_OR_DEPARTMENT_OTHER): Payer: 59 | Admitting: Physician Assistant

## 2023-07-24 ENCOUNTER — Ambulatory Visit (HOSPITAL_COMMUNITY)
Admission: RE | Admit: 2023-07-24 | Discharge: 2023-07-24 | Disposition: A | Discharge status: Home / Self Care | Payer: 59 | Source: Ambulatory Visit | Attending: General Surgery | Admitting: General Surgery

## 2023-07-24 ENCOUNTER — Encounter (HOSPITAL_COMMUNITY)
Admission: RE | Disposition: A | Discharge status: Home / Self Care | Payer: Self-pay | Source: Ambulatory Visit | Attending: General Surgery

## 2023-07-24 ENCOUNTER — Encounter (HOSPITAL_COMMUNITY): Payer: Self-pay | Admitting: General Surgery

## 2023-07-24 ENCOUNTER — Ambulatory Visit (HOSPITAL_COMMUNITY): Payer: Self-pay | Admitting: Physician Assistant

## 2023-07-24 DIAGNOSIS — Z794 Long term (current) use of insulin: Secondary | ICD-10-CM | POA: Diagnosis not present

## 2023-07-24 DIAGNOSIS — I10 Essential (primary) hypertension: Secondary | ICD-10-CM

## 2023-07-24 DIAGNOSIS — Z6841 Body Mass Index (BMI) 40.0 and over, adult: Secondary | ICD-10-CM | POA: Diagnosis not present

## 2023-07-24 DIAGNOSIS — E119 Type 2 diabetes mellitus without complications: Secondary | ICD-10-CM | POA: Insufficient documentation

## 2023-07-24 DIAGNOSIS — E66813 Obesity, class 3: Secondary | ICD-10-CM | POA: Diagnosis not present

## 2023-07-24 DIAGNOSIS — K611 Rectal abscess: Secondary | ICD-10-CM | POA: Insufficient documentation

## 2023-07-24 DIAGNOSIS — G4730 Sleep apnea, unspecified: Secondary | ICD-10-CM | POA: Insufficient documentation

## 2023-07-24 HISTORY — PX: INCISION AND DRAINAGE PERIRECTAL ABSCESS: SHX1804

## 2023-07-24 HISTORY — DX: Personal history of urinary calculi: Z87.442

## 2023-07-24 HISTORY — DX: Pneumonia, unspecified organism: J18.9

## 2023-07-24 LAB — POCT I-STAT, CHEM 8
BUN: 11 mg/dL (ref 6–20)
Calcium, Ion: 1.11 mmol/L — ABNORMAL LOW (ref 1.15–1.40)
Chloride: 99 mmol/L (ref 98–111)
Creatinine, Ser: 1 mg/dL (ref 0.61–1.24)
Glucose, Bld: 111 mg/dL — ABNORMAL HIGH (ref 70–99)
HCT: 49 % (ref 39.0–52.0)
Hemoglobin: 16.7 g/dL (ref 13.0–17.0)
Potassium: 4.1 mmol/L (ref 3.5–5.1)
Sodium: 136 mmol/L (ref 135–145)
TCO2: 28 mmol/L (ref 22–32)

## 2023-07-24 LAB — GLUCOSE, CAPILLARY
Glucose-Capillary: 121 mg/dL — ABNORMAL HIGH (ref 70–99)
Glucose-Capillary: 106 mg/dL — ABNORMAL HIGH (ref 70–99)
Glucose-Capillary: 111 mg/dL — ABNORMAL HIGH (ref 70–99)

## 2023-07-24 SURGERY — INCISION AND DRAINAGE, ABSCESS, PERIRECTAL
Anesthesia: General | Site: Buttocks | Laterality: Right

## 2023-07-24 MED ORDER — CHLORHEXIDINE GLUCONATE 0.12 % MT SOLN
15.0000 mL | Freq: Once | OROMUCOSAL | Status: AC
  Administered 2023-07-24: 15 mL via OROMUCOSAL
  Filled 2023-07-24: qty 15

## 2023-07-24 MED ORDER — BUPIVACAINE-EPINEPHRINE (PF) 0.25% -1:200000 IJ SOLN
INTRAMUSCULAR | Status: DC | PRN
  Administered 2023-07-24: 12 mL

## 2023-07-24 MED ORDER — METRONIDAZOLE 500 MG/100ML IV SOLN
500.0000 mg | INTRAVENOUS | Status: AC
  Administered 2023-07-24: 500 mg via INTRAVENOUS
  Filled 2023-07-24: qty 100

## 2023-07-24 MED ORDER — LACTATED RINGERS IV SOLN: INTRAVENOUS | Status: DC

## 2023-07-24 MED ORDER — ONDANSETRON HCL 4 MG/2ML IJ SOLN
INTRAMUSCULAR | Status: DC | PRN
  Administered 2023-07-24: 4 mg via INTRAVENOUS

## 2023-07-24 MED ORDER — MEPERIDINE HCL 25 MG/ML IJ SOLN: 6.2500 mg | INTRAMUSCULAR | Status: DC | PRN

## 2023-07-24 MED ORDER — ONDANSETRON HCL 4 MG/2ML IJ SOLN: 4.0000 mg | Freq: Once | INTRAMUSCULAR | Status: DC | PRN

## 2023-07-24 MED ORDER — OXYCODONE HCL 5 MG/5ML PO SOLN: 5.0000 mg | Freq: Once | ORAL | Status: DC | PRN

## 2023-07-24 MED ORDER — BUPIVACAINE-EPINEPHRINE (PF) 0.25% -1:200000 IJ SOLN
INTRAMUSCULAR | Status: AC
  Filled 2023-07-24: qty 30

## 2023-07-24 MED ORDER — PROPOFOL 10 MG/ML IV BOLUS
INTRAVENOUS | Status: DC | PRN
  Administered 2023-07-24: 200 mg via INTRAVENOUS

## 2023-07-24 MED ORDER — LACTATED RINGERS IV SOLN: INTRAVENOUS | Status: DC | PRN

## 2023-07-24 MED ORDER — AMISULPRIDE (ANTIEMETIC) 5 MG/2ML IV SOLN: 10.0000 mg | Freq: Once | INTRAVENOUS | Status: DC | PRN

## 2023-07-24 MED ORDER — OXYCODONE HCL 5 MG PO TABS: 5.0000 mg | ORAL_TABLET | Freq: Once | ORAL | Status: DC | PRN

## 2023-07-24 MED ORDER — HYDROMORPHONE HCL 1 MG/ML IJ SOLN: 0.2500 mg | INTRAMUSCULAR | Status: DC | PRN

## 2023-07-24 MED ORDER — PROPOFOL 10 MG/ML IV BOLUS
INTRAVENOUS | Status: AC
  Filled 2023-07-24: qty 20

## 2023-07-24 MED ORDER — FENTANYL CITRATE (PF) 250 MCG/5ML IJ SOLN
INTRAMUSCULAR | Status: DC | PRN
  Administered 2023-07-24 (×3): 50 ug via INTRAVENOUS

## 2023-07-24 MED ORDER — TRAMADOL HCL 50 MG PO TABS: 50.0000 mg | ORAL_TABLET | Freq: Four times a day (QID) | ORAL | 0 refills | Status: AC | PRN

## 2023-07-24 MED ORDER — ORAL CARE MOUTH RINSE: 15.0000 mL | Freq: Once | OROMUCOSAL | Status: AC

## 2023-07-24 MED ORDER — ACETAMINOPHEN 500 MG PO TABS
1000.0000 mg | ORAL_TABLET | Freq: Once | ORAL | Status: AC
  Administered 2023-07-24: 1000 mg via ORAL
  Filled 2023-07-24: qty 2

## 2023-07-24 MED ORDER — KETOROLAC TROMETHAMINE 30 MG/ML IJ SOLN: 30.0000 mg | Freq: Once | INTRAMUSCULAR | Status: DC | PRN

## 2023-07-24 MED ORDER — LIDOCAINE HCL 1 % IJ SOLN
INTRAMUSCULAR | Status: AC
  Filled 2023-07-24: qty 20

## 2023-07-24 MED ORDER — LIDOCAINE 2% (20 MG/ML) 5 ML SYRINGE
INTRAMUSCULAR | Status: DC | PRN
  Administered 2023-07-24: 60 mg via INTRAVENOUS

## 2023-07-24 MED ORDER — MIDAZOLAM HCL 2 MG/2ML IJ SOLN
INTRAMUSCULAR | Status: AC
  Filled 2023-07-24: qty 2

## 2023-07-24 MED ORDER — AMOXICILLIN-POT CLAVULANATE 875-125 MG PO TABS: 1.0000 | ORAL_TABLET | Freq: Two times a day (BID) | ORAL | 5 refills | Status: AC

## 2023-07-24 MED ORDER — MIDAZOLAM HCL 2 MG/2ML IJ SOLN
INTRAMUSCULAR | Status: DC | PRN
  Administered 2023-07-24: 2 mg via INTRAVENOUS

## 2023-07-24 MED ORDER — SODIUM CHLORIDE 0.9 % IV SOLN
2.0000 g | INTRAVENOUS | Status: AC
  Administered 2023-07-24: 2 g via INTRAVENOUS
  Filled 2023-07-24: qty 20

## 2023-07-24 MED ORDER — FENTANYL CITRATE (PF) 250 MCG/5ML IJ SOLN
INTRAMUSCULAR | Status: AC
  Filled 2023-07-24: qty 5

## 2023-07-24 MED ORDER — DEXAMETHASONE SODIUM PHOSPHATE 10 MG/ML IJ SOLN
INTRAMUSCULAR | Status: DC | PRN
  Administered 2023-07-24: 5 mg via INTRAVENOUS

## 2023-07-24 SURGICAL SUPPLY — 24 items
BRIEF MESH DISP LRG (UNDERPADS AND DIAPERS) ×2 IMPLANT
CANISTER SUCT 3000ML PPV (MISCELLANEOUS) ×2 IMPLANT
COVER SURGICAL LIGHT HANDLE (MISCELLANEOUS) ×2 IMPLANT
ELECT CAUTERY BLADE 6.4 (BLADE) ×2 IMPLANT
ELECT REM PT RETURN 9FT ADLT (ELECTROSURGICAL) ×1
ELECTRODE REM PT RTRN 9FT ADLT (ELECTROSURGICAL) IMPLANT
GAUZE PACKING IODOFORM 1/4X15 (PACKING) IMPLANT
GAUZE PAD ABD 8X10 STRL (GAUZE/BANDAGES/DRESSINGS) ×2 IMPLANT
GLOVE BIO SURGEON STRL SZ7 (GLOVE) ×2 IMPLANT
GLOVE BIOGEL PI IND STRL 7.5 (GLOVE) ×2 IMPLANT
GOWN STRL REUS W/ TWL LRG LVL3 (GOWN DISPOSABLE) ×4 IMPLANT
KIT BASIN OR (CUSTOM PROCEDURE TRAY) ×2 IMPLANT
KIT TURNOVER KIT B (KITS) ×2 IMPLANT
NDL HYPO 25GX1X1/2 BEV (NEEDLE) ×2 IMPLANT
NEEDLE HYPO 25GX1X1/2 BEV (NEEDLE) ×1 IMPLANT
NS IRRIG 1000ML POUR BTL (IV SOLUTION) ×2 IMPLANT
PACK GENERAL/GYN (CUSTOM PROCEDURE TRAY) IMPLANT
PACK LITHOTOMY IV (CUSTOM PROCEDURE TRAY) ×2 IMPLANT
PAD ARMBOARD 7.5X6 YLW CONV (MISCELLANEOUS) ×2 IMPLANT
SWAB COLLECTION DEVICE MRSA (MISCELLANEOUS) IMPLANT
SWAB CULTURE ESWAB REG 1ML (MISCELLANEOUS) IMPLANT
SYR CONTROL 10ML LL (SYRINGE) ×2 IMPLANT
TOWEL GREEN STERILE FF (TOWEL DISPOSABLE) ×2 IMPLANT
UNDERPAD 30X36 HEAVY ABSORB (UNDERPADS AND DIAPERS) ×2 IMPLANT

## 2023-07-24 NOTE — Interval H&P Note (Signed)
 History and Physical Interval Note:  07/24/2023 11:26 AM  Cody Sullivan  has presented today for surgery, with the diagnosis of abscess.  The various methods of treatment have been discussed with the patient and family. After consideration of risks, benefits and other options for treatment, the patient has consented to  Procedure(s): IRRIGATION AND DEBRIDEMENT PERIRECTAL ABSCESS (N/A) as a surgical intervention.  The patient's history has been reviewed, patient examined, no change in status, stable for surgery.  I have reviewed the patient's chart and labs.  Questions were answered to the patient's satisfaction.     [name]

## 2023-07-24 NOTE — Op Note (Signed)
 Preoperative diagnosis: Perirectal abscess Postoperative diagnosis: Same as above procedure: Incision and drainage of perirectal abscess Surgical Dr. Adina Bury Anesthesia General Estimated blood loss: Minimal Complications: None Drains: None Specimens: Cultures to microbiology Sponge correct completion Disposition recovery stable condition  Indications: This is a 48 year old male diabetic who presents with perianal pain.  He reports a fever.  He has had a perianal abscess abscess drained in 2022.  He was seen in our office yesterday and this was able to be palpated but unable to be drained due to his pain.  He presented today for drainage.  Procedure: After informed consent was obtained he was taken to the operating room.  He was given antibiotics.  SCDs were placed.  He was placed under general anesthesia without complication.  He was prepped and draped in a standard sterile surgical fashion.  A surgical timeout was then performed.  He was then lithotomy.  I was able to identify the area as well as at the attempted drainage yesterday.  I infiltrated Marcaine  throughout this and did a perianal block.  I then reentered that incision as it had already closed up.  I made it a little bit larger.  I then was able to probe into what was a very large cavity.  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  Long-term I think he needs to see colorectal surgery.  I did some cultures.  This was adequately drained.  I irrigated and packed it with quarter inch iodoform.  He tolerated this well was extubated and transferred recovery stable.

## 2023-07-24 NOTE — Anesthesia Postprocedure Evaluation (Signed)
 Anesthesia Post Note  Patient: Cody Sullivan  Procedure(s) Performed: IRRIGATION AND DEBRIDEMENT PERIRECTAL ABSCESS (Right: Buttocks)     Patient location during evaluation: PACU Anesthesia Type: General Level of consciousness: awake and alert, oriented and patient cooperative Pain management: pain level controlled Vital Signs Assessment: post-procedure vital signs reviewed and stable Respiratory status: spontaneous breathing, nonlabored ventilation and respiratory function stable Cardiovascular status: blood pressure returned to baseline and stable Postop Assessment: no apparent nausea or vomiting Anesthetic complications: no   No notable events documented.  Last Vitals:  Vitals:   07/24/23 1600 07/24/23 1615  BP: 108/63 (!) 106/56  Pulse: 69 68  Resp: 17 17  Temp:  36.9 C  SpO2: 92% 95%    Last Pain:  Vitals:   07/24/23 1615  PainSc: 0-No pain                 Cody Sullivan

## 2023-07-24 NOTE — Transfer of Care (Signed)
 Immediate Anesthesia Transfer of Care Note  Patient: Cody Sullivan  Procedure(s) Performed: IRRIGATION AND DEBRIDEMENT PERIRECTAL ABSCESS  Patient Location: PACU  Anesthesia Type:General  Level of Consciousness: drowsy  Airway & Oxygen Therapy: Patient Spontanous Breathing and Patient connected to face mask oxygen  Post-op Assessment: Report given to RN, Post -op Vital signs reviewed and stable, and Patient moving all extremities  Post vital signs: Reviewed and stable  Last Vitals:  Vitals Value Taken Time  BP 88/61 07/24/23 1542  Temp    Pulse 78 07/24/23 1544  Resp 19 07/24/23 1544  SpO2 94 % 07/24/23 1544  Vitals shown include unfiled device data.  Last Pain:  Vitals:   07/24/23 1133  PainSc: 8       Patients Stated Pain Goal: 3 (07/24/23 1133)  Complications: No notable events documented.

## 2023-07-24 NOTE — Discharge Instructions (Signed)
[facility]  RECTAL SURGERY POST OP INSTRUCTIONS: POST OP INSTRUCTIONS  Always review your discharge instruction sheet given to you by the facility where your surgery was performed. IF YOU HAVE DISABILITY OR FAMILY LEAVE FORMS, YOU MUST BRING THEM TO THE OFFICE FOR PROCESSING.   DO NOT GIVE THEM TO YOUR DOCTOR.  A  prescription for pain medication may be given to you upon discharge.  Take your pain medication as prescribed, if needed.  If narcotic pain medicine is not needed, then you may take acetaminophen (Tylenol) or ibuprofen (Advil) as needed. Take your usually prescribed medications unless otherwise directed. If you need a refill on your pain medication, please contact your pharmacy.  They will contact our office to request authorization. Prescriptions will not be filled after 5 pm or on week-ends. You should follow a light diet the first 48 hours after arrival home, such as soup and crackers, etc.  Be sure to include lots of fluids daily.  Resume your normal diet 2-3 days after surgery.. Most patients will experience some swelling and discomfort in the rectal area. Ice packs, reclining and warm tub soaks will help.  Swelling and discomfort can take several days to resolve.  It is common to experience some constipation if taking pain medication after surgery.  Increasing fluid intake and taking a stool softener (such as Colace) will usually help or prevent this problem from occurring.  A mild laxative (Milk of Magnesia or Miralax) should be taken according to package directions if there are no bowel movements after 48 hours. Unless discharge instructions indicate otherwise, leave your bandage dry and in place for 24 hours, or remove the bandage if you have a bowel movement. You may notice a small amount of bleeding with bowel movements for the first few days. You may have some packing in the rectum which will come out over the first day or two. You will need to wear an  absorbent pad or soft cotton gauze in your underwear until the drainage stops.it. ACTIVITIES:  You may resume regular (light) daily activities beginning the next day--such as daily self-care, walking, climbing stairs--gradually increasing activities as tolerated.  You may have sexual intercourse when it is comfortable.  Refrain from any heavy lifting or straining until approved by your doctor. You may drive when you are no longer taking prescription pain medication, you can comfortably wear a seatbelt, and you can safely maneuver your car and apply brakes. RETURN TO WORK: : ____________________  You should see your doctor in the office for a follow-up appointment approximately 2-3 weeks after your surgery.  Make sure that you call for this appointment within a day or two after you arrive home to insure a convenient appointment time. OTHER INSTRUCTIONS:  __________________________________________________________________________________________________________________________________________________________________________________________  WHEN TO CALL YOUR DOCTOR: Fever over 101.0 Inability to urinate Nausea and/or vomiting Extreme swelling or bruising Continued bleeding from rectum. Increased pain, redness, or drainage from the incision Constipation  The clinic staff is available to answer your questions during regular business hours.  Please don't hesitate to call and ask to speak to one of the nurses for clinical concerns.  If you have a medical emergency, go to the nearest emergency room or call 911.  A surgeon from [facility] is always on call at the hospital   [address], [address], [place], [place]  [address] ?  [address] [address], [place], [place]   [address] [phone] ? [phone] ? FAX [phone] Web site:   [email or web address] ° °

## 2023-07-24 NOTE — Anesthesia Preprocedure Evaluation (Addendum)
Anesthesia Evaluation  Patient identified by MRN, date of birth, ID band Patient awake    Reviewed: Allergy & Precautions, H&P , NPO status , Patient's Chart, lab work & pertinent test results  Airway Mallampati: II  TM Distance: >3 FB Neck ROM: Full    Dental  (+) Teeth Intact, Dental Advisory Given   Pulmonary sleep apnea and Continuous Positive Airway Pressure Ventilation    Pulmonary exam normal breath sounds clear to auscultation       Cardiovascular hypertension (115/64), Pt. on medications Normal cardiovascular exam Rhythm:Regular Rate:Normal     Neuro/Psych negative neurological ROS  negative psych ROS   GI/Hepatic negative GI ROS, Neg liver ROS,,,  Endo/Other  diabetes, Well Controlled, Type 2  Class 3 obesityBMI 40  Renal/GU negative Renal ROS  negative genitourinary   Musculoskeletal negative musculoskeletal ROS (+)    Abdominal  (+) + obese  Peds negative pediatric ROS (+)  Hematology negative hematology ROS (+)   Anesthesia Other Findings   Reproductive/Obstetrics negative OB ROS                             Anesthesia Physical Anesthesia Plan  ASA: 3  Anesthesia Plan: General   Post-op Pain Management: Tylenol PO (pre-op)*   Induction: Intravenous  PONV Risk Score and Plan: 2 and Ondansetron, Dexamethasone, Midazolam and Treatment may vary due to age or medical condition  Airway Management Planned: LMA  Additional Equipment: None  Intra-op Plan:   Post-operative Plan: Extubation in OR  Informed Consent: I have reviewed the patients History and Physical, chart, labs and discussed the procedure including the risks, benefits and alternatives for the proposed anesthesia with the patient or authorized representative who has indicated his/her understanding and acceptance.     Dental advisory given  Plan Discussed with: CRNA  Anesthesia Plan Comments:          Anesthesia Quick Evaluation

## 2023-07-25 ENCOUNTER — Encounter (HOSPITAL_COMMUNITY): Payer: Self-pay | Admitting: General Surgery

## 2023-07-30 LAB — AEROBIC/ANAEROBIC CULTURE W GRAM STAIN (SURGICAL/DEEP WOUND): Gram Stain: NONE SEEN

## 2023-08-09 ENCOUNTER — Telehealth: Payer: Self-pay

## 2023-08-09 DIAGNOSIS — E291 Testicular hypofunction: Secondary | ICD-10-CM

## 2023-08-09 MED ORDER — TESTOSTERONE CYPIONATE 200 MG/ML IM SOLN: 100.0000 mg | INTRAMUSCULAR | 5 refills | Status: AC

## 2023-08-09 NOTE — Telephone Encounter (Signed)
 Patient needs a refill on Testosterone. Send to [facility].

## 2023-08-19 ENCOUNTER — Other Ambulatory Visit: Payer: Self-pay

## 2023-08-19 DIAGNOSIS — E1142 Type 2 diabetes mellitus with diabetic polyneuropathy: Secondary | ICD-10-CM

## 2023-08-19 DIAGNOSIS — E785 Hyperlipidemia, unspecified: Secondary | ICD-10-CM

## 2023-08-19 DIAGNOSIS — E230 Hypopituitarism: Secondary | ICD-10-CM

## 2023-08-26 ENCOUNTER — Other Ambulatory Visit: Payer: 59

## 2023-08-27 LAB — PSA: PSA: 0.96 ng/mL (ref <=4.00)

## 2023-08-29 ENCOUNTER — Encounter: Payer: Self-pay | Admitting: Internal Medicine

## 2023-08-29 ENCOUNTER — Ambulatory Visit: Payer: 59 | Admitting: Internal Medicine

## 2023-08-29 VITALS — BP 124/82 | HR 88 | Ht 70.0 in | Wt 274.0 lb

## 2023-08-29 DIAGNOSIS — E785 Hyperlipidemia, unspecified: Secondary | ICD-10-CM | POA: Diagnosis not present

## 2023-08-29 DIAGNOSIS — E1142 Type 2 diabetes mellitus with diabetic polyneuropathy: Secondary | ICD-10-CM

## 2023-08-29 DIAGNOSIS — E230 Hypopituitarism: Secondary | ICD-10-CM

## 2023-08-29 LAB — POCT GLUCOSE (DEVICE FOR HOME USE): POC Glucose: 149 mg/dL — AB (ref 70–99)

## 2023-08-29 LAB — POCT GLYCOSYLATED HEMOGLOBIN (HGB A1C): Hemoglobin A1C: 6.5 % — AB (ref 4.0–5.6)

## 2023-08-29 MED ORDER — ATORVASTATIN CALCIUM 20 MG PO TABS: 20.0000 mg | ORAL_TABLET | Freq: Every day | ORAL | 3 refills | Status: DC

## 2023-08-29 NOTE — Progress Notes (Signed)
 Name: Cody Sullivan  Age/ Sex: 48 y.o., male   MRN/ DOB: [id number], [birth year]     PCP: Soundra Pilon, FNP   Reason for Endocrinology Evaluation: Type 2 Diabetes Mellitus  Initial Endocrine Consultative Visit: 04/10/18    PATIENT IDENTIFIER: Cody Sullivan is a 48 y.o. male with a past medical history of OSA on CPAP, dyslipidemia and obesity . The patient has followed with Endocrinology clinic since 04/10/18 for consultative assistance with management of his diabetes.  DIABETIC HISTORY:  Cody Sullivan was diagnosed with T2DM in 2019, her was offered Metformin at the time of his diagnosis by his PCP., but the patient opted for diet control and lifestyle changes. His hemoglobin A1c has ranged from 6.3%  in 10/2017, peaking at 6.5% in 03/2018 .   Low Testosterone:   He was found to have a low testosterone in 03/2018 ( T. Testosterone 126 ng/dL) with inappropriately normal LH/FSH. 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   He is S/P Right frontal craniotomy on 08/04/2018 through Dr. Gwendlyn Deutscher   He has been tired since 2017, he has noted somewhat decrease in libido, no difficulty with erections. He has noted gynecomastia for a while, no pain or discharge from the nipples. He also noted a subjective decrease in testicular size ~ 01/2018. He has 2 biological kids. He has not noticed any decrease in frequency of shaving.  He has chronic occasional headaches.     No known FH  of prostate cancer.   Testosterone started 01/2019  SUBJECTIVE:   Today (08/29/2023): Cody Sullivan is here for afollow up on his diabetes management and hypogonadism. He checks his blood sugars 0  time a week. The patient has not had hypoglycemic episodes since the last clinic visit.   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  Patient does not know if this was related to a test that they did in the office or just looking through his history  Denies recent fever  Denies nausea or vomiting  Denies constipation or diarrhea  Continues with chronic mild  headaches    HOME ENDOCRINE REGIMEN:  Testosterone Cypionate 200mg /mL , 100 mg  (0.65mL ) Q14  Atorvastatin 20 mg daily      METER DOWNLOAD SUMMARY: did not bring      DIABETIC COMPLICATIONS: Microvascular complications:   Denies: Neuropathy, CKD or retinopathy  Last Eye Exam: Completed 2023   Macrovascular complications:   Denies: CAD, CVA, PVD   HISTORY:  Past Medical History:  Past Medical History:  Diagnosis Date   Allergy    Diabetes mellitus without complication (HCC)    type 2, diet controlled, no meds   History of kidney stones    x several - some surgical removed - passed some stones   Hypertension    Pneumonia    "years ago" x 1   Sleep apnea    uses cpap nightly   Past Surgical History:  Past Surgical History:  Procedure Laterality Date   ANKLE SURGERY  2003  right    cracked chest  2007   equipment fell on chest and cracked his chest, had pain related to injury, cardiologist confirmed not related to heart.     CRANIOTOMY Right 08/04/2018   Right frontal with shunt- cyst removal   INCISION AND DRAINAGE PERIRECTAL ABSCESS N/A 07/18/2020   Procedure: IRRIGATION AND DEBRIDEMENT PERIRECTAL ABSCESS;  Surgeon: Quentin Ore, MD;  Location: [facility];  Service: General;  Laterality: N/A;   INCISION AND DRAINAGE PERIRECTAL ABSCESS Right 07/24/2023   Procedure: IRRIGATION AND DEBRIDEMENT PERIRECTAL ABSCESS;  Surgeon: Emelia Loron, MD;  Location: [facility] OR;   Service: General;  Laterality: Right;   LITHOTRIPSY  2010   MRI     x several in [place] - last one 02/27/22   nose fracture  2000   WISDOM TOOTH EXTRACTION     Social History:  reports that he has never smoked. He has never used smokeless tobacco. He reports that he does not drink alcohol and does not use drugs. Family History: No family history of prostate ca.    HOME MEDICATIONS: Allergies as of 08/29/2023   No Known Allergies      Medication List        Accurate as of August 29, 2023  6:52 AM. If you have any questions, ask your nurse or doctor.          amoxicillin-clavulanate 875-125 MG tablet Commonly known as: AUGMENTIN Take 1 tablet by mouth 2 (two) times daily.   atorvastatin 20 MG tablet Commonly known as: LIPITOR Take 1 tablet (20 mg total) by mouth daily. What changed: when to take this   b complex vitamins capsule Take 1 capsule by mouth at bedtime.   colchicine 0.6 MG tablet Take by mouth.   Contour Next Test test strip Generic drug: glucose blood Use as instructed to check blood sugar once daily   Dulera 200-5 MCG/ACT Aero Generic drug: mometasone-formoterol Inhale 2 puffs into the lungs 2 (two) times daily.   fluticasone-salmeterol 250-50 MCG/ACT Aepb Commonly known as: Advair Diskus Inhale 1 puff into the lungs 2 (two) times daily.   HYDROcodone-acetaminophen 5-325 MG tablet Commonly known as: NORCO/VICODIN Take 1 tablet by mouth every 6 (six) hours as needed for moderate pain (pain score 4-6).   lisinopril-hydrochlorothiazide 20-25 MG tablet Commonly known as: ZESTORETIC Take 1 tablet by mouth daily. What changed: when to take this   meloxicam 15 MG tablet Commonly known as: MOBIC Take 15 mg by mouth daily as needed for pain.   methocarbamol 750 MG tablet Commonly known as: ROBAXIN Take 750 mg by mouth daily as needed for muscle spasms.   Microlet Lancets Misc Use to check blood sugars once a day with Contour Next glucometer    NEEDLE (DISP) 22 G 22G X 1" Misc 1 Device by Does not apply route every 14 (fourteen) days.   predniSONE 10 MG tablet Commonly known as: DELTASONE Take by mouth.   SYRINGE-NEEDLE (DISP) 3 ML 25G X 5/8" 3 ML Misc Use as directed every 14 days. (1 Device by Does not apply route every 14 (fourteen) days.)   testosterone cypionate 200 MG/ML injection Commonly known as: DEPOTESTOSTERONE CYPIONATE Inject 0.5 mLs (100 mg total) into the muscle every 14 (fourteen) days.   traMADol 50 MG tablet Commonly known as: ULTRAM Take 1 tablet (50 mg total) by mouth every 6 (six) hours as needed.   Vitamin D3 25 MCG (1000 UT) Caps Take 1,000 Units by mouth at bedtime.  OBJECTIVE:   Vital Signs: There were no vitals taken for this visit.  Wt Readings from Last 3 Encounters:  07/24/23 277 lb (125.6 kg)  03/01/23 278 lb (126.1 kg)  08/28/22 280 lb (127 kg)     Exam: General: Pt appears well and is in NAD  Lungs: Clear with good BS bilat with no rales, rhonchi, or wheezes  Heart: RRR   Extremities:  Trace edema around ankles  Neuro: MS is good with appropriate affect, pt is alert and Ox3    DM foot exam: 08/29/2023 The skin of the feet is intact without sores or ulcerations.  The pedal pulses are 2+ on right and 2+ on left. The sensation is decreased to a screening 5.07, 10 gram monofilament bilaterally at the toes.      DATA REVIEWED:   Lab Results  Component Value Date   HGBA1C 6.7 (H) 02/25/2023   HGBA1C 6.3 (A) 08/28/2022   HGBA1C 5.7 (A) 02/22/2022     Latest Reference Range & Units 07/24/23 11:55  Sodium 135 - 145 mmol/L 136  Potassium 3.5 - 5.1 mmol/L 4.1  Chloride 98 - 111 mmol/L 99  Glucose 70 - 99 mg/dL 161 (H)  BUN 6 - 20 mg/dL 11  Creatinine 0.96 - 0.45 mg/dL 4.09  Calcium Ionized 8.11 - 1.40 mmol/L 1.11 (L)    Latest Reference Range & Units 07/24/23 11:55  Hemoglobin 13.0 - 17.0 g/dL 91.4  HCT 78.2 - 95.6 % 49.0    Latest Reference Range &  Units 08/26/23 07:55  PSA < OR = 4.00 ng/mL 0.96    Latest Reference Range & Units 02/25/23 08:12  Testosterone 300.00 - 890.00 ng/dL 213.08  TSH 6.57 - 8.46 uIU/mL 3.04  T4,Free(Direct) 0.60 - 1.60 ng/dL 9.62      MRI 02/21/2840  08/23/2020 8:20 AM EST   IMPRESSION:   1. No significant change since 08/25/2019.  2. 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  3. Normal MRI of the sella/pituitary gland without and with contrast. ASSESSMENT / PLAN / RECOMMENDATIONS:   1)  Type 2 Diabetes Mellitus, in remission with neuropathy - Most recent A1c of 6.5 %. Goal A1c < 7.0 %.    -A1c continues at goal  -He will continue with diet and exercise    EDUCATION / INSTRUCTIONS: BG monitoring instructions: Patient is instructed to check his blood sugars 2 times a week, fasting .     2) Pituitary Microadenoma:   - MRI 08/2020 shows stable right middle arachnoid cyst, no evidence of pituitary microadenoma on this MRI  - Clinically  there's no evidence of hyper or hyposecretion except for low testosterone  3) Hypogonadotrophic hypogonadism:    -Testosterone has been within normal range in the past -PSA normal  Medications  Continue testosterone cypionate 200 mg/mL, take 100 mg  ( 0.5 mL) q. 14 days   4) Dyslipidemia :  -LDL at goal   Medication Continue atorvastatin 20 mg QHS    F/U in 6 months    Signed electronically by: Lyndle Herrlich, MD  [facility]  [facility] [address] [place], [place] [address] Phone: [phone] FAX: [phone]   CC: Soundra Pilon, FNP [address] [place] [address] Phone: [phone]  Fax: [phone]  Return to Endocrinology clinic as below: Future Appointments  Date Time Provider Department Center  08/29/2023  7:50 AM Adwoa Axe, Konrad Dolores, MD [facility] None

## 2023-09-04 ENCOUNTER — Other Ambulatory Visit (HOSPITAL_BASED_OUTPATIENT_CLINIC_OR_DEPARTMENT_OTHER): Payer: Self-pay

## 2023-09-04 MED ORDER — TESTOSTERONE CYPIONATE 200 MG/ML IM SOLN
100.0000 mg | INTRAMUSCULAR | 5 refills | Status: AC
  Filled 2023-09-04 – 2023-09-06 (×2): qty 2, 28d supply, fill #0
  Filled 2023-10-04: qty 2, 28d supply, fill #1
  Filled 2023-10-31: qty 2, 28d supply, fill #2
  Filled 2023-11-28: qty 2, 28d supply, fill #3
  Filled 2023-12-24: qty 2, 28d supply, fill #4
  Filled ????-??-??: fill #1

## 2023-09-06 ENCOUNTER — Other Ambulatory Visit: Payer: Self-pay

## 2023-09-06 ENCOUNTER — Other Ambulatory Visit (HOSPITAL_BASED_OUTPATIENT_CLINIC_OR_DEPARTMENT_OTHER): Payer: Self-pay

## 2023-09-24 ENCOUNTER — Other Ambulatory Visit: Payer: Self-pay | Admitting: General Surgery

## 2023-09-24 DIAGNOSIS — K610 Anal abscess: Secondary | ICD-10-CM

## 2023-09-24 DIAGNOSIS — K603 Anal fistula, unspecified: Secondary | ICD-10-CM

## 2023-09-27 ENCOUNTER — Other Ambulatory Visit (HOSPITAL_BASED_OUTPATIENT_CLINIC_OR_DEPARTMENT_OTHER): Payer: Self-pay

## 2023-10-03 ENCOUNTER — Other Ambulatory Visit (HOSPITAL_BASED_OUTPATIENT_CLINIC_OR_DEPARTMENT_OTHER): Payer: Self-pay

## 2023-10-04 ENCOUNTER — Other Ambulatory Visit (HOSPITAL_BASED_OUTPATIENT_CLINIC_OR_DEPARTMENT_OTHER): Payer: Self-pay

## 2023-10-04 ENCOUNTER — Other Ambulatory Visit: Payer: Self-pay

## 2023-10-07 DIAGNOSIS — Z0000 Encounter for general adult medical examination without abnormal findings: Secondary | ICD-10-CM | POA: Diagnosis not present

## 2023-10-07 DIAGNOSIS — E1169 Type 2 diabetes mellitus with other specified complication: Secondary | ICD-10-CM | POA: Diagnosis not present

## 2023-10-07 DIAGNOSIS — M62830 Muscle spasm of back: Secondary | ICD-10-CM | POA: Diagnosis not present

## 2023-10-07 DIAGNOSIS — I10 Essential (primary) hypertension: Secondary | ICD-10-CM | POA: Diagnosis not present

## 2023-10-29 ENCOUNTER — Other Ambulatory Visit (HOSPITAL_BASED_OUTPATIENT_CLINIC_OR_DEPARTMENT_OTHER): Payer: Self-pay

## 2023-10-31 ENCOUNTER — Other Ambulatory Visit (HOSPITAL_COMMUNITY): Payer: Self-pay

## 2023-10-31 ENCOUNTER — Encounter: Payer: Self-pay | Admitting: General Surgery

## 2023-10-31 ENCOUNTER — Other Ambulatory Visit: Payer: Self-pay

## 2023-10-31 ENCOUNTER — Telehealth: Payer: Self-pay

## 2023-10-31 NOTE — Telephone Encounter (Signed)
 Pharmacy Patient Advocate Encounter   Received notification from CoverMyMeds that prior authorization for Testosterone  Cypionate 200MG /ML intramuscular solution is required/requested.   Insurance verification completed.   The patient is insured through [facility] Medicare Part D.   Per test claim: This medication or product is on your plan's list of covered drugs. Prior authorization is not required at this time.

## 2023-11-01 ENCOUNTER — Other Ambulatory Visit (HOSPITAL_BASED_OUTPATIENT_CLINIC_OR_DEPARTMENT_OTHER): Payer: Self-pay

## 2023-11-10 ENCOUNTER — Other Ambulatory Visit

## 2023-11-14 ENCOUNTER — Other Ambulatory Visit

## 2023-11-15 ENCOUNTER — Other Ambulatory Visit

## 2023-11-19 DIAGNOSIS — M461 Sacroiliitis, not elsewhere classified: Secondary | ICD-10-CM | POA: Diagnosis not present

## 2023-11-19 DIAGNOSIS — M51369 Other intervertebral disc degeneration, lumbar region without mention of lumbar back pain or lower extremity pain: Secondary | ICD-10-CM | POA: Diagnosis not present

## 2023-11-25 ENCOUNTER — Other Ambulatory Visit (HOSPITAL_BASED_OUTPATIENT_CLINIC_OR_DEPARTMENT_OTHER): Payer: Self-pay

## 2023-11-28 ENCOUNTER — Other Ambulatory Visit: Payer: Self-pay

## 2023-12-02 DIAGNOSIS — G894 Chronic pain syndrome: Secondary | ICD-10-CM | POA: Diagnosis not present

## 2023-12-02 DIAGNOSIS — M4726 Other spondylosis with radiculopathy, lumbar region: Secondary | ICD-10-CM | POA: Diagnosis not present

## 2023-12-02 DIAGNOSIS — M461 Sacroiliitis, not elsewhere classified: Secondary | ICD-10-CM | POA: Diagnosis not present

## 2023-12-02 DIAGNOSIS — M51362 Other intervertebral disc degeneration, lumbar region with discogenic back pain and lower extremity pain: Secondary | ICD-10-CM | POA: Diagnosis not present

## 2023-12-02 DIAGNOSIS — M48061 Spinal stenosis, lumbar region without neurogenic claudication: Secondary | ICD-10-CM | POA: Diagnosis not present

## 2023-12-02 DIAGNOSIS — M47816 Spondylosis without myelopathy or radiculopathy, lumbar region: Secondary | ICD-10-CM | POA: Diagnosis not present

## 2023-12-12 DIAGNOSIS — M47817 Spondylosis without myelopathy or radiculopathy, lumbosacral region: Secondary | ICD-10-CM | POA: Diagnosis not present

## 2023-12-12 DIAGNOSIS — M461 Sacroiliitis, not elsewhere classified: Secondary | ICD-10-CM | POA: Diagnosis not present

## 2023-12-12 DIAGNOSIS — M4317 Spondylolisthesis, lumbosacral region: Secondary | ICD-10-CM | POA: Diagnosis not present

## 2023-12-12 DIAGNOSIS — M51369 Other intervertebral disc degeneration, lumbar region without mention of lumbar back pain or lower extremity pain: Secondary | ICD-10-CM | POA: Diagnosis not present

## 2023-12-12 DIAGNOSIS — M5186 Other intervertebral disc disorders, lumbar region: Secondary | ICD-10-CM | POA: Diagnosis not present

## 2023-12-12 DIAGNOSIS — M4807 Spinal stenosis, lumbosacral region: Secondary | ICD-10-CM | POA: Diagnosis not present

## 2023-12-23 ENCOUNTER — Other Ambulatory Visit (HOSPITAL_BASED_OUTPATIENT_CLINIC_OR_DEPARTMENT_OTHER): Payer: Self-pay

## 2023-12-24 ENCOUNTER — Other Ambulatory Visit: Payer: Self-pay

## 2023-12-26 ENCOUNTER — Other Ambulatory Visit (HOSPITAL_BASED_OUTPATIENT_CLINIC_OR_DEPARTMENT_OTHER): Payer: Self-pay

## 2023-12-26 ENCOUNTER — Other Ambulatory Visit: Payer: Self-pay

## 2023-12-26 MED ORDER — LISINOPRIL-HYDROCHLOROTHIAZIDE 20-12.5 MG PO TABS
1.0000 | ORAL_TABLET | Freq: Every day | ORAL | 4 refills | Status: AC
  Filled 2023-12-26: qty 90, 90d supply, fill #0
  Filled 2024-03-24: qty 90, 90d supply, fill #1
  Filled 2024-06-23: qty 90, 90d supply, fill #2

## 2023-12-26 MED ORDER — ATORVASTATIN CALCIUM 20 MG PO TABS
20.0000 mg | ORAL_TABLET | Freq: Every day | ORAL | 3 refills | Status: DC
  Filled 2023-12-26: qty 90, 90d supply, fill #0

## 2024-01-02 ENCOUNTER — Other Ambulatory Visit (HOSPITAL_BASED_OUTPATIENT_CLINIC_OR_DEPARTMENT_OTHER): Payer: Self-pay

## 2024-01-02 MED ORDER — TADALAFIL 5 MG PO TABS
5.0000 mg | ORAL_TABLET | Freq: Every day | ORAL | 99 refills | Status: AC | PRN
  Filled 2024-01-02: qty 30, 7d supply, fill #0
  Filled 2024-05-26: qty 30, 7d supply, fill #1
  Filled 2024-07-14: qty 30, 7d supply, fill #2

## 2024-01-14 DIAGNOSIS — G4733 Obstructive sleep apnea (adult) (pediatric): Secondary | ICD-10-CM | POA: Diagnosis not present

## 2024-01-16 DIAGNOSIS — M5136 Other intervertebral disc degeneration, lumbar region with discogenic back pain only: Secondary | ICD-10-CM | POA: Diagnosis not present

## 2024-01-16 DIAGNOSIS — G894 Chronic pain syndrome: Secondary | ICD-10-CM | POA: Diagnosis not present

## 2024-01-16 DIAGNOSIS — M47816 Spondylosis without myelopathy or radiculopathy, lumbar region: Secondary | ICD-10-CM | POA: Diagnosis not present

## 2024-01-16 DIAGNOSIS — M48061 Spinal stenosis, lumbar region without neurogenic claudication: Secondary | ICD-10-CM | POA: Diagnosis not present

## 2024-01-22 ENCOUNTER — Other Ambulatory Visit (HOSPITAL_BASED_OUTPATIENT_CLINIC_OR_DEPARTMENT_OTHER): Payer: Self-pay

## 2024-01-22 ENCOUNTER — Other Ambulatory Visit: Payer: Self-pay | Admitting: Internal Medicine

## 2024-01-22 MED FILL — Testosterone Cypionate IM Inj in Oil 200 MG/ML: INTRAMUSCULAR | 28 days supply | Qty: 2 | Fill #0 | Status: AC

## 2024-01-28 ENCOUNTER — Other Ambulatory Visit (HOSPITAL_BASED_OUTPATIENT_CLINIC_OR_DEPARTMENT_OTHER): Payer: Self-pay

## 2024-01-28 MED ORDER — PEG 3350-KCL-NA BICARB-NACL 420 G PO SOLR
4000.0000 mL | Freq: Once | ORAL | 0 refills | Status: AC
  Filled 2024-01-28: qty 4000, 1d supply, fill #0

## 2024-01-28 MED ORDER — BISACODYL 5 MG PO TBEC
5.0000 mg | DELAYED_RELEASE_TABLET | ORAL | 0 refills | Status: AC
  Filled 2024-01-28: qty 4, 1d supply, fill #0

## 2024-02-07 DIAGNOSIS — K5730 Diverticulosis of large intestine without perforation or abscess without bleeding: Secondary | ICD-10-CM | POA: Diagnosis not present

## 2024-02-07 DIAGNOSIS — Z860101 Personal history of adenomatous and serrated colon polyps: Secondary | ICD-10-CM | POA: Diagnosis not present

## 2024-02-07 DIAGNOSIS — Z09 Encounter for follow-up examination after completed treatment for conditions other than malignant neoplasm: Secondary | ICD-10-CM | POA: Diagnosis not present

## 2024-02-19 ENCOUNTER — Other Ambulatory Visit (HOSPITAL_BASED_OUTPATIENT_CLINIC_OR_DEPARTMENT_OTHER): Payer: Self-pay

## 2024-02-20 ENCOUNTER — Other Ambulatory Visit: Payer: Self-pay

## 2024-02-20 MED FILL — Testosterone Cypionate IM Inj in Oil 200 MG/ML: INTRAMUSCULAR | 28 days supply | Qty: 2 | Fill #1 | Status: AC

## 2024-02-27 ENCOUNTER — Other Ambulatory Visit

## 2024-02-27 DIAGNOSIS — E785 Hyperlipidemia, unspecified: Secondary | ICD-10-CM | POA: Diagnosis not present

## 2024-02-27 DIAGNOSIS — E230 Hypopituitarism: Secondary | ICD-10-CM | POA: Diagnosis not present

## 2024-02-27 DIAGNOSIS — E1142 Type 2 diabetes mellitus with diabetic polyneuropathy: Secondary | ICD-10-CM | POA: Diagnosis not present

## 2024-03-02 DIAGNOSIS — M51362 Other intervertebral disc degeneration, lumbar region with discogenic back pain and lower extremity pain: Secondary | ICD-10-CM | POA: Diagnosis not present

## 2024-03-03 LAB — BASIC METABOLIC PANEL WITH GFR
BUN: 10 mg/dL (ref 7–25)
CO2: 27 mmol/L (ref 20–32)
Calcium: 9.8 mg/dL (ref 8.6–10.3)
Chloride: 101 mmol/L (ref 98–110)
Creat: 0.91 mg/dL (ref 0.60–1.29)
Glucose, Bld: 117 mg/dL — ABNORMAL HIGH (ref 65–99)
Potassium: 4.3 mmol/L (ref 3.5–5.3)
Sodium: 140 mmol/L (ref 135–146)
eGFR: 103 mL/min/1.73m2 (ref >=60)

## 2024-03-03 LAB — CBC
HCT: 50.5 % — ABNORMAL HIGH (ref 38.5–50.0)
Hemoglobin: 16.9 g/dL (ref 13.2–17.1)
MCH: 29.9 pg (ref 27.0–33.0)
MCHC: 33.5 g/dL (ref 32.0–36.0)
MCV: 89.2 fL (ref 80.0–100.0)
MPV: 10.3 fL (ref 7.5–12.5)
Platelets: 260 Thousand/uL (ref 140–400)
RBC: 5.66 Million/uL (ref 4.20–5.80)
RDW: 13.5 % (ref 11.0–15.0)
WBC: 9 Thousand/uL (ref 3.8–10.8)

## 2024-03-03 LAB — MICROALBUMIN / CREATININE URINE RATIO
Creatinine, Urine: 208 mg/dL (ref 20–320)
Microalb Creat Ratio: 4 mg/g{creat} (ref <30)
Microalb, Ur: 0.9 mg/dL

## 2024-03-03 LAB — LIPID PANEL
Cholesterol: 152 mg/dL (ref <200)
HDL: 36 mg/dL — ABNORMAL LOW (ref >=40)
LDL Cholesterol (Calc): 92 mg/dL
Non-HDL Cholesterol (Calc): 116 mg/dL (ref <130)
Total CHOL/HDL Ratio: 4.2 (calc) (ref <5.0)
Triglycerides: 144 mg/dL (ref <150)

## 2024-03-03 LAB — TESTOSTERONE, TOTAL, LC/MS/MS: Testosterone, Total, LC-MS-MS: 671 ng/dL (ref 250–1100)

## 2024-03-04 ENCOUNTER — Ambulatory Visit: Admitting: Internal Medicine

## 2024-03-04 ENCOUNTER — Other Ambulatory Visit (HOSPITAL_BASED_OUTPATIENT_CLINIC_OR_DEPARTMENT_OTHER): Payer: Self-pay

## 2024-03-04 ENCOUNTER — Encounter: Payer: Self-pay | Admitting: Internal Medicine

## 2024-03-04 VITALS — BP 120/80 | HR 64 | Ht 70.0 in | Wt 269.0 lb

## 2024-03-04 DIAGNOSIS — E230 Hypopituitarism: Secondary | ICD-10-CM | POA: Diagnosis not present

## 2024-03-04 DIAGNOSIS — E1142 Type 2 diabetes mellitus with diabetic polyneuropathy: Secondary | ICD-10-CM | POA: Diagnosis not present

## 2024-03-04 DIAGNOSIS — E785 Hyperlipidemia, unspecified: Secondary | ICD-10-CM | POA: Diagnosis not present

## 2024-03-04 LAB — POCT GLYCOSYLATED HEMOGLOBIN (HGB A1C): Hemoglobin A1C: 6.3 % — AB (ref 4.0–5.6)

## 2024-03-04 LAB — POCT GLUCOSE (DEVICE FOR HOME USE): Glucose Fasting, POC: 137 mg/dL — AB (ref 70–99)

## 2024-03-04 MED ORDER — ATORVASTATIN CALCIUM 20 MG PO TABS
20.0000 mg | ORAL_TABLET | Freq: Every day | ORAL | 3 refills | Status: AC
  Filled 2024-03-04 – 2024-03-24 (×2): qty 90, 90d supply, fill #0
  Filled 2024-06-23: qty 90, 90d supply, fill #1

## 2024-03-04 NOTE — Progress Notes (Signed)
 Name: Cody Sullivan  Age/ Sex: 48 y.o., male   MRN/ DOB: [id number], 11-30-1975     PCP: Marvene Prentice SAUNDERS, FNP   Reason for Endocrinology Evaluation: Type 2 Diabetes Mellitus  Initial Endocrine Consultative Visit: 04/10/18    PATIENT IDENTIFIER: Cody Sullivan is a 48 y.o. male with a past medical history of OSA on CPAP, dyslipidemia and obesity . The patient has followed with Endocrinology clinic since 04/10/18 for consultative assistance with management of his diabetes.  DIABETIC HISTORY:  Cody Sullivan was diagnosed with T2DM in 2019, her was offered Metformin at the time of his diagnosis by his PCP., but the patient opted for diet control and lifestyle changes. His hemoglobin A1c has ranged from 6.3%  in 10/2017, peaking at 6.5% in 03/2018 .   Low Testosterone :   He was found to have a low testosterone  in 03/2018 ( T. Testosterone  126 ng/dL) with inappropriately normal LH/FSH. 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   He is S/P Right frontal craniotomy on 08/04/2018 through Dr. Elsie Daring   He has been tired since 2017, he has noted somewhat decrease in libido, no difficulty with erections. He has noted gynecomastia for a while, no pain or discharge from the nipples. He also noted a subjective decrease in testicular size ~ 01/2018. He has 2 biological kids. He has not noticed any decrease in frequency of shaving.  He has chronic occasional headaches.     No known FH  of prostate cancer.   Testosterone  started 01/2019  SUBJECTIVE:   Today (03/04/2024): Cody Sullivan is here for afollow up on his diabetes management and hypogonadism. He checks his blood sugars 0  time a week. The patient has not had hypoglycemic episodes since the last clinic visit.   He continues to follow-up with the sleep center for OSA, on CPAP  Goes to the Gym ~ 4 days a week  No nausea or vomiting  No  constipation or diarrhea  Stable chronic mild  headaches  Continues to follow up with neurosurgery for back pain   HOME ENDOCRINE REGIMEN:  Testosterone  Cypionate 200mg /mL , 100 mg  (0.34mL ) Q14  Atorvastatin  20 mg daily    METER DOWNLOAD SUMMARY: did not bring     DIABETIC COMPLICATIONS: Microvascular complications:   Denies: Neuropathy, CKD or retinopathy  Last Eye Exam: Completed 01/2024   Macrovascular complications:   Denies: CAD, CVA, PVD   HISTORY:  Past Medical History:  Past Medical History:  Diagnosis Date   Allergy    Diabetes mellitus without complication (HCC)    type 2, diet controlled, no meds   History of kidney stones    x several - some surgical removed - passed some stones   Hypertension    Pneumonia    years ago x 1   Sleep apnea    uses cpap nightly   Past Surgical History:  Past Surgical History:  Procedure Laterality Date   ANKLE SURGERY  2003   right    cracked chest  2007   equipment fell on chest and cracked his chest, had pain related to injury, cardiologist confirmed not related to heart.     CRANIOTOMY Right 08/04/2018   Right frontal with shunt- cyst removal   INCISION AND DRAINAGE PERIRECTAL ABSCESS N/A 07/18/2020   Procedure: IRRIGATION AND DEBRIDEMENT PERIRECTAL ABSCESS;  Surgeon: Lyndel Deward PARAS, MD;  Location: [facility];  Service: General;  Laterality: N/A;   INCISION AND DRAINAGE PERIRECTAL ABSCESS Right 07/24/2023   Procedure: IRRIGATION AND DEBRIDEMENT PERIRECTAL ABSCESS;  Surgeon: Ebbie Cough, MD;  Location: [facility] OR;  Service: General;  Laterality: Right;   LITHOTRIPSY  2010   MRI     x several in [place] - last one 02/27/22   nose fracture  2000   WISDOM TOOTH EXTRACTION     Social History:  reports that he has never smoked. He has never used smokeless tobacco. He reports that he does not drink alcohol and does not use drugs. Family History: No family history of prostate ca.    HOME MEDICATIONS: Allergies as of  03/04/2024   No Known Allergies      Medication List        Accurate as of March 04, 2024  7:51 AM. If you have any questions, ask your nurse or doctor.          amoxicillin -clavulanate 875-125 MG tablet Commonly known as: AUGMENTIN  Take 1 tablet by mouth 2 (two) times daily.   atorvastatin  20 MG tablet Commonly known as: LIPITOR Take 1 tablet (20 mg total) by mouth daily. What changed: Another medication with the same name was removed. Continue taking this medication, and follow the directions you see here. Changed by: Evangelynn Lochridge J Ashleyanne Hemmingway   b complex vitamins capsule Take 1 capsule by mouth at bedtime.   bisacodyl  5 MG EC tablet Generic drug: bisacodyl  Take 1 tablet (5 mg total) by mouth as directed.   colchicine 0.6 MG tablet Take by mouth.   Contour Next Test test strip Generic drug: glucose blood Use as instructed to check blood sugar once daily   Dulera  200-5 MCG/ACT Aero Generic drug: mometasone -formoterol  Inhale 2 puffs into the lungs 2 (two) times daily.   fluticasone -salmeterol 250-50 MCG/ACT Aepb Commonly known as: Advair  Diskus Inhale 1 puff into the lungs 2 (two) times daily.   HYDROcodone -acetaminophen  5-325 MG tablet Commonly known as: NORCO/VICODIN Take 1 tablet by mouth every 6 (six) hours as needed for moderate pain (pain score 4-6).   lisinopril -hydrochlorothiazide  20-25 MG tablet Commonly known as: ZESTORETIC  Take 1 tablet by mouth daily. What changed: when to take this   lisinopril -hydrochlorothiazide  20-12.5 MG tablet Commonly known as: ZESTORETIC  Take 1 tablet by mouth daily. What changed: Another medication with the same name was changed. Make sure you understand how and when to take each.   meloxicam 15 MG tablet Commonly known as: MOBIC Take 15 mg by mouth daily as needed for pain.   methocarbamol  750 MG tablet Commonly known as: ROBAXIN  Take 750 mg by mouth daily as needed for muscle spasms.   Microlet Lancets  Misc Use to check blood sugars once a day with Contour Next glucometer   NEEDLE (DISP) 22 G 22G X 1 Misc 1 Device by Does not apply route every 14 (fourteen) days.   predniSONE 10 MG tablet Commonly known as: DELTASONE Take by mouth.   SYRINGE-NEEDLE (DISP) 3 ML 25G X 5/8 3 ML Misc Use as directed every 14 days. (1 Device by Does not apply route every 14 (fourteen) days.)   tadalafil  5 MG tablet Commonly known as: CIALIS  Take 1-4 tablets (5-20 mg total) by mouth daily as needed.   testosterone  cypionate 200 MG/ML injection Commonly known as: DEPOTESTOSTERONE CYPIONATE Inject 0.5 mLs (100 mg total) into the muscle every 14 (fourteen) days.   testosterone  cypionate 200 MG/ML injection Commonly known as: DEPOTESTOSTERONE CYPIONATE Inject 0.5  mLs (100 mg total) into the muscle every 14 (fourteen) days.   testosterone  cypionate 200 MG/ML injection Commonly known as: DEPOTESTOSTERONE CYPIONATE Inject 0.5 mLs (100 mg total) into the muscle every 14 (fourteen) days.   traMADol  50 MG tablet Commonly known as: ULTRAM  Take 1 tablet (50 mg total) by mouth every 6 (six) hours as needed.   Vitamin D3 25 MCG (1000 UT) Caps Take 1,000 Units by mouth at bedtime.          OBJECTIVE:   Vital Signs: BP 120/80 (BP Location: Left Arm, Patient Position: Sitting, Cuff Size: Normal)   Pulse 64   Ht 5' 10 (1.778 m)   Wt 269 lb (122 kg)   SpO2 97%   BMI 38.60 kg/m   Wt Readings from Last 3 Encounters:  03/04/24 269 lb (122 kg)  08/29/23 274 lb (124.3 kg)  07/24/23 277 lb (125.6 kg)     Exam: General: Pt appears well and is in NAD  Lungs: Clear with good BS bilat   Heart: RRR   Extremities:  No edema   Neuro: MS is good with appropriate affect, pt is alert and Ox3    DM foot exam: 08/29/2023 The skin of the feet is intact without sores or ulcerations.  The pedal pulses are 2+ on right and 2+ on left. The sensation is decreased to a screening 5.07, 10 gram monofilament  bilaterally at the toes.      DATA REVIEWED:   Lab Results  Component Value Date   HGBA1C 6.3 (A) 03/04/2024   HGBA1C 6.5 (A) 08/29/2023   HGBA1C 6.7 (H) 02/25/2023     Latest Reference Range & Units 02/27/24 08:26  Sodium 135 - 146 mmol/L 140  Potassium 3.5 - 5.3 mmol/L 4.3  Chloride 98 - 110 mmol/L 101  CO2 20 - 32 mmol/L 27  Glucose 65 - 99 mg/dL 882 (H)  BUN 7 - 25 mg/dL 10  Creatinine 9.39 - 8.70 mg/dL 9.08  Calcium  8.6 - 10.3 mg/dL 9.8  BUN/Creatinine Ratio 6 - 22 (calc) SEE NOTE:  eGFR > OR = 60 mL/min/1.84m2 103  Total CHOL/HDL Ratio <5.0 (calc) 4.2  Cholesterol <200 mg/dL 847  HDL Cholesterol > OR = 40 mg/dL 36 (L)  LDL Cholesterol (Calc) mg/dL (calc) 92  MICROALB/CREAT RATIO <30 mg/g creat 4  Non-HDL Cholesterol (Calc) <130 mg/dL (calc) 883  Triglycerides <150 mg/dL 855    Latest Reference Range & Units 02/27/24 08:26  WBC 3.8 - 10.8 Thousand/uL 9.0  RBC 4.20 - 5.80 Million/uL 5.66  Hemoglobin 13.2 - 17.1 g/dL 83.0  HCT 61.4 - 49.9 % 50.5 (H)  MCV 80.0 - 100.0 fL 89.2  MCH 27.0 - 33.0 pg 29.9  MCHC 32.0 - 36.0 g/dL 66.4  RDW 88.9 - 84.9 % 13.5  Platelets 140 - 400 Thousand/uL 260  MPV 7.5 - 12.5 fL 10.3    Latest Reference Range & Units 02/27/24 08:26  Testosterone , Total, LC-MS-MS 250 - 1,100 ng/dL 328    Latest Reference Range & Units 02/27/24 08:26  Microalb, Ur mg/dL 0.9  MICROALB/CREAT RATIO <30 mg/g creat 4  Creatinine, Urine 20 - 320 mg/dL 791   In office BG 865 MGs/DL  MRI 11/17/7975  96/91/7977 8:20 AM EST   IMPRESSION:   1. No significant change since 08/25/2019.  2. 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  3. Normal MRI of the sella/pituitary gland without and with contrast. ASSESSMENT / PLAN / RECOMMENDATIONS:   1)  Type 2 Diabetes Mellitus, in remission with neuropathy - Most recent A1c of 6.5 %. Goal A1c < 7.0 %.    -A1c remains optimal - Patient will continue with lifestyle changes   EDUCATION /  INSTRUCTIONS: BG monitoring instructions: Patient is instructed to check his blood sugars 2 times a week, fasting .     2) Hypogonadotrophic hypogonadism:    -Testosterone  remains within normal range -Slight elevation in HCT, but hemoglobin and RBC normal   Medications  Continue testosterone  cypionate 200 mg/mL, take 100 mg  ( 0.5 mL) q. 14 days   3) Dyslipidemia :  -LDL acceptable but has increased from a year ago - Encourage compliance with statin therapy - Discussed importance of low-fat diet   Medication Continue atorvastatin  20 mg QHS    F/U in 6 months    Signed electronically by: Stefano Redgie Butts, MD  [facility]  [facility] [address] [place] [address] [place], [place] [address] Phone: [phone] FAX: [phone]   CC: Marvene Prentice SAUNDERS, FNP [address] [place] [address] Phone: [phone]  Fax: [phone]  Return to Endocrinology clinic as below: No future appointments.

## 2024-03-05 ENCOUNTER — Other Ambulatory Visit (HOSPITAL_BASED_OUTPATIENT_CLINIC_OR_DEPARTMENT_OTHER): Payer: Self-pay

## 2024-03-05 DIAGNOSIS — L821 Other seborrheic keratosis: Secondary | ICD-10-CM | POA: Diagnosis not present

## 2024-03-05 DIAGNOSIS — B360 Pityriasis versicolor: Secondary | ICD-10-CM | POA: Diagnosis not present

## 2024-03-05 DIAGNOSIS — L905 Scar conditions and fibrosis of skin: Secondary | ICD-10-CM | POA: Diagnosis not present

## 2024-03-05 DIAGNOSIS — L709 Acne, unspecified: Secondary | ICD-10-CM | POA: Diagnosis not present

## 2024-03-05 DIAGNOSIS — L814 Other melanin hyperpigmentation: Secondary | ICD-10-CM | POA: Diagnosis not present

## 2024-03-05 MED ORDER — KETOCONAZOLE 2 % EX SHAM
1.0000 | MEDICATED_SHAMPOO | Freq: Every day | CUTANEOUS | 11 refills | Status: AC
  Filled 2024-03-05: qty 120, 30d supply, fill #0
  Filled 2024-04-06: qty 120, 30d supply, fill #1

## 2024-03-16 ENCOUNTER — Other Ambulatory Visit (HOSPITAL_BASED_OUTPATIENT_CLINIC_OR_DEPARTMENT_OTHER): Payer: Self-pay

## 2024-03-16 MED FILL — Testosterone Cypionate IM Inj in Oil 200 MG/ML: INTRAMUSCULAR | 28 days supply | Qty: 2 | Fill #2 | Status: AC

## 2024-03-24 ENCOUNTER — Other Ambulatory Visit (HOSPITAL_BASED_OUTPATIENT_CLINIC_OR_DEPARTMENT_OTHER): Payer: Self-pay

## 2024-04-11 ENCOUNTER — Other Ambulatory Visit (HOSPITAL_BASED_OUTPATIENT_CLINIC_OR_DEPARTMENT_OTHER): Payer: Self-pay

## 2024-04-11 MED FILL — Testosterone Cypionate IM Inj in Oil 200 MG/ML: INTRAMUSCULAR | 28 days supply | Qty: 2 | Fill #3 | Status: CN

## 2024-04-16 ENCOUNTER — Other Ambulatory Visit (HOSPITAL_BASED_OUTPATIENT_CLINIC_OR_DEPARTMENT_OTHER): Payer: Self-pay

## 2024-04-16 ENCOUNTER — Other Ambulatory Visit: Payer: Self-pay

## 2024-04-16 MED FILL — Testosterone Cypionate IM Inj in Oil 200 MG/ML: INTRAMUSCULAR | 28 days supply | Qty: 2 | Fill #3 | Status: AC

## 2024-04-16 MED FILL — Testosterone Cypionate IM Inj in Oil 200 MG/ML: INTRAMUSCULAR | 28 days supply | Qty: 2 | Fill #3 | Status: CN

## 2024-05-06 ENCOUNTER — Other Ambulatory Visit: Payer: Self-pay

## 2024-05-26 ENCOUNTER — Other Ambulatory Visit (HOSPITAL_BASED_OUTPATIENT_CLINIC_OR_DEPARTMENT_OTHER): Payer: Self-pay

## 2024-05-26 MED FILL — Testosterone Cypionate IM Inj in Oil 200 MG/ML: INTRAMUSCULAR | 28 days supply | Qty: 2 | Fill #4 | Status: AC

## 2024-06-23 ENCOUNTER — Other Ambulatory Visit (HOSPITAL_BASED_OUTPATIENT_CLINIC_OR_DEPARTMENT_OTHER): Payer: Self-pay

## 2024-06-23 ENCOUNTER — Other Ambulatory Visit: Payer: Self-pay

## 2024-06-23 MED FILL — Testosterone Cypionate IM Inj in Oil 200 MG/ML: INTRAMUSCULAR | 28 days supply | Qty: 2 | Fill #5 | Status: AC

## 2024-07-01 ENCOUNTER — Other Ambulatory Visit (HOSPITAL_BASED_OUTPATIENT_CLINIC_OR_DEPARTMENT_OTHER): Payer: Self-pay

## 2024-07-01 MED ORDER — VALACYCLOVIR HCL 1 G PO TABS
1000.0000 mg | ORAL_TABLET | Freq: Three times a day (TID) | ORAL | 0 refills | Status: AC
  Filled 2024-07-01: qty 30, 10d supply, fill #0

## 2024-07-14 ENCOUNTER — Other Ambulatory Visit (HOSPITAL_BASED_OUTPATIENT_CLINIC_OR_DEPARTMENT_OTHER): Payer: Self-pay

## 2024-07-15 ENCOUNTER — Other Ambulatory Visit (HOSPITAL_BASED_OUTPATIENT_CLINIC_OR_DEPARTMENT_OTHER): Payer: Self-pay

## 2024-07-22 ENCOUNTER — Other Ambulatory Visit (HOSPITAL_BASED_OUTPATIENT_CLINIC_OR_DEPARTMENT_OTHER): Payer: Self-pay

## 2024-07-22 ENCOUNTER — Other Ambulatory Visit: Payer: Self-pay | Admitting: Internal Medicine

## 2024-07-23 ENCOUNTER — Other Ambulatory Visit: Payer: Self-pay

## 2024-07-23 ENCOUNTER — Other Ambulatory Visit (HOSPITAL_BASED_OUTPATIENT_CLINIC_OR_DEPARTMENT_OTHER): Payer: Self-pay

## 2024-07-23 MED ORDER — TESTOSTERONE CYPIONATE 200 MG/ML IM SOLN
100.0000 mg | INTRAMUSCULAR | 5 refills | Status: AC
  Filled 2024-07-23 (×2): qty 2, 28d supply, fill #0

## 2024-09-01 ENCOUNTER — Ambulatory Visit: Admitting: Internal Medicine
